# Patient Record
Sex: Female | Born: 1940
Health system: Southern US, Community
[De-identification: ages and names within clinical notes are randomized; demographics above are authoritative.]

## PROBLEM LIST (undated history)

## (undated) DIAGNOSIS — E669 Obesity, unspecified: Secondary | ICD-10-CM

## (undated) DIAGNOSIS — Z860100 Personal history of colon polyps, unspecified: Secondary | ICD-10-CM

## (undated) DIAGNOSIS — K5790 Diverticulosis of intestine, part unspecified, without perforation or abscess without bleeding: Secondary | ICD-10-CM

## (undated) DIAGNOSIS — E785 Hyperlipidemia, unspecified: Secondary | ICD-10-CM

## (undated) DIAGNOSIS — D122 Benign neoplasm of ascending colon: Secondary | ICD-10-CM

## (undated) DIAGNOSIS — K219 Gastro-esophageal reflux disease without esophagitis: Secondary | ICD-10-CM

## (undated) DIAGNOSIS — K635 Polyp of colon: Secondary | ICD-10-CM

## (undated) DIAGNOSIS — Z9289 Personal history of other medical treatment: Secondary | ICD-10-CM

## (undated) DIAGNOSIS — Z9989 Dependence on other enabling machines and devices: Secondary | ICD-10-CM

## (undated) DIAGNOSIS — E119 Type 2 diabetes mellitus without complications: Secondary | ICD-10-CM

## (undated) DIAGNOSIS — K602 Anal fissure, unspecified: Secondary | ICD-10-CM

## (undated) DIAGNOSIS — I10 Essential (primary) hypertension: Secondary | ICD-10-CM

## (undated) DIAGNOSIS — K5730 Diverticulosis of large intestine without perforation or abscess without bleeding: Secondary | ICD-10-CM

## (undated) DIAGNOSIS — N390 Urinary tract infection, site not specified: Secondary | ICD-10-CM

## (undated) DIAGNOSIS — G4733 Obstructive sleep apnea (adult) (pediatric): Secondary | ICD-10-CM

## (undated) DIAGNOSIS — K649 Unspecified hemorrhoids: Secondary | ICD-10-CM

## (undated) DIAGNOSIS — R739 Hyperglycemia, unspecified: Secondary | ICD-10-CM

## (undated) DIAGNOSIS — G4730 Sleep apnea, unspecified: Secondary | ICD-10-CM

## (undated) DIAGNOSIS — M1990 Unspecified osteoarthritis, unspecified site: Secondary | ICD-10-CM

## (undated) DIAGNOSIS — E039 Hypothyroidism, unspecified: Secondary | ICD-10-CM

## (undated) DIAGNOSIS — K439 Ventral hernia without obstruction or gangrene: Secondary | ICD-10-CM

## (undated) HISTORY — DX: Benign neoplasm of ascending colon: D12.2

## (undated) HISTORY — DX: Personal history of colon polyps, unspecified: Z86.0100

## (undated) HISTORY — DX: Hyperglycemia, unspecified: R73.9

## (undated) HISTORY — DX: Obstructive sleep apnea (adult) (pediatric): G47.33

## (undated) HISTORY — DX: Obesity, unspecified: E66.9

## (undated) HISTORY — DX: Hyperlipidemia, unspecified: E78.5

## (undated) HISTORY — DX: Gastro-esophageal reflux disease without esophagitis: K21.9

## (undated) HISTORY — DX: Anal fissure, unspecified: K60.2

## (undated) HISTORY — DX: Polyp of colon: K63.5

## (undated) HISTORY — DX: Dependence on other enabling machines and devices: Z99.89

## (undated) HISTORY — DX: Urinary tract infection, site not specified: N39.0

## (undated) HISTORY — PX: COLONOSCOPY: SHX174

## (undated) HISTORY — DX: Type 2 diabetes mellitus without complications: E11.9

## (undated) HISTORY — DX: Diverticulosis of large intestine without perforation or abscess without bleeding: K57.30

## (undated) HISTORY — DX: Personal history of colonic polyps: Z86.010

## (undated) HISTORY — PX: TOTAL HIP ARTHROPLASTY: SHX124

## (undated) HISTORY — PX: JOINT REPLACEMENT: SHX530

## (undated) HISTORY — DX: Diverticulosis of intestine, part unspecified, without perforation or abscess without bleeding: K57.90

## (undated) HISTORY — DX: Unspecified osteoarthritis, unspecified site: M19.90

## (undated) HISTORY — DX: Essential (primary) hypertension: I10

## (undated) HISTORY — PX: TUBAL LIGATION: SHX77

---

## 2015-01-15 ENCOUNTER — Ambulatory Visit (INDEPENDENT_AMBULATORY_CARE_PROVIDER_SITE_OTHER): Payer: Medicare Other | Admitting: Family Medicine

## 2015-01-15 ENCOUNTER — Encounter: Payer: Self-pay | Admitting: Family Medicine

## 2015-01-15 VITALS — BP 160/75 | HR 70 | Temp 98.1°F | Resp 16 | Ht 63.0 in | Wt 269.0 lb

## 2015-01-15 DIAGNOSIS — I159 Secondary hypertension, unspecified: Secondary | ICD-10-CM

## 2015-01-15 DIAGNOSIS — M1990 Unspecified osteoarthritis, unspecified site: Secondary | ICD-10-CM | POA: Diagnosis not present

## 2015-01-15 DIAGNOSIS — E785 Hyperlipidemia, unspecified: Secondary | ICD-10-CM | POA: Diagnosis not present

## 2015-01-15 DIAGNOSIS — I10 Essential (primary) hypertension: Secondary | ICD-10-CM

## 2015-01-15 DIAGNOSIS — G4733 Obstructive sleep apnea (adult) (pediatric): Secondary | ICD-10-CM

## 2015-01-15 DIAGNOSIS — E669 Obesity, unspecified: Secondary | ICD-10-CM | POA: Diagnosis not present

## 2015-01-15 DIAGNOSIS — Z6841 Body Mass Index (BMI) 40.0 and over, adult: Secondary | ICD-10-CM

## 2015-01-15 DIAGNOSIS — R739 Hyperglycemia, unspecified: Secondary | ICD-10-CM

## 2015-01-15 DIAGNOSIS — R7309 Other abnormal glucose: Secondary | ICD-10-CM | POA: Diagnosis not present

## 2015-01-15 DIAGNOSIS — E1169 Type 2 diabetes mellitus with other specified complication: Secondary | ICD-10-CM | POA: Insufficient documentation

## 2015-01-15 DIAGNOSIS — K219 Gastro-esophageal reflux disease without esophagitis: Secondary | ICD-10-CM | POA: Diagnosis not present

## 2015-01-15 HISTORY — DX: Obesity, unspecified: E66.9

## 2015-01-15 HISTORY — DX: Hyperglycemia, unspecified: R73.9

## 2015-01-15 HISTORY — DX: Essential (primary) hypertension: I10

## 2015-01-15 HISTORY — DX: Unspecified osteoarthritis, unspecified site: M19.90

## 2015-01-15 MED ORDER — AMLODIPINE BESYLATE 10 MG PO TABS: 10.0000 mg | ORAL_TABLET | Freq: Every day | ORAL | Status: DC

## 2015-01-15 MED ORDER — CARVEDILOL 25 MG PO TABS: 25.0000 mg | ORAL_TABLET | Freq: Two times a day (BID) | ORAL | Status: DC

## 2015-01-15 MED ORDER — VALSARTAN-HYDROCHLOROTHIAZIDE 320-12.5 MG PO TABS: 1.0000 | ORAL_TABLET | Freq: Every day | ORAL | Status: DC

## 2015-01-15 MED ORDER — OMEPRAZOLE 20 MG PO CPDR: 20.0000 mg | DELAYED_RELEASE_CAPSULE | Freq: Every day | ORAL | Status: DC

## 2015-01-15 MED ORDER — ATORVASTATIN CALCIUM 20 MG PO TABS: 20.0000 mg | ORAL_TABLET | Freq: Every day | ORAL | Status: DC

## 2015-01-15 MED ORDER — DOXAZOSIN MESYLATE 8 MG PO TABS: 8.0000 mg | ORAL_TABLET | Freq: Every day | ORAL | Status: DC

## 2015-01-15 NOTE — Progress Notes (Signed)
Name: Taylor Shaw   MRN: [id number]    DOB: November 26, 1940   Date:01/15/2015       Progress Note  Subjective  Chief Complaint  Chief Complaint  Patient presents with  . Establish Care    HPI  Here to establish care.  Formerly lived in [place].  Has HBP, elevated lipids, elevated blood sugar, obesity.  No c/o.  Taking all meds. No problem-specific assessment & plan notes found for this encounter.   Past Medical History  Diagnosis Date  . Hypertension   . Arthritis   . GERD (gastroesophageal reflux disease)   . Hyperlipidemia     Past Surgical History  Procedure Laterality Date  . Total hip arthroplasty Bilateral   . Tubal ligation    . Colonoscopy      Family History  Problem Relation Age of Onset  . Dementia Mother   . Hypertension Mother   . Arthritis Mother   . Cancer Father     lung  . Cancer Daughter     breast    Social History   Social History  . Marital Status: Divorced    Spouse Name: N/A  . Number of Children: N/A  . Years of Education: N/A   Occupational History  . Not on file.   Social History Main Topics  . Smoking status: Never Smoker   . Smokeless tobacco: Never Used  . Alcohol Use: No  . Drug Use: No  . Sexual Activity: Not on file   Other Topics Concern  . Not on file   Social History Narrative  . No narrative on file     Current outpatient prescriptions:  .  amLODipine (NORVASC) 10 MG tablet, Take 1 tablet (10 mg total) by mouth daily., Disp: 90 tablet, Rfl: 3 .  atorvastatin (LIPITOR) 20 MG tablet, Take 1 tablet (20 mg total) by mouth daily., Disp: 90 tablet, Rfl: 3 .  carvedilol (COREG) 25 MG tablet, Take 1 tablet (25 mg total) by mouth 2 (two) times daily with a meal., Disp: 180 tablet, Rfl: 3 .  Cholecalciferol (VITAMIN D) 2000 UNITS CAPS, Take 1 capsule by mouth daily., Disp: , Rfl:  .  docusate sodium (COLACE) 100 MG capsule, Take 100 mg by mouth at bedtime., Disp: , Rfl:  .  doxazosin (CARDURA) 8 MG tablet, Take 1  tablet (8 mg total) by mouth daily., Disp: 90 tablet, Rfl: 3 .  Omega-3 Fatty Acids (FISH OIL) 1000 MG CAPS, Take 1 capsule by mouth daily., Disp: , Rfl:  .  omeprazole (PRILOSEC) 20 MG capsule, Take 1 capsule (20 mg total) by mouth daily., Disp: 90 capsule, Rfl: 3 .  valsartan-hydrochlorothiazide (DIOVAN-HCT) 320-12.5 MG per tablet, Take 1 tablet by mouth daily., Disp: 90 tablet, Rfl: 3  No Known Allergies   Review of Systems  Constitutional: Negative for fever, chills, weight loss and malaise/fatigue.  HENT: Negative for hearing loss.   Eyes: Negative for blurred vision and double vision.  Respiratory: Negative for cough, sputum production, shortness of breath and wheezing.   Cardiovascular: Negative for chest pain, palpitations, orthopnea and leg swelling.  Gastrointestinal: Negative for heartburn, nausea, vomiting, abdominal pain, diarrhea and blood in stool.  Genitourinary: Negative for dysuria, urgency and frequency.  Musculoskeletal: Negative for myalgias and joint pain.  Skin: Negative for rash.  Neurological: Negative for dizziness, tingling, sensory change, focal weakness, weakness and headaches.      Objective  Filed Vitals:   01/15/15 0939 01/15/15 1037  BP: 124/70 160/75  Pulse:  70   Temp: 98.1 F (36.7 C)   TempSrc: Oral   Resp: 16   Height: 5\' 3"  (1.6 m)   Weight: 269 lb (122.018 kg)     Physical Exam  Constitutional: She is oriented to person, place, and time and well-developed, well-nourished, and in no distress. No distress.  HENT:  Head: Normocephalic and atraumatic.  Eyes: Conjunctivae and EOM are normal. Pupils are equal, round, and reactive to light. No scleral icterus.  Neck: Normal range of motion. Neck supple. Carotid bruit is not present. No thyromegaly present.  Cardiovascular: Normal rate, regular rhythm, normal heart sounds and intact distal pulses.  Exam reveals no gallop and no friction rub.   No murmur heard. Pulmonary/Chest: Breath sounds  normal. No respiratory distress. She has no wheezes. She has no rales.  Abdominal: Soft. Bowel sounds are normal. She exhibits no distension, no abdominal bruit and no mass. There is no tenderness.  Musculoskeletal: She exhibits edema (trace pedal edema bilaterally).  Lymphadenopathy:    She has no cervical adenopathy.  Neurological: She is alert and oriented to person, place, and time.  Skin: Skin is warm and dry.       No results found for this or any previous visit (from the past 2160 hour(s)).   Assessment & Plan  Problem List Items Addressed This Visit      Cardiovascular and Mediastinum   Hypertension - Primary   Relevant Medications   amLODipine (NORVASC) 10 MG tablet   atorvastatin (LIPITOR) 20 MG tablet   carvedilol (COREG) 25 MG tablet   doxazosin (CARDURA) 8 MG tablet   valsartan-hydrochlorothiazide (DIOVAN-HCT) 320-12.5 MG per tablet   Other Relevant Orders   Comprehensive Metabolic Panel (CMET)     Musculoskeletal and Integument   Arthritis     Other   Hyperlipidemia   Relevant Medications   amLODipine (NORVASC) 10 MG tablet   atorvastatin (LIPITOR) 20 MG tablet   carvedilol (COREG) 25 MG tablet   doxazosin (CARDURA) 8 MG tablet   valsartan-hydrochlorothiazide (DIOVAN-HCT) 320-12.5 MG per tablet   Obesity   Relevant Orders   Lipid Profile   TSH   Elevated blood sugar   Relevant Orders   HgB A1c    Other Visit Diagnoses    Gastroesophageal reflux disease without esophagitis        Relevant Medications    docusate sodium (COLACE) 100 MG capsule    omeprazole (PRILOSEC) 20 MG capsule    Sleep apnea, obstructive           Meds ordered this encounter  Medications  . DISCONTD: atorvastatin (LIPITOR) 20 MG tablet    Sig: Take 20 mg by mouth daily.  Marland Kitchen DISCONTD: carvedilol (COREG) 25 MG tablet    Sig: Take 25 mg by mouth 2 (two) times daily with a meal.  . DISCONTD: amLODipine (NORVASC) 10 MG tablet    Sig: Take 10 mg by mouth daily.  Marland Kitchen DISCONTD:  valsartan-hydrochlorothiazide (DIOVAN-HCT) 320-12.5 MG per tablet    Sig: Take 1 tablet by mouth daily.  . Cholecalciferol (VITAMIN D) 2000 UNITS CAPS    Sig: Take 1 capsule by mouth daily.  Marland Kitchen DISCONTD: omeprazole (PRILOSEC) 20 MG capsule    Sig: Take 20 mg by mouth daily.  . Omega-3 Fatty Acids (FISH OIL) 1000 MG CAPS    Sig: Take 1 capsule by mouth daily.  Marland Kitchen DISCONTD: doxazosin (CARDURA) 8 MG tablet    Sig: Take 8 mg by mouth daily.  Marland Kitchen docusate sodium (COLACE)  100 MG capsule    Sig: Take 100 mg by mouth at bedtime.  Marland Kitchen amLODipine (NORVASC) 10 MG tablet    Sig: Take 1 tablet (10 mg total) by mouth daily.    Dispense:  90 tablet    Refill:  3  . atorvastatin (LIPITOR) 20 MG tablet    Sig: Take 1 tablet (20 mg total) by mouth daily.    Dispense:  90 tablet    Refill:  3  . carvedilol (COREG) 25 MG tablet    Sig: Take 1 tablet (25 mg total) by mouth 2 (two) times daily with a meal.    Dispense:  180 tablet    Refill:  3  . doxazosin (CARDURA) 8 MG tablet    Sig: Take 1 tablet (8 mg total) by mouth daily.    Dispense:  90 tablet    Refill:  3  . omeprazole (PRILOSEC) 20 MG capsule    Sig: Take 1 capsule (20 mg total) by mouth daily.    Dispense:  90 capsule    Refill:  3  . valsartan-hydrochlorothiazide (DIOVAN-HCT) 320-12.5 MG per tablet    Sig: Take 1 tablet by mouth daily.    Dispense:  90 tablet    Refill:  3   1. Secondary hypertension, unspecified  - Comprehensive Metabolic Panel (CMET) - amLODipine (NORVASC) 10 MG tablet; Take 1 tablet (10 mg total) by mouth daily.  Dispense: 90 tablet; Refill: 3 - carvedilol (COREG) 25 MG tablet; Take 1 tablet (25 mg total) by mouth 2 (two) times daily with a meal.  Dispense: 180 tablet; Refill: 3 - doxazosin (CARDURA) 8 MG tablet; Take 1 tablet (8 mg total) by mouth daily.  Dispense: 90 tablet; Refill: 3 - valsartan-hydrochlorothiazide (DIOVAN-HCT) 320-12.5 MG per tablet; Take 1 tablet by mouth daily.  Dispense: 90 tablet; Refill:  3  2. Arthritis   3. Hyperlipidemia  - atorvastatin (LIPITOR) 20 MG tablet; Take 1 tablet (20 mg total) by mouth daily.  Dispense: 90 tablet; Refill: 3  4. Obesity  - Lipid Profile - TSH  5. Elevated blood sugar  - HgB A1c  6. Gastroesophageal reflux disease without esophagitis  - omeprazole (PRILOSEC) 20 MG capsule; Take 1 capsule (20 mg total) by mouth daily.  Dispense: 90 capsule; Refill: 3  7. Sleep apnea, obstructive

## 2015-01-15 NOTE — Patient Instructions (Signed)
Discussed weight loss and exercise.  Restart Amlodipine and Omeprazole (out olf each x 1 week).

## 2015-01-19 DIAGNOSIS — I159 Secondary hypertension, unspecified: Secondary | ICD-10-CM | POA: Diagnosis not present

## 2015-01-19 DIAGNOSIS — R7309 Other abnormal glucose: Secondary | ICD-10-CM | POA: Diagnosis not present

## 2015-01-19 DIAGNOSIS — E669 Obesity, unspecified: Secondary | ICD-10-CM | POA: Diagnosis not present

## 2015-01-20 LAB — COMPREHENSIVE METABOLIC PANEL
A/G RATIO: 1.3 (ref 1.1–2.5)
ALBUMIN: 3.8 g/dL (ref 3.5–4.8)
ALT: 8 IU/L (ref 0–32)
AST: 14 IU/L (ref 0–40)
Alkaline Phosphatase: 72 IU/L (ref 39–117)
BILIRUBIN TOTAL: 0.7 mg/dL (ref 0.0–1.2)
BUN / CREAT RATIO: 12 (ref 11–26)
BUN: 9 mg/dL (ref 8–27)
CO2: 28 mmol/L (ref 18–29)
Calcium: 8.9 mg/dL (ref 8.7–10.3)
Chloride: 100 mmol/L (ref 97–108)
Creatinine, Ser: 0.73 mg/dL (ref 0.57–1.00)
GFR calc non Af Amer: 83 mL/min/{1.73_m2} (ref >59)
GFR, EST AFRICAN AMERICAN: 95 mL/min/{1.73_m2} (ref >59)
Globulin, Total: 2.9 g/dL (ref 1.5–4.5)
Glucose: 112 mg/dL — ABNORMAL HIGH (ref 65–99)
POTASSIUM: 4 mmol/L (ref 3.5–5.2)
Sodium: 143 mmol/L (ref 134–144)
TOTAL PROTEIN: 6.7 g/dL (ref 6.0–8.5)

## 2015-01-20 LAB — LIPID PANEL
Chol/HDL Ratio: 2.1 ratio units (ref 0.0–4.4)
Cholesterol, Total: 103 mg/dL (ref 100–199)
HDL: 50 mg/dL (ref >39)
LDL Calculated: 42 mg/dL (ref 0–99)
Triglycerides: 54 mg/dL (ref 0–149)
VLDL CHOLESTEROL CAL: 11 mg/dL (ref 5–40)

## 2015-01-20 LAB — HEMOGLOBIN A1C
ESTIMATED AVERAGE GLUCOSE: 146 mg/dL
Hgb A1c MFr Bld: 6.7 % — ABNORMAL HIGH (ref 4.8–5.6)

## 2015-01-20 LAB — TSH: TSH: 2.65 u[IU]/mL (ref 0.450–4.500)

## 2015-02-19 ENCOUNTER — Ambulatory Visit (INDEPENDENT_AMBULATORY_CARE_PROVIDER_SITE_OTHER): Payer: Medicare Other | Admitting: Family Medicine

## 2015-02-19 ENCOUNTER — Encounter: Payer: Self-pay | Admitting: Family Medicine

## 2015-02-19 VITALS — BP 124/75 | HR 66 | Temp 98.0°F | Resp 16 | Ht 63.0 in | Wt 268.8 lb

## 2015-02-19 DIAGNOSIS — E785 Hyperlipidemia, unspecified: Secondary | ICD-10-CM

## 2015-02-19 DIAGNOSIS — I10 Essential (primary) hypertension: Secondary | ICD-10-CM | POA: Diagnosis not present

## 2015-02-19 DIAGNOSIS — E669 Obesity, unspecified: Secondary | ICD-10-CM

## 2015-02-19 DIAGNOSIS — Z23 Encounter for immunization: Secondary | ICD-10-CM

## 2015-02-19 DIAGNOSIS — E119 Type 2 diabetes mellitus without complications: Secondary | ICD-10-CM

## 2015-02-19 DIAGNOSIS — E118 Type 2 diabetes mellitus with unspecified complications: Secondary | ICD-10-CM | POA: Insufficient documentation

## 2015-02-19 HISTORY — DX: Type 2 diabetes mellitus without complications: E11.9

## 2015-02-19 MED ORDER — METFORMIN HCL 500 MG PO TABS: 500.0000 mg | ORAL_TABLET | Freq: Every day | ORAL | Status: DC

## 2015-02-19 MED ORDER — ATORVASTATIN CALCIUM 10 MG PO TABS: 10.0000 mg | ORAL_TABLET | Freq: Every day | ORAL | Status: DC

## 2015-02-19 NOTE — Patient Instructions (Addendum)
Continue to take current meds except where changed.  To get HD flu shot today.

## 2015-02-19 NOTE — Progress Notes (Signed)
Name: Taylor Shaw   MRN: [id number]    DOB: Jul 06, 1940   Date:02/19/2015       Progress Note  Subjective  Chief Complaint  Chief Complaint  Patient presents with  . Hypertension    1 month follow up    Hypertension Pertinent negatives include no blurred vision, chest pain, headaches, malaise/fatigue, orthopnea, palpitations or shortness of breath.  Here for f/u of HBP.  Taking all meds and feeling overall well.  Only lost minimal weight.  Labs indicated A1c of 6.7.  She is diabetic without sig. Weight loss.    Her lipids are very low.   No problem-specific assessment & plan notes found for this encounter.   Past Medical History  Diagnosis Date  . Hypertension   . Arthritis   . GERD (gastroesophageal reflux disease)   . Hyperlipidemia     Social History  Substance Use Topics  . Smoking status: Never Smoker   . Smokeless tobacco: Never Used  . Alcohol Use: No     Current outpatient prescriptions:  .  amLODipine (NORVASC) 10 MG tablet, Take 1 tablet (10 mg total) by mouth daily., Disp: 90 tablet, Rfl: 3 .  atorvastatin (LIPITOR) 20 MG tablet, Take 1 tablet (20 mg total) by mouth daily., Disp: 90 tablet, Rfl: 3 .  carvedilol (COREG) 25 MG tablet, Take 1 tablet (25 mg total) by mouth 2 (two) times daily with a meal., Disp: 180 tablet, Rfl: 3 .  Cholecalciferol (VITAMIN D) 2000 UNITS CAPS, Take 1 capsule by mouth daily., Disp: , Rfl:  .  docusate sodium (COLACE) 100 MG capsule, Take 100 mg by mouth at bedtime., Disp: , Rfl:  .  doxazosin (CARDURA) 8 MG tablet, Take 1 tablet (8 mg total) by mouth daily., Disp: 90 tablet, Rfl: 3 .  Omega-3 Fatty Acids (FISH OIL) 1000 MG CAPS, Take 1 capsule by mouth daily., Disp: , Rfl:  .  omeprazole (PRILOSEC) 20 MG capsule, Take 1 capsule (20 mg total) by mouth daily., Disp: 90 capsule, Rfl: 3 .  valsartan-hydrochlorothiazide (DIOVAN-HCT) 320-12.5 MG per tablet, Take 1 tablet by mouth daily., Disp: 90 tablet, Rfl: 3  No Known  Allergies  Review of Systems  Constitutional: Negative for fever, chills, weight loss and malaise/fatigue.  HENT: Negative for hearing loss.   Eyes: Negative for blurred vision and double vision.  Respiratory: Negative for cough, sputum production, shortness of breath and wheezing.   Cardiovascular: Negative for chest pain, palpitations, orthopnea and leg swelling.  Gastrointestinal: Negative for heartburn, abdominal pain and blood in stool.  Genitourinary: Negative for dysuria, urgency and frequency.  Musculoskeletal: Negative for myalgias and joint pain.  Skin: Negative for rash.  Neurological: Negative for dizziness, sensory change, focal weakness, weakness and headaches.      Objective  Filed Vitals:   02/19/15 0928  BP: 124/75  Pulse: 51  Temp: 98 F (36.7 C)  TempSrc: Oral  Resp: 16  Height: 5\' 3"  (1.6 m)  Weight: 268 lb 12.8 oz (121.927 kg)     Physical Exam  Constitutional: She is oriented to person, place, and time and well-developed, well-nourished, and in no distress. No distress.  HENT:  Head: Normocephalic and atraumatic.  Eyes: Conjunctivae and EOM are normal. Pupils are equal, round, and reactive to light. No scleral icterus.  Neck: Normal range of motion. Neck supple. Carotid bruit is not present. No thyromegaly present.  Cardiovascular: Normal rate, regular rhythm, normal heart sounds and intact distal pulses.  Exam reveals no gallop and  no friction rub.   No murmur heard. Pulmonary/Chest: Effort normal and breath sounds normal. No respiratory distress. She has no wheezes. She has no rales.  Abdominal: Soft. Bowel sounds are normal. She exhibits no distension, no abdominal bruit and no mass. There is no tenderness.  Musculoskeletal: She exhibits edema (trace pedal edema bilaterally.).  Lymphadenopathy:    She has no cervical adenopathy.  Neurological: She is alert and oriented to person, place, and time.      Recent Results (from the past 2160  hour(s))  Comprehensive Metabolic Panel (CMET)     Status: Abnormal   Collection Time: 01/19/15  8:11 AM  Result Value Ref Range   Glucose 112 (H) 65 - 99 mg/dL   BUN 9 8 - 27 mg/dL   Creatinine, Ser 0.73 0.57 - 1.00 mg/dL   GFR calc non Af Amer 83 >59 mL/min/1.73   GFR calc Af Amer 95 >59 mL/min/1.73   BUN/Creatinine Ratio 12 11 - 26   Sodium 143 134 - 144 mmol/L   Potassium 4.0 3.5 - 5.2 mmol/L   Chloride 100 97 - 108 mmol/L   CO2 28 18 - 29 mmol/L   Calcium 8.9 8.7 - 10.3 mg/dL   Total Protein 6.7 6.0 - 8.5 g/dL   Albumin 3.8 3.5 - 4.8 g/dL   Globulin, Total 2.9 1.5 - 4.5 g/dL   Albumin/Globulin Ratio 1.3 1.1 - 2.5   Bilirubin Total 0.7 0.0 - 1.2 mg/dL   Alkaline Phosphatase 72 39 - 117 IU/L   AST 14 0 - 40 IU/L   ALT 8 0 - 32 IU/L  Lipid Profile     Status: None   Collection Time: 01/19/15  8:11 AM  Result Value Ref Range   Cholesterol, Total 103 100 - 199 mg/dL   Triglycerides 54 0 - 149 mg/dL   HDL 50 >39 mg/dL    Comment: According to ATP-III Guidelines, HDL-C >59 mg/dL is considered a negative risk factor for CHD.    VLDL Cholesterol Cal 11 5 - 40 mg/dL   LDL Calculated 42 0 - 99 mg/dL   Chol/HDL Ratio 2.1 0.0 - 4.4 ratio units    Comment:                                   T. Chol/HDL Ratio                                             Men  Women                               1/2 Avg.Risk  3.4    3.3                                   Avg.Risk  5.0    4.4                                2X Avg.Risk  9.6    7.1  3X Avg.Risk 23.4   11.0   HgB A1c     Status: Abnormal   Collection Time: 01/19/15  8:11 AM  Result Value Ref Range   Hgb A1c MFr Bld 6.7 (H) 4.8 - 5.6 %    Comment:          Pre-diabetes: 5.7 - 6.4          Diabetes: >6.4          Glycemic control for adults with diabetes: <7.0    Est. average glucose Bld gHb Est-mCnc 146 mg/dL  TSH     Status: None   Collection Time: 01/19/15  8:11 AM  Result Value Ref Range   TSH 2.650  0.450 - 4.500 uIU/mL     Assessment & Plan  1. Essential hypertension -continue current meds 2. Hyperlipidemia  - atorvastatin (LIPITOR) 10 MG tablet; Take 1 tablet (10 mg total) by mouth daily.  Dispense: 90 tablet; Refill: 3  3. Obesity -discussed calorie reduction and weight loss. 4. Type 2 diabetes mellitus without complication  - metFORMIN (GLUCOPHAGE) 500 MG tablet; Take 1 tablet (500 mg total) by mouth daily with breakfast.  Dispense: 30 tablet; Refill: 6

## 2015-03-27 ENCOUNTER — Emergency Department: Payer: Medicare Other

## 2015-03-27 ENCOUNTER — Emergency Department
Admission: EM | Admit: 2015-03-27 | Discharge: 2015-03-27 | Disposition: A | Discharge status: Home / Self Care | Payer: Medicare Other | Attending: Emergency Medicine | Admitting: Emergency Medicine

## 2015-03-27 ENCOUNTER — Encounter: Payer: Self-pay | Admitting: Emergency Medicine

## 2015-03-27 DIAGNOSIS — Z7984 Long term (current) use of oral hypoglycemic drugs: Secondary | ICD-10-CM | POA: Diagnosis not present

## 2015-03-27 DIAGNOSIS — M5416 Radiculopathy, lumbar region: Secondary | ICD-10-CM | POA: Diagnosis not present

## 2015-03-27 DIAGNOSIS — M4726 Other spondylosis with radiculopathy, lumbar region: Secondary | ICD-10-CM | POA: Insufficient documentation

## 2015-03-27 DIAGNOSIS — I10 Essential (primary) hypertension: Secondary | ICD-10-CM | POA: Insufficient documentation

## 2015-03-27 DIAGNOSIS — M1991 Primary osteoarthritis, unspecified site: Secondary | ICD-10-CM | POA: Diagnosis not present

## 2015-03-27 DIAGNOSIS — Z79899 Other long term (current) drug therapy: Secondary | ICD-10-CM | POA: Diagnosis not present

## 2015-03-27 DIAGNOSIS — M25551 Pain in right hip: Secondary | ICD-10-CM | POA: Diagnosis not present

## 2015-03-27 DIAGNOSIS — M5136 Other intervertebral disc degeneration, lumbar region: Secondary | ICD-10-CM | POA: Diagnosis not present

## 2015-03-27 DIAGNOSIS — M545 Low back pain: Secondary | ICD-10-CM | POA: Diagnosis not present

## 2015-03-27 DIAGNOSIS — M549 Dorsalgia, unspecified: Secondary | ICD-10-CM | POA: Diagnosis not present

## 2015-03-27 DIAGNOSIS — M5116 Intervertebral disc disorders with radiculopathy, lumbar region: Secondary | ICD-10-CM | POA: Diagnosis not present

## 2015-03-27 MED ORDER — OXYCODONE-ACETAMINOPHEN 5-325 MG PO TABS
1.0000 | ORAL_TABLET | Freq: Once | ORAL | Status: AC
  Administered 2015-03-27: 1 via ORAL
  Filled 2015-03-27: qty 1

## 2015-03-27 MED ORDER — PREDNISONE 10 MG PO TABS: ORAL_TABLET | ORAL | Status: DC

## 2015-03-27 MED ORDER — OXYCODONE-ACETAMINOPHEN 5-325 MG PO TABS: 1.0000 | ORAL_TABLET | ORAL | Status: AC | PRN

## 2015-03-27 MED ORDER — PREDNISONE 20 MG PO TABS
60.0000 mg | ORAL_TABLET | Freq: Once | ORAL | Status: AC
  Administered 2015-03-27: 60 mg via ORAL
  Filled 2015-03-27: qty 3

## 2015-03-27 MED ORDER — MORPHINE SULFATE (PF) 4 MG/ML IV SOLN
4.0000 mg | Freq: Once | INTRAVENOUS | Status: AC
  Administered 2015-03-27: 4 mg via INTRAMUSCULAR
  Filled 2015-03-27: qty 1

## 2015-03-27 NOTE — Discharge Instructions (Signed)
Degenerative Disk Disease Degenerative disk disease is a condition caused by the changes that occur in spinal disks as you grow older. Spinal disks are soft and compressible disks located between the bones of your spine (vertebrae). These disks act like shock absorbers. Degenerative disk disease can affect the whole spine. However, the neck and lower back are most commonly affected. Many changes can occur in the spinal disks with aging, such as:  The spinal disks may dry and shrink.  Small tears may occur in the tough, outer covering of the disk (annulus).  The disk space may become smaller due to loss of water.  Abnormal growths in the bone (spurs) may occur. This can put pressure on the nerve roots exiting the spinal canal, causing pain.  The spinal canal may become narrowed. RISK FACTORS   Being overweight.  Having a family history of degenerative disk disease.  Smoking.  There is increased risk if you are doing heavy lifting or have a sudden injury. SIGNS AND SYMPTOMS  Symptoms vary from person to person and may include:  Pain that varies in intensity. Some people have no pain, while others have severe pain. The location of the pain depends on the part of your backbone that is affected.  You will have neck or arm pain if a disk in the neck area is affected.  You will have pain in your back, buttocks, or legs if a disk in the lower back is affected.  Pain that becomes worse while bending, reaching up, or with twisting movements.  Pain that may start gradually and then get worse as time passes. It may also start after a major or minor injury.  Numbness or tingling in the arms or legs. DIAGNOSIS  Your health care provider will ask you about your symptoms and about activities or habits that may cause the pain. He or she may also ask about any injuries, diseases, or treatments you have had. Your health care provider will examine you to check for the range of movement that is  possible in the affected area, to check for strength in your extremities, and to check for sensation in the areas of the arms and legs supplied by different nerve roots. You may also have:   An X-ray of the spine.  Other imaging tests, such as MRI. TREATMENT  Your health care provider will advise you on the best plan for treatment. Treatment may include:  Medicines.  Rehabilitation exercises. HOME CARE INSTRUCTIONS   Follow proper lifting and walking techniques as advised by your health care provider.  Maintain good posture.  Exercise regularly as advised by your health care provider.  Perform relaxation exercises.  Change your sitting, standing, and sleeping habits as advised by your health care provider.  Change positions frequently.  Lose weight or maintain a healthy weight as advised by your health care provider.  Do not use any tobacco products, including cigarettes, chewing tobacco, or electronic cigarettes. If you need help quitting, ask your health care provider.  Wear supportive footwear.  Take medicines only as directed by your health care provider. SEEK MEDICAL CARE IF:   Your pain does not go away within 1-4 weeks.  You have significant appetite or weight loss. SEEK IMMEDIATE MEDICAL CARE IF:   Your pain is severe.  You notice weakness in your arms, hands, or legs.  You begin to lose control of your bladder or bowel movements.  You have fevers or night sweats. MAKE SURE YOU:   Understand these  instructions.  Will watch your condition.  Will get help right away if you are not doing well or get worse.   This information is not intended to replace advice given to you by your health care provider. Make sure you discuss any questions you have with your health care provider.   Document Released: 03/19/2007 Document Revised: 06/12/2014 Document Reviewed: 09/23/2013 Elsevier Interactive Patient Education 2016 [employer]  Lumbosacral  Radiculopathy Lumbosacral radiculopathy is a condition that involves the spinal nerves and nerve roots in the low back and bottom of the spine. The condition develops when these nerves and nerve roots move out of place or become inflamed and cause symptoms. CAUSES This condition may be caused by:  Pressure from a disk that bulges out of place (herniated disk). A disk is a plate of cartilage that separates bones in the spine.  Disk degeneration.  A narrowing of the bones of the lower back (spinal stenosis).  A tumor.  An infection.  An injury that places sudden pressure on the disks that cushion the bones of your lower spine. RISK FACTORS This condition is more likely to develop in:  Males aged 30-50 years.  Females aged 29-60 years.  People who lift improperly.  People who are overweight or live a sedentary lifestyle.  People who smoke.  People who perform repetitive activities that strain the spine. SYMPTOMS Symptoms of this condition include:  Pain that goes down from the back into the legs (sciatica). This is the most common symptom. The pain may be worse with sitting, coughing, or sneezing.  Pain and numbness in the arms and legs.  Muscle weakness.  Tingling.  Loss of bladder control or bowel control. DIAGNOSIS This condition is diagnosed with a physical exam and medical history. If the pain is lasting, you may have tests, such as:  MRI scan.  X-ray.  CT scan.  Myelogram.  Nerve conduction study. TREATMENT This condition is often treated with:  Hot packs and ice applied to affected areas.  Stretches to improve flexibility.  Exercises to strengthen back muscles.  Physical therapy.  Pain medicine.  A steroid injection in the spine. In some cases, no treatment is needed. If the condition is long-lasting (chronic), or if symptoms are severe, treatment may involve surgery or lifestyle changes, such as following a weight loss plan. HOME CARE  INSTRUCTIONS Medicines  Take medicines only as directed by your health care provider.  Do not drive or operate heavy machinery while taking pain medicine. Injury Care  Apply a heat pack to the injured area as directed by your health care provider.  Apply ice to the affected area:  Put ice in a plastic bag.  Place a towel between your skin and the bag.  Leave the ice on for 20-30 minutes, every 2 hours while you are awake or as needed. Or, leave the ice on for as long as directed by your health care provider. Other Instructions  If you were shown how to do any exercises or stretches, do them as directed by your health care provider.  If your health care provider prescribed a diet or exercise program, follow it as directed.  Keep all follow-up visits as directed by your health care provider. This is important. SEEK MEDICAL CARE IF:  Your pain does not improve over time even when taking pain medicines. SEEK IMMEDIATE MEDICAL CARE IF:  Your develop severe pain.  Your pain suddenly gets worse.  You develop increasing weakness in your legs.  You  lose the ability to control your bladder or bowel.  You have difficulty walking or balancing.  You have a fever.   This information is not intended to replace advice given to you by your health care provider. Make sure you discuss any questions you have with your health care provider.   Document Released: 05/22/2005 Document Revised: 10/06/2014 Document Reviewed: 05/18/2014 Elsevier Interactive Patient Education [employer].

## 2015-03-27 NOTE — ED Notes (Addendum)
Pt to ED via EMS c/o lower R back pain radiating to groin and down to R knee x1 week. Denies injury.

## 2015-03-27 NOTE — ED Notes (Signed)
  Reviewed d/c instructions, follow-up care, and prescriptions with pt. Pt verbalized understanding 

## 2015-03-27 NOTE — ED Provider Notes (Signed)
CSN: [id number]     Arrival date & time 03/27/15  2025 History   First MD Initiated Contact with Patient 03/27/15 2043     Chief Complaint  Patient presents with  . Back Pain     (Consider location/radiation/quality/duration/timing/severity/associated sxs/prior Treatment) HPI  74 year old female presents to the emergency department for evaluation of right lower back pain radiating down her right leg. Symptoms been present for 7 days, progressively getting worse over the last 24 hours. No trauma or injury. She has a history of bilateral total hip arthroplasties performed back in 2004. She states she has a sharp pain in her right buttocks that radiates down her right lateral hip, right groin and anterior thigh to her knee. Pain is constant and increased with movement and activity. She has slight improvement of pain with Tylenol. Pain is currently a 10 out of 10. She denies any weakness or loss of bowel or bladder symptoms. No chest pain or shortness of breath.  Past Medical History  Diagnosis Date  . Hypertension   . Arthritis   . GERD (gastroesophageal reflux disease)   . Hyperlipidemia    Past Surgical History  Procedure Laterality Date  . Total hip arthroplasty Bilateral   . Tubal ligation    . Colonoscopy     Family History  Problem Relation Age of Onset  . Dementia Mother   . Hypertension Mother   . Arthritis Mother   . Cancer Father     lung  . Cancer Daughter     breast   Social History  Substance Use Topics  . Smoking status: Never Smoker   . Smokeless tobacco: Never Used  . Alcohol Use: No   OB History    No data available     Review of Systems  Constitutional: Negative for fever, chills, activity change and fatigue.  HENT: Negative for congestion, sinus pressure and sore throat.   Eyes: Negative for visual disturbance.  Respiratory: Negative for cough, chest tightness and shortness of breath.   Cardiovascular: Negative for chest pain and leg swelling.   Gastrointestinal: Negative for nausea, vomiting, abdominal pain and diarrhea.  Genitourinary: Negative for dysuria.  Musculoskeletal: Positive for back pain. Negative for arthralgias and gait problem.  Skin: Negative for rash.  Neurological: Negative for weakness, numbness and headaches.  Hematological: Negative for adenopathy.  Psychiatric/Behavioral: Negative for behavioral problems, confusion and agitation.      Allergies  Review of patient's allergies indicates no known allergies.  Home Medications   Prior to Admission medications   Medication Sig Start Date End Date Taking? Authorizing Provider  amLODipine (NORVASC) 10 MG tablet Take 1 tablet (10 mg total) by mouth daily. 01/15/15   Arlis Porta., MD  atorvastatin (LIPITOR) 10 MG tablet Take 1 tablet (10 mg total) by mouth daily. 02/19/15   Arlis Porta., MD  carvedilol (COREG) 25 MG tablet Take 1 tablet (25 mg total) by mouth 2 (two) times daily with a meal. 01/15/15   Arlis Porta., MD  Cholecalciferol (VITAMIN D) 2000 UNITS CAPS Take 1 capsule by mouth daily.    Historical Provider, MD  docusate sodium (COLACE) 100 MG capsule Take 100 mg by mouth at bedtime.    Historical Provider, MD  doxazosin (CARDURA) 8 MG tablet Take 1 tablet (8 mg total) by mouth daily. 01/15/15   Arlis Porta., MD  metFORMIN (GLUCOPHAGE) 500 MG tablet Take 1 tablet (500 mg total) by mouth daily with breakfast. 02/19/15  Arlis Porta., MD  Omega-3 Fatty Acids (FISH OIL) 1000 MG CAPS Take 1 capsule by mouth daily.    Historical Provider, MD  omeprazole (PRILOSEC) 20 MG capsule Take 1 capsule (20 mg total) by mouth daily. 01/15/15   Arlis Porta., MD  oxyCODONE-acetaminophen (ROXICET) 5-325 MG tablet Take 1 tablet by mouth every 4 (four) hours as needed. 03/27/15 03/26/16  Duanne Guess, PA-C  predniSONE (DELTASONE) 10 MG tablet 10 day taper. 5,5,4,4,3,3,2,2,1,1 03/27/15   Duanne Guess, PA-C  valsartan-hydrochlorothiazide  (DIOVAN-HCT) 320-12.5 MG per tablet Take 1 tablet by mouth daily. 01/15/15   Arlis Porta., MD   BP 144/54 mmHg  Pulse 57  Temp(Src) 97.8 F (36.6 C) (Oral)  Resp 17  SpO2 100% Physical Exam  Constitutional: She is oriented to person, place, and time. She appears well-developed and well-nourished. No distress.  HENT:  Head: Normocephalic and atraumatic.  Mouth/Throat: Oropharynx is clear and moist.  Eyes: EOM are normal. Pupils are equal, round, and reactive to light. Right eye exhibits no discharge. Left eye exhibits no discharge.  Neck: Normal range of motion. Neck supple.  Cardiovascular: Normal rate, regular rhythm and intact distal pulses.   Pulmonary/Chest: Effort normal and breath sounds normal. No respiratory distress. She exhibits no tenderness.  Abdominal: Soft. She exhibits no distension. There is no tenderness.  Musculoskeletal:  Examination of the lumbar spine shows patient has no spinous process tenderness. There is right paravertebral muscle tenderness. Patient is tender over the right SI joint. She has painful lumbar flexion and extension. She has full range of motion of the right hip with no significant discomfort with internal or external rotation. He is tender over the right trochanteric bursa. No active and passive range of motion of the knee and ankle with no neurological deficits throughout the right lower extremity.  Neurological: She is alert and oriented to person, place, and time. She has normal reflexes.  Skin: Skin is warm and dry.  Psychiatric: She has a normal mood and affect. Her behavior is normal. Thought content normal.    ED Course  Procedures (including critical care time) Labs Review Labs Reviewed - No data to display  Imaging Review Dg Lumbar Spine 2-3 Views  03/27/2015  CLINICAL DATA:  Lumbosacral back pain radiating to the right knee for 1 week. No known injury. EXAM: LUMBAR SPINE - 2-3 VIEW COMPARISON:  None. FINDINGS: The alignment is  maintained. Vertebral body heights are normal. There is no listhesis. The posterior elements are intact. No fracture. There is diffuse facet arthropathy from L2-L3 through the lumbosacral junction. Mild disc space narrowing at L3-L4. Multilevel endplate spurring. There is transitional lumbosacral anatomy. Sacroiliac joints are symmetric and normal. IMPRESSION: Multilevel facet arthropathy and mild degenerative disc disease in the lumbar spine. No acute bony abnormality. Electronically Signed   By: Jeb Levering M.D.   On: 03/27/2015 21:45   Dg Hip Unilat With Pelvis 1v Right  03/27/2015  CLINICAL DATA:  Right hip pain without known injury for 1 week. EXAM: DG HIP (WITH OR WITHOUT PELVIS) 1V RIGHT COMPARISON:  None. FINDINGS: There is no evidence of hip fracture or dislocation. Status post bilateral total hip arthroplasties. Sacroiliac joints appear normal. IMPRESSION: Status post bilateral total hip arthroplasties. No acute abnormality seen. Electronically Signed   By: Marijo Conception, M.D.   On: 03/27/2015 21:43   I have personally reviewed and evaluated these images and lab results as part of my medical decision-making.  EKG Interpretation None      MDM   Final diagnoses:  Right lumbar radiculopathy  Osteoarthritis of spine with radiculopathy, lumbar region  Degenerative disc disease, lumbar    74 year old female with low back pain and right lumbar radiculopathy. No weakness or neurological deficits on exam. X-ray of the lumbar spine and right total hip arthroplasty negative for any acute bony abnormality. She was given 60 mg of prednisone orally and 4 mg of morphine IM. She had improved lower back and right sided radicular symptoms at time of discharge. She was sent home with a 10 day prednisone taper along with Percocet 5-3 25, one tab by mouth every 4-6 hours when necessary pain quantity #20 with 0 refills. She'll follow-up with orthopedics if no improvement in 7-10 days. Return to the  ER for any worsening symptoms urgent changes in health.    Duanne Guess, PA-C 03/27/15 2209  Lavonia Drafts, MD 03/29/15 [phone]

## 2015-04-05 DIAGNOSIS — M5441 Lumbago with sciatica, right side: Secondary | ICD-10-CM | POA: Diagnosis not present

## 2015-07-13 ENCOUNTER — Other Ambulatory Visit: Payer: Self-pay | Admitting: Obstetrics and Gynecology

## 2015-07-13 DIAGNOSIS — Z1231 Encounter for screening mammogram for malignant neoplasm of breast: Secondary | ICD-10-CM

## 2015-07-15 ENCOUNTER — Ambulatory Visit
Admission: RE | Admit: 2015-07-15 | Discharge: 2015-07-15 | Disposition: A | Discharge status: Home / Self Care | Payer: Medicare Other | Source: Ambulatory Visit | Attending: Obstetrics and Gynecology | Admitting: Obstetrics and Gynecology

## 2015-07-15 DIAGNOSIS — Z1231 Encounter for screening mammogram for malignant neoplasm of breast: Secondary | ICD-10-CM | POA: Diagnosis not present

## 2015-08-19 ENCOUNTER — Other Ambulatory Visit: Payer: Self-pay | Admitting: Family Medicine

## 2015-11-22 ENCOUNTER — Other Ambulatory Visit: Payer: Self-pay | Admitting: Family Medicine

## 2015-12-20 ENCOUNTER — Other Ambulatory Visit: Payer: Self-pay | Admitting: Family Medicine

## 2016-01-22 ENCOUNTER — Other Ambulatory Visit: Payer: Self-pay | Admitting: Family Medicine

## 2016-01-22 DIAGNOSIS — I159 Secondary hypertension, unspecified: Secondary | ICD-10-CM

## 2016-01-22 DIAGNOSIS — K219 Gastro-esophageal reflux disease without esophagitis: Secondary | ICD-10-CM

## 2016-01-24 NOTE — Telephone Encounter (Signed)
Patient needs appointmentr before getting any more medication

## 2016-02-18 ENCOUNTER — Other Ambulatory Visit: Payer: Self-pay | Admitting: Family Medicine

## 2016-02-18 DIAGNOSIS — I159 Secondary hypertension, unspecified: Secondary | ICD-10-CM

## 2016-02-19 ENCOUNTER — Other Ambulatory Visit: Payer: Self-pay | Admitting: Family Medicine

## 2016-02-19 DIAGNOSIS — E785 Hyperlipidemia, unspecified: Secondary | ICD-10-CM

## 2016-05-16 ENCOUNTER — Other Ambulatory Visit: Payer: Self-pay | Admitting: Family Medicine

## 2016-06-10 ENCOUNTER — Other Ambulatory Visit: Payer: Self-pay | Admitting: Family Medicine

## 2016-06-10 DIAGNOSIS — I159 Secondary hypertension, unspecified: Secondary | ICD-10-CM

## 2016-06-10 DIAGNOSIS — K219 Gastro-esophageal reflux disease without esophagitis: Secondary | ICD-10-CM

## 2016-06-13 ENCOUNTER — Other Ambulatory Visit: Payer: Self-pay | Admitting: Family Medicine

## 2016-07-25 ENCOUNTER — Other Ambulatory Visit: Payer: Self-pay | Admitting: Obstetrics and Gynecology

## 2016-07-25 DIAGNOSIS — Z1231 Encounter for screening mammogram for malignant neoplasm of breast: Secondary | ICD-10-CM

## 2016-08-01 ENCOUNTER — Encounter (INDEPENDENT_AMBULATORY_CARE_PROVIDER_SITE_OTHER): Payer: Self-pay

## 2016-08-01 ENCOUNTER — Ambulatory Visit
Admission: RE | Admit: 2016-08-01 | Discharge: 2016-08-01 | Disposition: A | Discharge status: Home / Self Care | Payer: Medicare HMO | Source: Ambulatory Visit | Attending: Obstetrics and Gynecology | Admitting: Obstetrics and Gynecology

## 2016-08-01 DIAGNOSIS — Z1231 Encounter for screening mammogram for malignant neoplasm of breast: Secondary | ICD-10-CM | POA: Insufficient documentation

## 2016-08-22 ENCOUNTER — Other Ambulatory Visit: Payer: Self-pay | Admitting: Family Medicine

## 2016-08-22 DIAGNOSIS — E785 Hyperlipidemia, unspecified: Secondary | ICD-10-CM

## 2016-08-28 ENCOUNTER — Other Ambulatory Visit: Payer: Self-pay | Admitting: Family Medicine

## 2016-08-28 DIAGNOSIS — I159 Secondary hypertension, unspecified: Secondary | ICD-10-CM

## 2016-09-14 ENCOUNTER — Emergency Department
Admission: EM | Admit: 2016-09-14 | Discharge: 2016-09-14 | Disposition: A | Discharge status: Home / Self Care | Payer: Medicare HMO | Attending: Emergency Medicine | Admitting: Emergency Medicine

## 2016-09-14 DIAGNOSIS — Z7984 Long term (current) use of oral hypoglycemic drugs: Secondary | ICD-10-CM | POA: Diagnosis not present

## 2016-09-14 DIAGNOSIS — I10 Essential (primary) hypertension: Secondary | ICD-10-CM | POA: Insufficient documentation

## 2016-09-14 DIAGNOSIS — Z79899 Other long term (current) drug therapy: Secondary | ICD-10-CM | POA: Diagnosis not present

## 2016-09-14 DIAGNOSIS — K645 Perianal venous thrombosis: Secondary | ICD-10-CM | POA: Diagnosis not present

## 2016-09-14 DIAGNOSIS — E119 Type 2 diabetes mellitus without complications: Secondary | ICD-10-CM | POA: Diagnosis not present

## 2016-09-14 DIAGNOSIS — K625 Hemorrhage of anus and rectum: Secondary | ICD-10-CM | POA: Diagnosis present

## 2016-09-14 MED ORDER — DOCUSATE SODIUM 100 MG PO CAPS: 200.0000 mg | ORAL_CAPSULE | Freq: Two times a day (BID) | ORAL | 0 refills | Status: DC

## 2016-09-14 MED ORDER — PRAMOXINE HCL 1 % RE FOAM: 1.0000 "application " | Freq: Three times a day (TID) | RECTAL | 0 refills | Status: DC | PRN

## 2016-09-14 MED ORDER — TUCKS 50 % EX PADS: 1.0000 "application " | MEDICATED_PAD | CUTANEOUS | 2 refills | Status: DC | PRN

## 2016-09-14 NOTE — ED Provider Notes (Signed)
[facility] Emergency Department Provider Note  ____________________________________________  Time seen: Approximately 5:18 PM  I have reviewed the triage vital signs and the nursing notes.   HISTORY  Chief Complaint Rectal Bleeding    HPI Taylor Shaw is a 76 y.o. female with a history of external hemorrhoid who had a bowel movement today of 2 PM that was nonbloody and normal in appearance with brown stool. However, when she wiped she noticed blood on the toilet paper and then started having some bleeding trickle from the anus. Denies abdominal pain chest pain shortness of breath fevers chills or dizziness. Does not Take blood thinners.     Past Medical History:  Diagnosis Date  . Arthritis   . GERD (gastroesophageal reflux disease)   . Hyperlipidemia   . Hypertension      Patient Active Problem List   Diagnosis Date Noted  . Diabetes ([place]) 02/19/2015  . Hypertension 01/15/2015  . Arthritis 01/15/2015  . Hyperlipidemia 01/15/2015  . Obesity 01/15/2015  . Elevated blood sugar 01/15/2015     Past Surgical History:  Procedure Laterality Date  . COLONOSCOPY    . TOTAL HIP ARTHROPLASTY Bilateral   . TUBAL LIGATION       Prior to Admission medications   Medication Sig Start Date End Date Taking? Authorizing Provider  amLODipine (NORVASC) 10 MG tablet TAKE 1 TABLET(10 MG) BY MOUTH DAILY 01/24/16   Arlis Porta., MD  atorvastatin (LIPITOR) 10 MG tablet TAKE 1 TABLET(10 MG) BY MOUTH DAILY 02/21/16   Arlis Porta., MD  carvedilol (COREG) 25 MG tablet TAKE 1 TABLET BY MOUTH TWICE DAILY WITH MEALS 06/12/16   Olin Hauser, DO  Cholecalciferol (VITAMIN D) 2000 UNITS CAPS Take 1 capsule by mouth daily.    Historical Provider, MD  docusate sodium (COLACE) 100 MG capsule Take 2 capsules (200 mg total) by mouth 2 (two) times daily. 09/14/16   Carrie Mew, MD  doxazosin (CARDURA) 8 MG tablet TAKE 1 TABLET(8 MG) BY MOUTH DAILY  01/24/16   Arlis Porta., MD  metFORMIN (GLUCOPHAGE) 500 MG tablet TAKE 1 TABLET BY MOUTH ONCE DAILY WITH BREAKFAST 12/20/15   Arlis Porta., MD  Omega-3 Fatty Acids (FISH OIL) 1000 MG CAPS Take 1 capsule by mouth daily.    Historical Provider, MD  omeprazole (PRILOSEC) 20 MG capsule TAKE 1 CAPSULE(20 MG) BY MOUTH DAILY 01/24/16   Arlis Porta., MD  pramoxine (PROCTOFOAM) 1 % foam Place 1 application rectally 3 (three) times daily as needed for itching or hemorrhoids. 09/14/16   Carrie Mew, MD  predniSONE (DELTASONE) 10 MG tablet 10 day taper. 5,5,4,4,3,3,2,2,1,1 03/27/15   Duanne Guess, PA-C  valsartan-hydrochlorothiazide (DIOVAN-HCT) 320-12.5 MG tablet TAKE 1 TABLET BY MOUTH DAILY 02/18/16   Olin Hauser, DO  Witch Hazel (TUCKS) 50 % PADS Apply 1 application topically every 2 (two) hours as needed. 09/14/16   Carrie Mew, MD     Allergies Patient has no known allergies.   Family History  Problem Relation Age of Onset  . Dementia Mother   . Hypertension Mother   . Arthritis Mother   . Cancer Father     lung  . Cancer Daughter     breast  . Breast cancer Sister 24    Social History Social History  Substance Use Topics  . Smoking status: Never Smoker  . Smokeless tobacco: Never Used  . Alcohol use No    Review of  Systems  Constitutional:   No fever or chills.  ENT:   No sore throat. No rhinorrhea. Cardiovascular:   No chest pain. Respiratory:   No dyspnea or cough. Gastrointestinal:   Negative for abdominal pain, vomiting and diarrhea.  Genitourinary:   Negative for dysuria or difficulty urinating. Musculoskeletal:   Negative for focal pain or swelling Neurological:   Negative for headaches 10-point ROS otherwise negative.  ____________________________________________   PHYSICAL EXAM:  VITAL SIGNS: ED Triage Vitals [09/14/16 1438]  Enc Vitals Group     BP (!) 145/65     Pulse Rate 76     Resp 20     Temp 98.2 F (36.8 C)      Temp Source Oral     SpO2 95 %     Weight 267 lb (121.1 kg)     Height 5\' 4"  (1.626 m)     Head Circumference      Peak Flow      Pain Score      Pain Loc      Pain Edu?      Excl. in [place]?     Vital signs reviewed, nursing assessments reviewed.   Constitutional:   Alert and oriented. Well appearing and in no distress. Eyes:   No scleral icterus. No conjunctival pallor. PERRL. EOMI.  No nystagmus. ENT   Head:   Normocephalic and atraumatic.   Nose:   No congestion/rhinnorhea. No septal hematoma   Mouth/Throat:   MMM, no pharyngeal erythema. No peritonsillar mass.    Neck:   No stridor. No SubQ emphysema. No meningismus. Hematological/Lymphatic/Immunilogical:   No cervical lymphadenopathy. Cardiovascular:   RRR. Symmetric bilateral radial and DP pulses.  No murmurs.  Respiratory:   Normal respiratory effort without tachypnea nor retractions. Breath sounds are clear and equal bilaterally. No wheezes/rales/rhonchi. Gastrointestinal:   Soft and nontender. Non distended. There is no CVA tenderness.  No rebound, rigidity, or guarding. Rectal exam performed with nurse at bedside. There is a large, 2-3 cm thrombosed external hemorrhoid. Nontender. There is an area of obvious surface defect which was likely the bleeding source and is now hemostatic. There is a scant amount of red blood on digital exam without any obvious active bleeding.  Genitourinary:   deferred Musculoskeletal:   Normal range of motion in all extremities. No joint effusions.  No lower extremity tenderness.  No edema. Neurologic:   Normal speech and language.  CN 2-10 normal. Motor grossly intact. No gross focal neurologic deficits are appreciated.  Skin:    Skin is warm, dry and intact. No rash noted.  No petechiae, purpura, or bullae.  ____________________________________________    LABS (pertinent positives/negatives) (all labs ordered are listed, but only abnormal results are displayed) Labs  Reviewed - No data to display ____________________________________________   EKG    ____________________________________________    RADIOLOGY  No results found.  ____________________________________________   PROCEDURES Procedures  ____________________________________________   INITIAL IMPRESSION / ASSESSMENT AND PLAN / ED COURSE  Pertinent labs & imaging results that were available during my care of the patient were reviewed by me and considered in my medical decision making (see chart for details).  Patient well appearing no acute distress presents with bleeding thrombosed external hemorrhoid. It's nontender and she is not complaining of any pain so symptoms are relatively well controlled. Discussed with surgery who recommends conservative treatment and follow up with primary care. Asian counseled on treatment options, need for follow-up. Return precautions given.Considering the patient's symptoms, medical history,  and physical examination today, I have low suspicion for cholecystitis or biliary pathology, pancreatitis, perforation or bowel obstruction, hernia, intra-abdominal abscess, AAA or dissection, volvulus or intussusception, mesenteric ischemia, or appendicitis.  I highly doubt she is having any kind of significant GI bleeding coming from a gastric or intestinal source. This appears to be entirely consistent with a hemorrhoidal bleed, especially given that she has had normal bowel movements recently and in her usual state of health and even today just prior to the bleeding she had a normal nonbloody bowel movement.         ____________________________________________   FINAL CLINICAL IMPRESSION(S) / ED DIAGNOSES  Final diagnoses:  Thrombosed external hemorrhoid      New Prescriptions   DOCUSATE SODIUM (COLACE) 100 MG CAPSULE    Take 2 capsules (200 mg total) by mouth 2 (two) times daily.   PRAMOXINE (PROCTOFOAM) 1 % FOAM    Place 1 application rectally 3  (three) times daily as needed for itching or hemorrhoids.   WITCH HAZEL (TUCKS) 50 % PADS    Apply 1 application topically every 2 (two) hours as needed.     Portions of this note were generated with dragon dictation software. Dictation errors may occur despite best attempts at proofreading.    Carrie Mew, MD 09/14/16 [phone]

## 2016-09-14 NOTE — ED Triage Notes (Signed)
Pt states she had a normal BM around 2pm today and when she wiped she noticed a blood clot and since has been bleeding from her rectum, states she is currently wearing a pad. Denies any pain. States she does have a hx of hemorrhoids

## 2016-09-14 NOTE — Discharge Instructions (Signed)
The surgeon recommends trying the prescribed medications and taking sitz baths for the next 3 days.  Follow up with your primary care doctor.  If the bleeding and symptoms do not improve, you may need referral to surgery clinic at a later time.

## 2016-10-13 ENCOUNTER — Other Ambulatory Visit: Payer: Self-pay

## 2016-10-17 ENCOUNTER — Telehealth: Payer: Self-pay

## 2016-10-17 ENCOUNTER — Encounter: Payer: Self-pay | Admitting: Surgery

## 2016-10-17 ENCOUNTER — Ambulatory Visit (INDEPENDENT_AMBULATORY_CARE_PROVIDER_SITE_OTHER): Payer: Medicare HMO | Admitting: Surgery

## 2016-10-17 VITALS — BP 147/70 | HR 58 | Temp 99.0°F | Ht 64.0 in | Wt 259.0 lb

## 2016-10-17 DIAGNOSIS — K219 Gastro-esophageal reflux disease without esophagitis: Secondary | ICD-10-CM | POA: Insufficient documentation

## 2016-10-17 DIAGNOSIS — K602 Anal fissure, unspecified: Secondary | ICD-10-CM

## 2016-10-17 MED ORDER — UNABLE TO FIND: 1 refills | Status: DC

## 2016-10-17 NOTE — Telephone Encounter (Signed)
Nifedipine/Lidocaine combination medication called in to [facility] for patient at this time. Please see Medication List for further details and instructions.

## 2016-10-17 NOTE — Progress Notes (Signed)
Patient ID: Taylor Shaw, female   DOB: 1941-05-06, 76 y.o.   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 She reports that this condition has been going on for several months and last month she went to the emergency room due to hematochezia and some anorectal discomfort. She states that she has blood after wiping, and some blood in the bathroom bowl. She experiences some discomfort especially after a large caliber stool. She has been currently on Proctofoam and witch hazel pads. She has also started some stool softener. He has been some relief But she still persists with some anorectal symptoms. Her last colonoscopy was done at the end of [place] more than 6 years ago and patient is unsure on the final recommendations. No history of colorectal cancer in the family. She does have some occasional intermittent lower abdominal pain that is mild and told. No precipitating or relieving factors.  HPI  Past Medical History:  Diagnosis Date  . Arthritis   . Diabetes ([place]) 02/19/2015  . Elevated blood sugar 01/15/2015  . GERD (gastroesophageal reflux disease)   . Hyperlipidemia   . Hypertension   . Hypertension 01/15/2015  . Obesity 01/15/2015    Past Surgical History:  Procedure Laterality Date  . COLONOSCOPY    . TOTAL HIP ARTHROPLASTY Bilateral   . TUBAL LIGATION      Family History  Problem Relation Age of Onset  . Dementia Mother   . Hypertension Mother   . Arthritis Mother   . Cancer Father        lung  . Cancer Daughter        breast  . Breast cancer Sister 24    Social History Social History  Substance Use Topics  . Smoking status: Never Smoker  . Smokeless tobacco: Never Used  . Alcohol use No    No Known Allergies  Current Outpatient Prescriptions  Medication Sig Dispense Refill  . amLODipine (NORVASC) 10 MG tablet TAKE 1 TABLET(10 MG) BY MOUTH DAILY 90 tablet 0  . atorvastatin (LIPITOR) 10 MG tablet TAKE 1  TABLET(10 MG) BY MOUTH DAILY 90 tablet 0  . carvedilol (COREG) 25 MG tablet TAKE 1 TABLET BY MOUTH TWICE DAILY WITH MEALS 180 tablet 0  . docusate sodium (COLACE) 100 MG capsule Take 2 capsules (200 mg total) by mouth 2 (two) times daily. 120 capsule 0  . doxazosin (CARDURA) 8 MG tablet TAKE 1 TABLET(8 MG) BY MOUTH DAILY 90 tablet 0  . meloxicam (MOBIC) 7.5 MG tablet TAKE 1 TABLET(7.5 MG) BY MOUTH EVERY DAY    . metFORMIN (GLUCOPHAGE) 500 MG tablet TAKE 1 TABLET BY MOUTH ONCE DAILY WITH BREAKFAST 30 tablet 1  . Omega-3 Fatty Acids (FISH OIL) 1000 MG CAPS Take 1 capsule by mouth daily.    Marland Kitchen omeprazole (PRILOSEC) 20 MG capsule TAKE 1 CAPSULE(20 MG) BY MOUTH DAILY 90 capsule 0  . polyethylene glycol (MIRALAX / GLYCOLAX) packet Take 1 packet by mouth daily.    . pramoxine (PROCTOFOAM) 1 % foam Place 1 application rectally 3 (three) times daily as needed for itching or hemorrhoids. 15 g 0  . predniSONE (DELTASONE) 10 MG tablet 10 day taper. 5,5,4,4,3,3,2,2,1,1 30 tablet 0  . senna-docusate (SENOKOT-S) 8.6-50 MG tablet Take 1 tablet by mouth 2 (two) times daily.    Marland Kitchen tiZANidine (ZANAFLEX) 4 MG tablet TAKE 1 TABLET BY MOUTH ONCE DAILY AS NEEDED    . valsartan-hydrochlorothiazide (DIOVAN-HCT) 320-12.5  MG tablet TAKE 1 TABLET BY MOUTH DAILY 90 tablet 0  . Witch Hazel (TUCKS) 50 % PADS Apply 1 application topically every 2 (two) hours as needed. 40 each 2   No current facility-administered medications for this visit.      Review of Systems Full ROS  was asked and was negative except for the information on the HPI  Physical Exam Blood pressure (!) 147/70, pulse (!) 58, temperature 99 F (37.2 C), temperature source Oral, height 5\' 4"  (1.626 m), weight 117.5 kg (259 lb). CONSTITUTIONAL: NAD EYES: Conjunctiva is clear, Sclera are non-icteric. EARS, NOSE, MOUTH AND THROAT: The oropharynx is clear. The oral mucosa is pink and moist. Hearing is intact to voice. LYMPH NODES:  Lymph nodes in the neck are  normal. RESPIRATORY:  . There is normal respiratory effort,  without pathologic use of accessory muscles. CARDIOVASCULAR: Good distal perfusion, good capillary refill and pulses is regular  GI: The abdomen is soft, nontender, and nondistended. There are no palpable masses. There is no hepatosplenomegaly. There are normal bowel sounds in all quadrants.  Rectal : posterior midline mucosal ulceration c/o fissure, Tender rectal exam. No rectal masses.   deferred.   MUSCULOSKELETAL: Normal muscle strength and tone. No cyanosis or edema.   SKIN: Turgor is good and there are no pathologic skin lesions or ulcers. NEUROLOGIC: Motor and sensation is grossly normal. Cranial nerves are grossly intact. PSYCH:  Oriented to person, place and time. Affect is normal.  Data Reviewed  I have personally reviewed the patient's imaging, laboratory findings and medical records.    Assessment/Plan Anal Fissure causing Hematochezia. Recommend DC topical steroid and astringent. Sitz baths BID, stools softener and fiber, Nifedipine cream BID. We will arrange colonoscopy. F/U 3-4 weeks/ No surgical intervention required.    Caroleen Hamman, MD FACS General Surgeon 10/17/2016, 2:57 PM

## 2016-10-17 NOTE — Patient Instructions (Addendum)
You have been seen today for an anal fissure. Stop using all topical creams or pads that you have been previously given. We will send a cream to [place] on [address]. Please pick up this medication and place into the rectum three times daily.  Continue your stool softeners (Colace, Senna, Miralax). Continue your Sitz baths twice daily until seen in office once again.  We will see you back in 3 weeks to see how you are doing. Please call if you have any questions or concerns prior to this.   Anal Fissure, Adult An anal fissure is a small tear or crack in the skin around the opening of the butt (anus).Bleeding from the tear or crack usually stops on its own within a few minutes. The bleeding may happen every time you poop (have a bowel movement) until the tear or crack heals. Follow these instructions at home: Eating and drinking   Avoid bananas and dairy products. These foods can make it hard to poop.  Drink enough fluid to keep your pee (urine) clear or pale yellow.  Eat a lot of fruit, whole grains, and vegetables. General instructions   Keep the butt area as clean and dry as you can.  Take a warm water bath (sitz bath) as told by your doctor. Do not use soap.  Take over-the-counter and prescription medicines only as told by your doctor.  Use creams or ointments only as told by your doctor.  Keep all follow-up visits as told by your doctor. This is important. Contact a doctor if:  You have more bleeding.  You have a fever.  You have watery poop (diarrhea) that is mixed with blood.  You have pain.  You problem gets worse, not better. This information is not intended to replace advice given to you by your health care provider. Make sure you discuss any questions you have with your health care provider. Document Released: 01/18/2011 Document Revised: 10/28/2015 Document Reviewed: 08/17/2014 Elsevier Interactive Patient Education  2017 [employer].    How to  Take a [employer] A sitz bath is a warm water bath that is taken while you are sitting down. The water should only come up to your hips and should cover your buttocks. Your health care provider may recommend a sitz bath to help you:  Clean the lower part of your body, including your genital area.  With itching.  With pain.  With sore muscles or muscles that tighten or spasm. How to take a sitz bath Take 3-4 sitz baths per day or as told by your health care provider. 1. Partially fill a bathtub with warm water. You will only need the water to be deep enough to cover your hips and buttocks when you are sitting in it. 2. If your health care provider told you to put medicine in the water, follow the directions exactly. 3. Sit in the water and open the tub drain a little. 4. Turn on the warm water again to keep the tub at the correct level. Keep the water running constantly. 5. Soak in the water for 15-20 minutes or as told by your health care provider. 6. After the sitz bath, pat the affected area dry first. Do not rub it. 7. Be careful when you stand up after the sitz bath because you may feel dizzy. Contact a health care provider if:  Your symptoms get worse. Do not continue with sitz baths if your symptoms get worse.  You have new symptoms. Do  not continue with sitz baths until you talk with your health care provider. This information is not intended to replace advice given to you by your health care provider. Make sure you discuss any questions you have with your health care provider. Document Released: 02/12/2004 Document Revised: 10/20/2015 Document Reviewed: 05/20/2014 Elsevier Interactive Patient Education  2017 [employer].

## 2016-11-10 ENCOUNTER — Ambulatory Visit (INDEPENDENT_AMBULATORY_CARE_PROVIDER_SITE_OTHER): Payer: Medicare HMO | Admitting: Surgery

## 2016-11-10 ENCOUNTER — Other Ambulatory Visit: Payer: Self-pay

## 2016-11-10 ENCOUNTER — Telehealth: Payer: Self-pay | Admitting: Gastroenterology

## 2016-11-10 ENCOUNTER — Telehealth: Payer: Self-pay

## 2016-11-10 ENCOUNTER — Encounter: Payer: Self-pay | Admitting: Surgery

## 2016-11-10 VITALS — BP 113/70 | HR 71 | Temp 98.5°F | Ht 64.0 in | Wt 255.2 lb

## 2016-11-10 DIAGNOSIS — K649 Unspecified hemorrhoids: Secondary | ICD-10-CM

## 2016-11-10 NOTE — Telephone Encounter (Signed)
11/10/16 Prior auth is NOT required for [facility] for Colonoscopy [phone].

## 2016-11-10 NOTE — Patient Instructions (Addendum)
We have scheduled your colonoscopy with [facility] on 12/07/16 at [facility] at Dr. Durwin Reges  We will follow up with you at the listed appointment below.  Continue using the cream as prescribed and continue your sitz baths as well.      How to Take a Sitz Bath A sitz bath is a warm water bath that is taken while you are sitting down. The water should only come up to your hips and should cover your buttocks. Your health care provider may recommend a sitz bath to help you:  Clean the lower part of your body, including your genital area.  With itching.  With pain.  With sore muscles or muscles that tighten or spasm.  How to take a sitz bath Take 3-4 sitz baths per day or as told by your health care provider. 1. Partially fill a bathtub with warm water. You will only need the water to be deep enough to cover your hips and buttocks when you are sitting in it. 2. If your health care provider told you to put medicine in the water, follow the directions exactly. 3. Sit in the water and open the tub drain a little. 4. Turn on the warm water again to keep the tub at the correct level. Keep the water running constantly. 5. Soak in the water for 15-20 minutes or as told by your health care provider. 6. After the sitz bath, pat the affected area dry first. Do not rub it. 7. Be careful when you stand up after the sitz bath because you may feel dizzy.  Contact a health care provider if:  Your symptoms get worse. Do not continue with sitz baths if your symptoms get worse.  You have new symptoms. Do not continue with sitz baths until you talk with your health care provider. This information is not intended to replace advice given to you by your health care provider. Make sure you discuss any questions you have with your health care provider. Document Released: 02/12/2004 Document Revised: 10/20/2015 Document Reviewed: 05/20/2014 Elsevier Interactive Patient Education  [employer].

## 2016-11-10 NOTE — Progress Notes (Signed)
Outpatient Surgical Follow Up  11/10/2016  LEEANNE Shaw is an 76 y.o. female.   Chief Complaint  Patient presents with  . Follow-up    Anal Fissure    HPI: Versus following up for an anal fissure. She has done well up until yesterday where she had a bowel movement and passed some flatus and started bleeding significantly. Now she is sore and in the anorectal area. She still has not had her colonoscopy yet. She reports that yesterday she did not have any excruciating pain but just soreness. No fevers no chills. She has used nifedipine cream with great results  Past Medical History:  Diagnosis Date  . Arthritis   . Diabetes ([place]) 02/19/2015  . Elevated blood sugar 01/15/2015  . GERD (gastroesophageal reflux disease)   . Hyperlipidemia   . Hypertension   . Hypertension 01/15/2015  . Obesity 01/15/2015    Past Surgical History:  Procedure Laterality Date  . COLONOSCOPY    . TOTAL HIP ARTHROPLASTY Bilateral   . TUBAL LIGATION      Family History  Problem Relation Age of Onset  . Dementia Mother   . Hypertension Mother   . Arthritis Mother   . Cancer Father        lung  . Cancer Daughter        breast  . Breast cancer Sister 50    Social History:  reports that she has never smoked. She has never used smokeless tobacco. She reports that she does not drink alcohol or use drugs.  Allergies: No Known Allergies  Medications reviewed.    ROS Full ROS performed and is otherwise negative other than what is stated in HPI   BP 113/70   Pulse 71   Temp 98.5 F (36.9 C) (Oral)   Ht 5\' 4"  (1.626 m)   Wt 115.8 kg (255 lb 3.2 oz)   BMI 43.80 kg/m   Physical Exam  Constitutional: She is oriented to person, place, and time and well-developed, well-nourished, and in no distress. No distress.  Neck: No JVD present. No tracheal deviation present.  Pulmonary/Chest: Effort normal. No respiratory distress.  Abdominal: Soft. She exhibits no distension. There is no tenderness.  There is no rebound and no guarding.  Genitourinary:  Genitourinary Comments: Rectal There is a grade III hemorrhoid on left lateral aspect, W fresh clot. Fissure healing well. Exam is tender. No palpable masses  Musculoskeletal: Normal range of motion. She exhibits no edema.  Neurological: She is alert and oriented to person, place, and time. Gait normal. GCS score is 15.  Skin: Skin is warm and dry. She is not diaphoretic.  Psychiatric: Mood, memory, affect and judgment normal.  Nursing note and vitals reviewed.      No results found for this or any previous visit (from the past 48 hour(s)). No results found.  Assessment/Plan: Anal fissure: Improving with medical management. Continue Nifedipine cream for now Hemorrhoid causing rectal bleeding. Discussed with the patient in detail about medical management with sitz baths, fiber. She will definitely need a colonoscopy to rule out any evidence of cancer. John to clinic in 3 weeks. If she continues to have issues with bleeding and we will schedule her for hemorrhoidectomy.   Caroleen Hamman, MD [facility] General Surgeon

## 2016-11-10 NOTE — Telephone Encounter (Signed)
Dr. Corlis Leak office contacted [place] to schedule pt for a colonoscopy due to hemorrhoids.  Pt has been scheduled colonoscopy 12/07/16 with Dr. Allen Norris at [facility]. GI Questions answered with pts daughter and herself.  Gastroenterology Pre-Procedure Review  Request Date: 12/07/16 Requesting Physician: Dr. Allen Norris  PATIENT REVIEW QUESTIONS: The patient responded to the following health history questions as indicated:    1. Are you having any GI issues? yes (Hemmorhoids) 2. Do you have a personal history of Polyps? yes (last colonoscopy ??2013 pt thinks she had polyps) 3. Do you have a family history of Colon Cancer or Polyps? no 4. Diabetes Mellitus? yes (type 2) 5. Joint replacements in the past 12 months?no 6. Major health problems in the past 3 months?no 7. Any artificial heart valves, MVP, or defibrillator?no    MEDICATIONS & ALLERGIES:    Patient reports the following regarding taking any anticoagulation/antiplatelet therapy:   Plavix, Coumadin, Eliquis, Xarelto, Lovenox, Pradaxa, Brilinta, or Effient? no Aspirin? no  Patient confirms/reports the following medications:  Current Outpatient Prescriptions  Medication Sig Dispense Refill  . amLODipine (NORVASC) 10 MG tablet TAKE 1 TABLET(10 MG) BY MOUTH DAILY 90 tablet 0  . atorvastatin (LIPITOR) 10 MG tablet TAKE 1 TABLET(10 MG) BY MOUTH DAILY 90 tablet 0  . doxazosin (CARDURA) 8 MG tablet TAKE 1 TABLET(8 MG) BY MOUTH DAILY 90 tablet 0  . meloxicam (MOBIC) 7.5 MG tablet TAKE 1 TABLET(7.5 MG) BY MOUTH EVERY DAY    . metFORMIN (GLUCOPHAGE) 500 MG tablet TAKE 1 TABLET BY MOUTH ONCE DAILY WITH BREAKFAST 30 tablet 1  . omeprazole (PRILOSEC) 20 MG capsule TAKE 1 CAPSULE(20 MG) BY MOUTH DAILY 90 capsule 0  . tiZANidine (ZANAFLEX) 4 MG tablet TAKE 1 TABLET BY MOUTH ONCE DAILY AS NEEDED    . UNABLE TO FIND Med Name: 0.3% Nifedipine + 1.5% Lidocaine in [facility]- Place on Anal area TID. 30 g 1  . valsartan-hydrochlorothiazide (DIOVAN-HCT) 320-12.5 MG  tablet TAKE 1 TABLET BY MOUTH DAILY 90 tablet 0   No current facility-administered medications for this visit.     Patient confirms/reports the following allergies:  No Known Allergies  No orders of the defined types were placed in this encounter.   AUTHORIZATION INFORMATION Primary Insurance: 1D#: Group #:  Secondary Insurance: 1D#: Group #:  SCHEDULE INFORMATION: Date: 12/07/16 Time: Location:[facility]

## 2016-11-28 ENCOUNTER — Encounter: Payer: Self-pay | Admitting: *Deleted

## 2016-12-01 NOTE — Discharge Instructions (Signed)
General Anesthesia, Adult, Care After °These instructions provide you with information about caring for yourself after your procedure. Your health care provider may also give you more specific instructions. Your treatment has been planned according to current medical practices, but problems sometimes occur. Call your health care provider if you have any problems or questions after your procedure. °What can I expect after the procedure? °After the procedure, it is common to have: °· Vomiting. °· A sore throat. °· Mental slowness. ° °It is common to feel: °· Nauseous. °· Cold or shivery. °· Sleepy. °· Tired. °· Sore or achy, even in parts of your body where you did not have surgery. ° °Follow these instructions at home: °For at least 24 hours after the procedure: °· Do not: °? Participate in activities where you could fall or become injured. °? Drive. °? Use heavy machinery. °? Drink alcohol. °? Take sleeping pills or medicines that cause drowsiness. °? Make important decisions or sign legal documents. °? Take care of children on your own. °· Rest. °Eating and drinking °· If you vomit, drink water, juice, or soup when you can drink without vomiting. °· Drink enough fluid to keep your urine clear or pale yellow. °· Make sure you have little or no nausea before eating solid foods. °· Follow the diet recommended by your health care provider. °General instructions °· Have a responsible adult stay with you until you are awake and alert. °· Return to your normal activities as told by your health care provider. Ask your health care provider what activities are safe for you. °· Take over-the-counter and prescription medicines only as told by your health care provider. °· If you smoke, do not smoke without supervision. °· Keep all follow-up visits as told by your health care provider. This is important. °Contact a health care provider if: °· You continue to have nausea or vomiting at home, and medicines are not helpful. °· You  cannot drink fluids or start eating again. °· You cannot urinate after 8-12 hours. °· You develop a skin rash. °· You have fever. °· You have increasing redness at the site of your procedure. °Get help right away if: °· You have difficulty breathing. °· You have chest pain. °· You have unexpected bleeding. °· You feel that you are having a life-threatening or urgent problem. °This information is not intended to replace advice given to you by your health care provider. Make sure you discuss any questions you have with your health care provider. °Document Released: 08/28/2000 Document Revised: 10/25/2015 Document Reviewed: 05/06/2015 °Elsevier Interactive Patient Education © 2018 Elsevier Inc. ° °

## 2016-12-07 ENCOUNTER — Ambulatory Visit: Payer: Medicare HMO | Admitting: Anesthesiology

## 2016-12-07 ENCOUNTER — Encounter
Admission: RE | Disposition: A | Discharge status: Home / Self Care | Payer: Self-pay | Source: Ambulatory Visit | Attending: Gastroenterology

## 2016-12-07 ENCOUNTER — Ambulatory Visit
Admission: RE | Admit: 2016-12-07 | Discharge: 2016-12-07 | Disposition: A | Discharge status: Home / Self Care | Payer: Medicare HMO | Source: Ambulatory Visit | Attending: Gastroenterology | Admitting: Gastroenterology

## 2016-12-07 DIAGNOSIS — E669 Obesity, unspecified: Secondary | ICD-10-CM | POA: Insufficient documentation

## 2016-12-07 DIAGNOSIS — D122 Benign neoplasm of ascending colon: Secondary | ICD-10-CM | POA: Diagnosis not present

## 2016-12-07 DIAGNOSIS — Z6841 Body Mass Index (BMI) 40.0 and over, adult: Secondary | ICD-10-CM | POA: Diagnosis not present

## 2016-12-07 DIAGNOSIS — Z860100 Personal history of colon polyps, unspecified: Secondary | ICD-10-CM

## 2016-12-07 DIAGNOSIS — Z1211 Encounter for screening for malignant neoplasm of colon: Secondary | ICD-10-CM | POA: Diagnosis not present

## 2016-12-07 DIAGNOSIS — M1990 Unspecified osteoarthritis, unspecified site: Secondary | ICD-10-CM | POA: Diagnosis not present

## 2016-12-07 DIAGNOSIS — K5730 Diverticulosis of large intestine without perforation or abscess without bleeding: Secondary | ICD-10-CM | POA: Diagnosis not present

## 2016-12-07 DIAGNOSIS — E119 Type 2 diabetes mellitus without complications: Secondary | ICD-10-CM | POA: Insufficient documentation

## 2016-12-07 DIAGNOSIS — Z79899 Other long term (current) drug therapy: Secondary | ICD-10-CM | POA: Diagnosis not present

## 2016-12-07 DIAGNOSIS — K5790 Diverticulosis of intestine, part unspecified, without perforation or abscess without bleeding: Secondary | ICD-10-CM

## 2016-12-07 DIAGNOSIS — I10 Essential (primary) hypertension: Secondary | ICD-10-CM | POA: Diagnosis not present

## 2016-12-07 DIAGNOSIS — E785 Hyperlipidemia, unspecified: Secondary | ICD-10-CM | POA: Diagnosis not present

## 2016-12-07 DIAGNOSIS — K641 Second degree hemorrhoids: Secondary | ICD-10-CM | POA: Diagnosis not present

## 2016-12-07 DIAGNOSIS — Z96643 Presence of artificial hip joint, bilateral: Secondary | ICD-10-CM | POA: Insufficient documentation

## 2016-12-07 DIAGNOSIS — Z7984 Long term (current) use of oral hypoglycemic drugs: Secondary | ICD-10-CM | POA: Insufficient documentation

## 2016-12-07 DIAGNOSIS — K219 Gastro-esophageal reflux disease without esophagitis: Secondary | ICD-10-CM | POA: Diagnosis not present

## 2016-12-07 HISTORY — PX: COLONOSCOPY WITH PROPOFOL: SHX5780

## 2016-12-07 HISTORY — DX: Unspecified hemorrhoids: K64.9

## 2016-12-07 HISTORY — DX: Diverticulosis of intestine, part unspecified, without perforation or abscess without bleeding: K57.90

## 2016-12-07 HISTORY — PX: POLYPECTOMY: SHX5525

## 2016-12-07 LAB — GLUCOSE, CAPILLARY
Glucose-Capillary: 112 mg/dL — ABNORMAL HIGH (ref 65–99)
Glucose-Capillary: 111 mg/dL — ABNORMAL HIGH (ref 65–99)

## 2016-12-07 SURGERY — COLONOSCOPY WITH PROPOFOL
Anesthesia: General

## 2016-12-07 MED ORDER — LACTATED RINGERS IV SOLN
INTRAVENOUS | Status: DC
  Administered 2016-12-07: 08:00:00 via INTRAVENOUS

## 2016-12-07 MED ORDER — PROPOFOL 10 MG/ML IV BOLUS
INTRAVENOUS | Status: DC | PRN
  Administered 2016-12-07: 100 mg via INTRAVENOUS
  Administered 2016-12-07 (×2): 30 mg via INTRAVENOUS
  Administered 2016-12-07: 20 mg via INTRAVENOUS

## 2016-12-07 MED ORDER — STERILE WATER FOR IRRIGATION IR SOLN
Status: DC | PRN
  Administered 2016-12-07: 08:00:00

## 2016-12-07 MED ORDER — ACETAMINOPHEN 160 MG/5ML PO SOLN: 325.0000 mg | ORAL | Status: DC | PRN

## 2016-12-07 MED ORDER — ACETAMINOPHEN 325 MG PO TABS: 325.0000 mg | ORAL_TABLET | ORAL | Status: DC | PRN

## 2016-12-07 MED ORDER — LIDOCAINE HCL (CARDIAC) 20 MG/ML IV SOLN
INTRAVENOUS | Status: DC | PRN
  Administered 2016-12-07: 40 mg via INTRAVENOUS

## 2016-12-07 SURGICAL SUPPLY — 23 items
CANISTER SUCT 1200ML W/VALVE (MISCELLANEOUS) ×4 IMPLANT
CLIP HMST 235XBRD CATH ROT (MISCELLANEOUS) IMPLANT
CLIP RESOLUTION 360 11X235 (MISCELLANEOUS)
FCP ESCP3.2XJMB 240X2.8X (MISCELLANEOUS) ×2
FORCEPS BIOP RAD 4 LRG CAP 4 (CUTTING FORCEPS) IMPLANT
FORCEPS BIOP RJ4 240 W/NDL (MISCELLANEOUS) ×2
FORCEPS ESCP3.2XJMB 240X2.8X (MISCELLANEOUS) ×2 IMPLANT
GOWN CVR UNV OPN BCK APRN NK (MISCELLANEOUS) ×4 IMPLANT
GOWN ISOL THUMB LOOP REG UNIV (MISCELLANEOUS) ×4
INJECTOR VARIJECT VIN23 (MISCELLANEOUS) IMPLANT
KIT DEFENDO VALVE AND CONN (KITS) IMPLANT
KIT ENDO PROCEDURE OLY (KITS) ×4 IMPLANT
MARKER SPOT ENDO TATTOO 5ML (MISCELLANEOUS) IMPLANT
PAD GROUND ADULT SPLIT (MISCELLANEOUS) IMPLANT
PROBE APC STR FIRE (PROBE) IMPLANT
RETRIEVER NET ROTH 2.5X230 LF (MISCELLANEOUS) IMPLANT
SNARE SHORT THROW 13M SML OVAL (MISCELLANEOUS) IMPLANT
SNARE SHORT THROW 30M LRG OVAL (MISCELLANEOUS) IMPLANT
SNARE SNG USE RND 15MM (INSTRUMENTS) IMPLANT
SPOT EX ENDOSCOPIC TATTOO (MISCELLANEOUS)
TRAP ETRAP POLY (MISCELLANEOUS) IMPLANT
VARIJECT INJECTOR VIN23 (MISCELLANEOUS)
WATER STERILE IRR 250ML POUR (IV SOLUTION) ×4 IMPLANT

## 2016-12-07 NOTE — Op Note (Signed)
[facility] Gastroenterology Patient Name: Taylor Shaw Procedure Date: 12/07/2016 7:53 AM MRN: [id number] Account #: [id number] Date of Birth: [birth year] Admit Type: Outpatient Age: 76 Room: [facility] OR ROOM 01 Gender: Female Note Status: Finalized Procedure:            Colonoscopy Indications:          High risk colon cancer surveillance: Personal history                        of colonic polyps Providers:            [name] MD, MD Medicines:            Propofol per Anesthesia Complications:        No immediate complications. Procedure:            Pre-Anesthesia Assessment:                       - Prior to the procedure, a History and Physical was                        performed, and patient medications and allergies were                        reviewed. The patient's tolerance of previous                        anesthesia was also reviewed. The risks and benefits of                        the procedure and the sedation options and risks were                        discussed with the patient. All questions were                        answered, and informed consent was obtained. Prior                        Anticoagulants: The patient has taken no previous                        anticoagulant or antiplatelet agents. ASA Grade                        Assessment: II - A patient with mild systemic disease.                        After reviewing the risks and benefits, the patient was                        deemed in satisfactory condition to undergo the                        procedure.                       After obtaining informed consent, the colonoscope was                        passed under direct vision. Throughout the  procedure,                        the patient's blood pressure, pulse, and oxygen                        saturations were monitored continuously. The Olympus                        CF-HQ190L Colonoscope (S#. [id number]) was introduced               through the anus and advanced to the the cecum,                        identified by appendiceal orifice and ileocecal valve.                        The colonoscopy was performed without difficulty. The                        patient tolerated the procedure well. The quality of                        the bowel preparation was excellent. Findings:      The perianal and digital rectal examinations were normal.      A 3 mm polyp was found in the ascending colon. The polyp was sessile.       The polyp was removed with a cold biopsy forceps. Resection and       retrieval were complete.      Non-bleeding internal hemorrhoids were found during retroflexion. The       hemorrhoids were Grade II (internal hemorrhoids that prolapse but reduce       spontaneously).      Multiple small-mouthed diverticula were found in the sigmoid colon. Impression:           - One 3 mm polyp in the ascending colon, removed with a                        cold biopsy forceps. Resected and retrieved.                       - Non-bleeding internal hemorrhoids.                       - Diverticulosis in the sigmoid colon. Recommendation:       - Discharge patient to home.                       - Resume previous diet.                       - Continue present medications.                       - Await pathology results. Procedure Code(s):    --- Professional ---                       [phone], Colonoscopy, flexible; with biopsy, single or                        multiple Diagnosis Code(s):    ---  Professional ---                       Z86.010, Personal history of colonic polyps                       D12.2, Benign neoplasm of ascending colon CPT copyright 2016 American Medical Association. All rights reserved. The codes documented in this report are preliminary and upon coder review may  be revised to meet current compliance requirements. [name] MD, MD 12/07/2016 8:09:23 AM This report has been signed  electronically. Number of Addenda: 0 Note Initiated On: 12/07/2016 7:53 AM Scope Withdrawal Time: 0 hours 7 minutes 58 seconds  Total Procedure Duration: 0 hours 10 minutes 12 seconds       [facility]

## 2016-12-07 NOTE — Transfer of Care (Signed)
Immediate Anesthesia Transfer of Care Note  Patient: Taylor Shaw  Procedure(s) Performed: Procedure(s) with comments: COLONOSCOPY WITH PROPOFOL (N/A) - Diabetic - oral meds POLYPECTOMY  Patient Location: PACU  Anesthesia Type: General  Level of Consciousness: awake, alert  and patient cooperative  Airway and Oxygen Therapy: Patient Spontanous Breathing and Patient connected to supplemental oxygen  Post-op Assessment: Post-op Vital signs reviewed, Patient's Cardiovascular Status Stable, Respiratory Function Stable, Patent Airway and No signs of Nausea or vomiting  Post-op Vital Signs: Reviewed and stable  Complications: No apparent anesthesia complications

## 2016-12-07 NOTE — Anesthesia Preprocedure Evaluation (Signed)
Anesthesia Evaluation  Patient identified by MRN, date of birth, ID band Patient awake    Reviewed: Allergy & Precautions, H&P , NPO status , Patient's Chart, lab work & pertinent test results, reviewed documented beta blocker date and time   Airway Mallampati: III  TM Distance: >3 FB Neck ROM: full    Dental no notable dental hx.    Pulmonary neg pulmonary ROS,    Pulmonary exam normal breath sounds clear to auscultation       Cardiovascular Exercise Tolerance: Good hypertension, Normal cardiovascular exam Rhythm:regular Rate:Normal     Neuro/Psych negative neurological ROS  negative psych ROS   GI/Hepatic Neg liver ROS, GERD  Medicated and Controlled,  Endo/Other  diabetes, Type 2  Renal/GU negative Renal ROS  negative genitourinary   Musculoskeletal   Abdominal   Peds  Hematology negative hematology ROS (+)   Anesthesia Other Findings   Reproductive/Obstetrics negative OB ROS                             Anesthesia Physical Anesthesia Plan  ASA: II  Anesthesia Plan: General   Post-op Pain Management:    Induction:   PONV Risk Score and Plan:   Airway Management Planned:   Additional Equipment:   Intra-op Plan:   Post-operative Plan:   Informed Consent: I have reviewed the patients History and Physical, chart, labs and discussed the procedure including the risks, benefits and alternatives for the proposed anesthesia with the patient or authorized representative who has indicated his/her understanding and acceptance.   Dental Advisory Given  Plan Discussed with: CRNA  Anesthesia Plan Comments:         Anesthesia Quick Evaluation

## 2016-12-07 NOTE — Anesthesia Procedure Notes (Signed)
Procedure Name: MAC Date/Time: 12/07/2016 7:46 AM Performed by: Janna Arch Pre-anesthesia Checklist: Patient identified, Emergency Drugs available, Suction available and Patient being monitored Patient Re-evaluated:Patient Re-evaluated prior to inductionOxygen Delivery Method: Nasal cannula Preoxygenation: Pre-oxygenation with 100% oxygen

## 2016-12-07 NOTE — H&P (Signed)
Taylor Lame, MD [address] [place], [place] [address] [phone] Fax : [phone]  Primary Care Physician:  [facility] Primary Gastroenterologist:  Dr. Allen Norris  Pre-Procedure History & Physical: HPI:  Taylor Shaw is a 76 y.o. female is here for an colonoscopy.   Past Medical History:  Diagnosis Date  . Arthritis   . Diabetes ([place]) 02/19/2015  . Elevated blood sugar 01/15/2015  . GERD (gastroesophageal reflux disease)   . Hemorrhoids   . Hyperlipidemia   . Hypertension   . Hypertension 01/15/2015  . Obesity 01/15/2015    Past Surgical History:  Procedure Laterality Date  . COLONOSCOPY    . TOTAL HIP ARTHROPLASTY Bilateral   . TUBAL LIGATION      Prior to Admission medications   Medication Sig Start Date End Date Taking? Authorizing Provider  amLODipine (NORVASC) 10 MG tablet TAKE 1 TABLET(10 MG) BY MOUTH DAILY 01/24/16  Yes Arlis Porta., MD  atorvastatin (LIPITOR) 10 MG tablet TAKE 1 TABLET(10 MG) BY MOUTH DAILY 02/21/16  Yes Arlis Porta., MD  carvedilol (COREG) 25 MG tablet Take 25 mg by mouth 2 (two) times daily. 10/30/16  Yes [provider]  Cholecalciferol (VITAMIN D3) 2000 units TABS Take by mouth.   Yes [provider]  meloxicam (MOBIC) 7.5 MG tablet TAKE 1 TABLET(7.5 MG) BY MOUTH EVERY DAY 07/14/16  Yes [provider]  metFORMIN (GLUCOPHAGE) 500 MG tablet TAKE 1 TABLET BY MOUTH ONCE DAILY WITH BREAKFAST 12/20/15  Yes Arlis Porta., MD  omeprazole (PRILOSEC) 20 MG capsule TAKE 1 CAPSULE(20 MG) BY MOUTH DAILY 01/24/16  Yes Arlis Porta., MD  UNABLE TO FIND Med Name: 0.3% Nifedipine + 1.5% Lidocaine in [facility]- Place on Anal area TID. 10/17/16  Yes Pabon, Diego F, MD  valsartan-hydrochlorothiazide (DIOVAN-HCT) 320-12.5 MG tablet TAKE 1 TABLET BY MOUTH DAILY 02/18/16  Yes Karamalegos, Devonne Doughty, DO  doxazosin (CARDURA) 8 MG tablet TAKE 1 TABLET(8 MG) BY MOUTH  DAILY Patient not taking: Reported on 11/28/2016 01/24/16   Arlis Porta., MD  tiZANidine (ZANAFLEX) 4 MG tablet TAKE 1 TABLET BY MOUTH ONCE DAILY AS NEEDED 08/28/16   [provider]    Allergies as of 11/10/2016  . (No Known Allergies)    Family History  Problem Relation Age of Onset  . Dementia Mother   . Hypertension Mother   . Arthritis Mother   . Cancer Father        lung  . Cancer Daughter        breast  . Breast cancer Sister 42    Social History   Social History  . Marital status: Divorced    Spouse name: N/A  . Number of children: N/A  . Years of education: N/A   Occupational History  . Not on file.   Social History Main Topics  . Smoking status: Never Smoker  . Smokeless tobacco: Never Used  . Alcohol use No  . Drug use: No  . Sexual activity: Not on file   Other Topics Concern  . Not on file   Social History Narrative  . No narrative on file    Review of Systems: See HPI, otherwise negative ROS  Physical Exam: BP (!) 126/57   Pulse 69   Temp 97.7 F (36.5 C) (Tympanic)   Resp 16   Ht 5\' 4"  (1.626 m)   Wt 249 lb (112.9 kg)   SpO2 98%  BMI 42.74 kg/m  General:   Alert,  pleasant and cooperative in NAD Head:  Normocephalic and atraumatic. Neck:  Supple; no masses or thyromegaly. Lungs:  Clear throughout to auscultation.    Heart:  Regular rate and rhythm. Abdomen:  Soft, nontender and nondistended. Normal bowel sounds, without guarding, and without rebound.   Neurologic:  Alert and  oriented x4;  grossly normal neurologically.  Impression/Plan: Alvino Chapel is here for an colonoscopy to be performed for history of colon polyps  Risks, benefits, limitations, and alternatives regarding  colonoscopy have been reviewed with the patient.  Questions have been answered.  All parties agreeable.   Taylor Lame, MD  12/07/2016, 7:31 AM

## 2016-12-07 NOTE — Anesthesia Postprocedure Evaluation (Signed)
Anesthesia Post Note  Patient: Taylor Shaw  Procedure(s) Performed: Procedure(s) (LRB): COLONOSCOPY WITH PROPOFOL (N/A) POLYPECTOMY  Patient location during evaluation: PACU Anesthesia Type: General Level of consciousness: awake and alert Pain management: pain level controlled Vital Signs Assessment: post-procedure vital signs reviewed and stable Respiratory status: spontaneous breathing, nonlabored ventilation and respiratory function stable Cardiovascular status: blood pressure returned to baseline and stable Postop Assessment: no signs of nausea or vomiting Anesthetic complications: no    Trecia Rogers

## 2016-12-08 ENCOUNTER — Encounter: Payer: Self-pay | Admitting: Gastroenterology

## 2016-12-08 DIAGNOSIS — K5730 Diverticulosis of large intestine without perforation or abscess without bleeding: Secondary | ICD-10-CM | POA: Insufficient documentation

## 2016-12-08 HISTORY — DX: Diverticulosis of large intestine without perforation or abscess without bleeding: K57.30

## 2016-12-13 ENCOUNTER — Ambulatory Visit (INDEPENDENT_AMBULATORY_CARE_PROVIDER_SITE_OTHER): Payer: Medicare HMO | Admitting: Surgery

## 2016-12-13 ENCOUNTER — Encounter: Payer: Self-pay | Admitting: Surgery

## 2016-12-13 VITALS — BP 178/78 | HR 62 | Temp 97.8°F | Ht 64.0 in | Wt 256.0 lb

## 2016-12-13 DIAGNOSIS — K602 Anal fissure, unspecified: Secondary | ICD-10-CM

## 2016-12-13 NOTE — Progress Notes (Signed)
Taylor Shaw f/u for anal fissure Recent Colon showed small TA ascending colon No more sxs No PE perform pt is " good" and rather not have rectal No more hematochezia  A/p doing well D/w pt in detail about situation. High fiber, stool softner, nifedipine cream. No need for surgical intervention I spent 10 minutes w greater 50% spent in counseling and coordination of care.

## 2016-12-13 NOTE — Patient Instructions (Signed)
Please call our office if you have any questions or concerns.  

## 2017-04-26 ENCOUNTER — Other Ambulatory Visit: Payer: Self-pay | Admitting: Family Medicine

## 2017-04-30 ENCOUNTER — Other Ambulatory Visit: Payer: Self-pay | Admitting: Family Medicine

## 2017-05-17 ENCOUNTER — Other Ambulatory Visit: Payer: Self-pay | Admitting: Family Medicine

## 2017-06-29 ENCOUNTER — Other Ambulatory Visit: Payer: Self-pay | Admitting: Obstetrics and Gynecology

## 2017-06-29 DIAGNOSIS — Z1231 Encounter for screening mammogram for malignant neoplasm of breast: Secondary | ICD-10-CM

## 2017-07-05 ENCOUNTER — Other Ambulatory Visit: Payer: Self-pay | Admitting: Family Medicine

## 2017-07-19 ENCOUNTER — Other Ambulatory Visit: Payer: Self-pay | Admitting: Family Medicine

## 2017-08-02 ENCOUNTER — Ambulatory Visit
Admission: RE | Admit: 2017-08-02 | Discharge: 2017-08-02 | Disposition: A | Discharge status: Home / Self Care | Payer: Medicare HMO | Source: Ambulatory Visit | Attending: Obstetrics and Gynecology | Admitting: Obstetrics and Gynecology

## 2017-08-02 ENCOUNTER — Encounter (INDEPENDENT_AMBULATORY_CARE_PROVIDER_SITE_OTHER): Payer: Self-pay

## 2017-08-02 DIAGNOSIS — Z1231 Encounter for screening mammogram for malignant neoplasm of breast: Secondary | ICD-10-CM | POA: Diagnosis not present

## 2017-08-28 ENCOUNTER — Other Ambulatory Visit: Payer: Self-pay | Admitting: Family Medicine

## 2018-03-18 ENCOUNTER — Telehealth: Payer: Self-pay | Admitting: Surgery

## 2018-03-18 NOTE — Telephone Encounter (Signed)
Patient is calling said when she has a bowel movement she has some blood, sometimes it can be a lot and sometimes no so much. Patient can be reached at [phone] please call patient and advise.

## 2018-03-18 NOTE — Telephone Encounter (Signed)
Patient added to schedule 03/20/18.

## 2018-03-20 ENCOUNTER — Encounter: Payer: Self-pay | Admitting: Surgery

## 2018-03-20 ENCOUNTER — Ambulatory Visit: Payer: Medicare HMO | Admitting: Surgery

## 2018-03-20 ENCOUNTER — Telehealth: Payer: Self-pay

## 2018-03-20 ENCOUNTER — Telehealth: Payer: Self-pay | Admitting: *Deleted

## 2018-03-20 VITALS — BP 144/82 | HR 86 | Temp 97.7°F | Ht 64.0 in | Wt 263.0 lb

## 2018-03-20 DIAGNOSIS — K602 Anal fissure, unspecified: Secondary | ICD-10-CM | POA: Diagnosis not present

## 2018-03-20 NOTE — Telephone Encounter (Signed)
Patient's surgery has been scheduled for 03-27-18 at [facility] with Dr. Dahlia Byes.  The patient's daughter, Lorayne Marek, has been notified of Pre-admission appointment date and time per patient's request.   The patient is aware to call the office should she have further questions.

## 2018-03-20 NOTE — Patient Instructions (Addendum)
How to Take a Sitz Bath A sitz bath is a warm water bath that is taken while you are sitting down. The water should only come up to your hips and should cover your buttocks. Your health care provider may recommend a sitz bath to help you:  Clean the lower part of your body, including your genital area.  With itching.  With pain.  With sore muscles or muscles that tighten or spasm.  How to take a sitz bath Take 3-4 sitz baths per day or as told by your health care provider. 1. Partially fill a bathtub with warm water. You will only need the water to be deep enough to cover your hips and buttocks when you are sitting in it. 2. If your health care provider told you to put medicine in the water, follow the directions exactly. 3. Sit in the water and open the tub drain a little. 4. Turn on the warm water again to keep the tub at the correct level. Keep the water running constantly. 5. Soak in the water for 15-20 minutes or as told by your health care provider. 6. After the sitz bath, pat the affected area dry first. Do not rub it. 7. Be careful when you stand up after the sitz bath because you may feel dizzy.  Contact a health care provider if:  Your symptoms get worse. Do not continue with sitz baths if your symptoms get worse.  You have new symptoms. Do not continue with sitz baths until you talk with your health care provider. This information is not intended to replace advice given to you by your health care provider. Make sure you discuss any questions you have with your health care provider. Document Released: 02/12/2004 Document Revised: 10/20/2015 Document Reviewed: 05/20/2014 Elsevier Interactive Patient Education  2018 [place] Fissure, Adult An anal fissure is a small tear or crack in the skin around the opening of the butt (anus).Bleeding from the tear or crack usually stops on its own within a few minutes. The bleeding may happen every time you poop (have a bowel  movement) until the tear or crack heals. Follow these instructions at home: Eating and drinking  Avoid bananas and dairy products. These foods can make it hard to poop.  Drink enough fluid to keep your pee (urine) clear or pale yellow.  Eat a lot of fruit, whole grains, and vegetables. General instructions  Keep the butt area as clean and dry as you can.  Take a warm water bath (sitz bath) as told by your doctor. Do not use soap.  Take over-the-counter and prescription medicines only as told by your doctor.  Use creams or ointments only as told by your doctor.  Keep all follow-up visits as told by your doctor. This is important. Contact a doctor if:  You have more bleeding.  You have a fever.  You have watery poop (diarrhea) that is mixed with blood.  You have pain.  You problem gets worse, not better. This information is not intended to replace advice given to you by your health care provider. Make sure you discuss any questions you have with your health care provider. Document Released: 01/18/2011 Document Revised: 10/28/2015 Document Reviewed: 08/17/2014 Elsevier Interactive Patient Education  [employer].    The patient will be encouraged to make use of a daily fiber supplement.  Use cream two times daily.

## 2018-03-20 NOTE — Telephone Encounter (Signed)
Nifedipine ointment called to [facility].  

## 2018-03-22 ENCOUNTER — Encounter: Payer: Self-pay | Admitting: Surgery

## 2018-03-22 NOTE — Progress Notes (Signed)
Patient ID: Taylor Shaw, female   DOB: Jun 17, 1940, 77 y.o.   MRN: [id number]  HPI Taylor Shaw is a 77 y.o. female a history of anal fissure that I had seen for the last year or so and she responded to medical treatment including nifedipine and sitz baths now comes back for worsening anorectal pain.  She reports that it is a sharp pain worsening after she has a bowel movement.  She does have some occasional hematochezia.  The pain is moderate in intensity.  HPI  Past Medical History:  Diagnosis Date  . Arthritis   . Diabetes ([place]) 02/19/2015  . Diverticulosis 12/07/2016   Sigmoid Colon  . Diverticulosis of sigmoid colon 12/08/2016  . Elevated blood sugar 01/15/2015  . GERD (gastroesophageal reflux disease)   . Hemorrhoids   . Hyperlipidemia   . Hypertension   . Hypertension 01/15/2015  . Obesity 01/15/2015    Past Surgical History:  Procedure Laterality Date  . COLONOSCOPY    . COLONOSCOPY WITH PROPOFOL N/A 12/07/2016   Procedure: COLONOSCOPY WITH PROPOFOL;  Surgeon: [name], MD;  Location: [facility];  Service: Endoscopy;  Laterality: N/A;  Diabetic - oral meds  . POLYPECTOMY  12/07/2016   Procedure: POLYPECTOMY;  Surgeon: [name], MD;  Location: [facility];  Service: Endoscopy;;  . TOTAL HIP ARTHROPLASTY Bilateral   . TUBAL LIGATION      Family History  Problem Relation Age of Onset  . Dementia Mother   . Hypertension Mother   . Arthritis Mother   . Cancer Father        lung  . Cancer Daughter        breast  . Breast cancer Sister 81    Social History Social History   Tobacco Use  . Smoking status: Never Smoker  . Smokeless tobacco: Never Used  Substance Use Topics  . Alcohol use: No    Alcohol/week: 0.0 standard drinks  . Drug use: No    No Known Allergies  Current Outpatient Medications  Medication Sig Dispense Refill  . amLODipine (NORVASC) 10 MG tablet TAKE 1 TABLET(10 MG) BY MOUTH DAILY (Patient taking differently: Take 10  mg by mouth daily. ) 90 tablet 0  . atorvastatin (LIPITOR) 10 MG tablet TAKE 1 TABLET(10 MG) BY MOUTH DAILY (Patient taking differently: Take 10 mg by mouth daily. ) 90 tablet 0  . carvedilol (COREG) 25 MG tablet Take 25 mg by mouth 2 (two) times daily.  1  . metFORMIN (GLUCOPHAGE) 500 MG tablet TAKE 1 TABLET BY MOUTH ONCE DAILY WITH BREAKFAST (Patient taking differently: Take 500 mg by mouth daily with breakfast. ) 30 tablet 1  . Omega-3 Fatty Acids (FISH OIL) 1000 MG CAPS Take 1,000 mg by mouth 2 (two) times daily.     Marland Kitchen omeprazole (PRILOSEC) 20 MG capsule TAKE 1 CAPSULE(20 MG) BY MOUTH DAILY (Patient taking differently: Take 20 mg by mouth daily. ) 90 capsule 0  . UNABLE TO FIND Med Name: 0.3% Nifedipine + 1.5% Lidocaine in [facility]- Place on Anal area TID. 30 g 1  . valsartan-hydrochlorothiazide (DIOVAN-HCT) 320-12.5 MG tablet TAKE 1 TABLET BY MOUTH DAILY 90 tablet 0  . Calcium Carb-Cholecalciferol (CALCIUM 600+D) 600-800 MG-UNIT TABS Take 1 tablet by mouth 2 (two) times daily.     No current facility-administered medications for this visit.      Review of Systems Full ROS  was asked and was negative except for the information on the HPI  Physical  Exam Blood pressure (!) 144/82, pulse 86, temperature 97.7 F (36.5 C), temperature source Skin, height 5\' 4"  (1.626 m), weight 263 lb (119.3 kg). CONSTITUTIONAL: NAD obese female EYES: Pupils are equal, round, and reactive to light, Sclera are non-icteric. EARS, NOSE, MOUTH AND THROAT: The oropharynx is clear. The oral mucosa is pink and moist. Hearing is intact to voice. LYMPH NODES:  Lymph nodes in the neck are normal. RESPIRATORY:  Lungs are clear. There is normal respiratory effort, with equal breath sounds bilaterally, and without pathologic use of accessory muscles. CARDIOVASCULAR: Heart is regular without murmurs, gallops, or rubs. GI: The abdomen is soft, nontender, and nondistended. There are no palpable masses. There is no  hepatosplenomegaly. There are normal bowel sounds in all quadrants.  Rectal  : there is an increase in rectal tone, tender exam w evidence of a fissure post midline. No masses MUSCULOSKELETAL: Normal muscle strength and tone. No cyanosis or edema.   SKIN: Turgor is good and there are no pathologic skin lesions or ulcers. NEUROLOGIC: Motor and sensation is grossly normal. Cranial nerves are grossly intact. PSYCH:  Oriented to person, place and time. Affect is normal.  Data Reviewed  I have personally reviewed the patient's imaging, laboratory findings and medical records.    Assessment/Plan  ReCurrent anal fissure.  Discussed with the patient in detail and we will go ahead and restart her back on nifedipine cream, sitz baths and high-fiber.  Patient is interested in more definitive therapy which I think is reasonable given the recurrence that she had.  I do think that she will benefit from chemical sphincterotomy with botox as the next step in treatment.  We discussed with the patient in detail.  Risk benefit and possible complications including but not limited to: Bleeding, infection, recurrence, incontinence.  SHe understands and wishes to proceed we will plan for exam under anesthesia with chemical sphincterotomy.   Caroleen Hamman, MD FACS General Surgeon 03/22/2018, 10:08 AM

## 2018-03-22 NOTE — H&P (View-Only) (Signed)
Patient ID: Taylor Shaw, female   DOB: 05/01/1941, 77 y.o.   MRN: [id number]  HPI Taylor Shaw is a 77 y.o. female a history of anal fissure that I had seen for the last year or so and she responded to medical treatment including nifedipine and sitz baths now comes back for worsening anorectal pain.  She reports that it is a sharp pain worsening after she has a bowel movement.  She does have some occasional hematochezia.  The pain is moderate in intensity.  HPI  Past Medical History:  Diagnosis Date  . Arthritis   . Diabetes ([place]) 02/19/2015  . Diverticulosis 12/07/2016   Sigmoid Colon  . Diverticulosis of sigmoid colon 12/08/2016  . Elevated blood sugar 01/15/2015  . GERD (gastroesophageal reflux disease)   . Hemorrhoids   . Hyperlipidemia   . Hypertension   . Hypertension 01/15/2015  . Obesity 01/15/2015    Past Surgical History:  Procedure Laterality Date  . COLONOSCOPY    . COLONOSCOPY WITH PROPOFOL N/A 12/07/2016   Procedure: COLONOSCOPY WITH PROPOFOL;  Surgeon: [name], MD;  Location: [facility] [place];  Service: Endoscopy;  Laterality: N/A;  Diabetic - oral meds  . POLYPECTOMY  12/07/2016   Procedure: POLYPECTOMY;  Surgeon: [name], MD;  Location: [facility];  Service: Endoscopy;;  . TOTAL HIP ARTHROPLASTY Bilateral   . TUBAL LIGATION      Family History  Problem Relation Age of Onset  . Dementia Mother   . Hypertension Mother   . Arthritis Mother   . Cancer Father        lung  . Cancer Daughter        breast  . Breast cancer Sister 16    Social History Social History   Tobacco Use  . Smoking status: Never Smoker  . Smokeless tobacco: Never Used  Substance Use Topics  . Alcohol use: No    Alcohol/week: 0.0 standard drinks  . Drug use: No    No Known Allergies  Current Outpatient Medications  Medication Sig Dispense Refill  . amLODipine (NORVASC) 10 MG tablet TAKE 1 TABLET(10 MG) BY MOUTH DAILY (Patient taking differently: Take 10  mg by mouth daily. ) 90 tablet 0  . atorvastatin (LIPITOR) 10 MG tablet TAKE 1 TABLET(10 MG) BY MOUTH DAILY (Patient taking differently: Take 10 mg by mouth daily. ) 90 tablet 0  . carvedilol (COREG) 25 MG tablet Take 25 mg by mouth 2 (two) times daily.  1  . metFORMIN (GLUCOPHAGE) 500 MG tablet TAKE 1 TABLET BY MOUTH ONCE DAILY WITH BREAKFAST (Patient taking differently: Take 500 mg by mouth daily with breakfast. ) 30 tablet 1  . Omega-3 Fatty Acids (FISH OIL) 1000 MG CAPS Take 1,000 mg by mouth 2 (two) times daily.     Marland Kitchen omeprazole (PRILOSEC) 20 MG capsule TAKE 1 CAPSULE(20 MG) BY MOUTH DAILY (Patient taking differently: Take 20 mg by mouth daily. ) 90 capsule 0  . UNABLE TO FIND Med Name: 0.3% Nifedipine + 1.5% Lidocaine in [facility]- Place on Anal area TID. 30 g 1  . valsartan-hydrochlorothiazide (DIOVAN-HCT) 320-12.5 MG tablet TAKE 1 TABLET BY MOUTH DAILY 90 tablet 0  . Calcium Carb-Cholecalciferol (CALCIUM 600+D) 600-800 MG-UNIT TABS Take 1 tablet by mouth 2 (two) times daily.     No current facility-administered medications for this visit.      Review of Systems Full ROS  was asked and was negative except for the information on the HPI  Physical  Exam Blood pressure (!) 144/82, pulse 86, temperature 97.7 F (36.5 C), temperature source Skin, height 5\' 4"  (1.626 m), weight 263 lb (119.3 kg). CONSTITUTIONAL: NAD obese female EYES: Pupils are equal, round, and reactive to light, Sclera are non-icteric. EARS, NOSE, MOUTH AND THROAT: The oropharynx is clear. The oral mucosa is pink and moist. Hearing is intact to voice. LYMPH NODES:  Lymph nodes in the neck are normal. RESPIRATORY:  Lungs are clear. There is normal respiratory effort, with equal breath sounds bilaterally, and without pathologic use of accessory muscles. CARDIOVASCULAR: Heart is regular without murmurs, gallops, or rubs. GI: The abdomen is soft, nontender, and nondistended. There are no palpable masses. There is no  hepatosplenomegaly. There are normal bowel sounds in all quadrants.  Rectal  : there is an increase in rectal tone, tender exam w evidence of a fissure post midline. No masses MUSCULOSKELETAL: Normal muscle strength and tone. No cyanosis or edema.   SKIN: Turgor is good and there are no pathologic skin lesions or ulcers. NEUROLOGIC: Motor and sensation is grossly normal. Cranial nerves are grossly intact. PSYCH:  Oriented to person, place and time. Affect is normal.  Data Reviewed  I have personally reviewed the patient's imaging, laboratory findings and medical records.    Assessment/Plan  ReCurrent anal fissure.  Discussed with the patient in detail and we will go ahead and restart her back on nifedipine cream, sitz baths and high-fiber.  Patient is interested in more definitive therapy which I think is reasonable given the recurrence that she had.  I do think that she will benefit from chemical sphincterotomy with botox as the next step in treatment.  We discussed with the patient in detail.  Risk benefit and possible complications including but not limited to: Bleeding, infection, recurrence, incontinence.  SHe understands and wishes to proceed we will plan for exam under anesthesia with chemical sphincterotomy.   Caroleen Hamman, MD FACS General Surgeon 03/22/2018, 10:08 AM

## 2018-03-25 ENCOUNTER — Encounter
Admission: RE | Admit: 2018-03-25 | Discharge: 2018-03-25 | Disposition: A | Discharge status: Home / Self Care | Payer: Medicare HMO | Source: Ambulatory Visit | Attending: Surgery | Admitting: Surgery

## 2018-03-25 ENCOUNTER — Other Ambulatory Visit: Payer: Self-pay

## 2018-03-25 DIAGNOSIS — Z820 Family history of epilepsy and other diseases of the nervous system: Secondary | ICD-10-CM | POA: Diagnosis not present

## 2018-03-25 DIAGNOSIS — G4730 Sleep apnea, unspecified: Secondary | ICD-10-CM | POA: Diagnosis not present

## 2018-03-25 DIAGNOSIS — I10 Essential (primary) hypertension: Secondary | ICD-10-CM

## 2018-03-25 DIAGNOSIS — K649 Unspecified hemorrhoids: Secondary | ICD-10-CM | POA: Diagnosis not present

## 2018-03-25 DIAGNOSIS — Z01818 Encounter for other preprocedural examination: Secondary | ICD-10-CM | POA: Insufficient documentation

## 2018-03-25 DIAGNOSIS — Z7984 Long term (current) use of oral hypoglycemic drugs: Secondary | ICD-10-CM | POA: Diagnosis not present

## 2018-03-25 DIAGNOSIS — Z8249 Family history of ischemic heart disease and other diseases of the circulatory system: Secondary | ICD-10-CM | POA: Diagnosis not present

## 2018-03-25 DIAGNOSIS — Z8261 Family history of arthritis: Secondary | ICD-10-CM | POA: Diagnosis not present

## 2018-03-25 DIAGNOSIS — K5730 Diverticulosis of large intestine without perforation or abscess without bleeding: Secondary | ICD-10-CM | POA: Diagnosis not present

## 2018-03-25 DIAGNOSIS — K219 Gastro-esophageal reflux disease without esophagitis: Secondary | ICD-10-CM | POA: Diagnosis not present

## 2018-03-25 DIAGNOSIS — E785 Hyperlipidemia, unspecified: Secondary | ICD-10-CM | POA: Diagnosis not present

## 2018-03-25 DIAGNOSIS — Z79899 Other long term (current) drug therapy: Secondary | ICD-10-CM | POA: Diagnosis not present

## 2018-03-25 DIAGNOSIS — K602 Anal fissure, unspecified: Secondary | ICD-10-CM | POA: Diagnosis not present

## 2018-03-25 DIAGNOSIS — Z801 Family history of malignant neoplasm of trachea, bronchus and lung: Secondary | ICD-10-CM | POA: Diagnosis not present

## 2018-03-25 DIAGNOSIS — Z96643 Presence of artificial hip joint, bilateral: Secondary | ICD-10-CM | POA: Diagnosis not present

## 2018-03-25 DIAGNOSIS — E119 Type 2 diabetes mellitus without complications: Secondary | ICD-10-CM | POA: Insufficient documentation

## 2018-03-25 DIAGNOSIS — M1990 Unspecified osteoarthritis, unspecified site: Secondary | ICD-10-CM | POA: Diagnosis not present

## 2018-03-25 DIAGNOSIS — Z803 Family history of malignant neoplasm of breast: Secondary | ICD-10-CM | POA: Diagnosis not present

## 2018-03-25 DIAGNOSIS — Z86010 Personal history of colonic polyps: Secondary | ICD-10-CM | POA: Diagnosis not present

## 2018-03-25 DIAGNOSIS — E669 Obesity, unspecified: Secondary | ICD-10-CM | POA: Diagnosis not present

## 2018-03-25 HISTORY — DX: Sleep apnea, unspecified: G47.30

## 2018-03-25 LAB — BASIC METABOLIC PANEL
Anion gap: 7 (ref 5–15)
BUN: 11 mg/dL (ref 8–23)
CALCIUM: 9 mg/dL (ref 8.9–10.3)
CHLORIDE: 102 mmol/L (ref 98–111)
CO2: 31 mmol/L (ref 22–32)
Creatinine, Ser: 0.72 mg/dL (ref 0.44–1.00)
Glucose, Bld: 144 mg/dL — ABNORMAL HIGH (ref 70–99)
POTASSIUM: 3.7 mmol/L (ref 3.5–5.1)
Sodium: 140 mmol/L (ref 135–145)

## 2018-03-25 LAB — CBC
HCT: 37.4 % (ref 36.0–46.0)
HEMOGLOBIN: 11.5 g/dL — AB (ref 12.0–15.0)
MCH: 25.4 pg — AB (ref 26.0–34.0)
MCHC: 30.7 g/dL (ref 30.0–36.0)
MCV: 82.6 fL (ref 80.0–100.0)
Platelets: 261 10*3/uL (ref 150–400)
RBC: 4.53 MIL/uL (ref 3.87–5.11)
RDW: 13.7 % (ref 11.5–15.5)
WBC: 8.6 10*3/uL (ref 4.0–10.5)
nRBC: 0 % (ref 0.0–0.2)

## 2018-03-25 NOTE — Patient Instructions (Signed)
Your procedure is scheduled on: Wed 03/27/18 Report to [facility]. To find out your arrival time please call [phone] between 1PM - 3PM on Tue 03/26/18.  Remember: Instructions that are not followed completely may result in serious medical risk, up to and including death, or upon the discretion of your surgeon and anesthesiologist your surgery may need to be rescheduled.     _X__ 1. Do not eat food after midnight the night before your procedure.                 No gum chewing or hard candies. You may drink clear liquids up to 2 hours                 before you are scheduled to arrive for your surgery- DO not drink clear                 liquids within 2 hours of the start of your surgery.                 Clear Liquids include:  water, apple juice without pulp, clear carbohydrate                 drink such as Clearfast or Gatorade, Black Coffee or Tea (Do not add                 anything to coffee or tea).  __X__2.  On the morning of surgery brush your teeth with toothpaste and water, you                 may rinse your mouth with mouthwash if you wish.  Do not swallow any              toothpaste of mouthwash.     _X__ 3.  No Alcohol for 24 hours before or after surgery.   _X__ 4.  Do Not Smoke or use e-cigarettes For 24 Hours Prior to Your Surgery.                 Do not use any chewable tobacco products for at least 6 hours prior to                 surgery.  ____  5.  Bring all medications with you on the day of surgery if instructed.   __X__  6.  Notify your doctor if there is any change in your medical condition      (cold, fever, infections).     Do not wear jewelry, make-up, hairpins, clips or nail polish. Do not wear lotions, powders, or perfumes.  Do not shave 48 hours prior to surgery. Men may shave face and neck. Do not bring valuables to the hospital.    [facility] is not responsible for any belongings or  valuables.  Contacts, dentures/partials or body piercings may not be worn into surgery. Bring a case for your contacts, glasses or hearing aids, a denture cup will be supplied. Leave your suitcase in the car. After surgery it may be brought to your room. For patients admitted to the hospital, discharge time is determined by your treatment team.   Patients discharged the day of surgery will not be allowed to drive home.   Please read over the following fact sheets that you were given:   MRSA Information  __X__ Take these medicines the morning of surgery with A SIP OF WATER:  1. amLODipine (NORVASC)  2. carvedilol (COREG)  3. omeprazole (PRILOSEC)  4.  5.  6.  ____ Fleet Enema (as directed)   __X__ Use CHG Soap/SAGE wipes as directed  ____ Use inhalers on the day of surgery  __X__ Stop metformin/Janumet/Farxiga 2 days prior to surgery    ____ Take 1/2 of usual insulin dose the night before surgery. No insulin the morning          of surgery.   ____ Stop Blood Thinners Coumadin/Plavix/Xarelto/Pleta/Pradaxa/Eliquis/Effient/Aspirin  on   Or contact your Surgeon, Cardiologist or Medical Doctor regarding  ability to stop your blood thinners  __X__ Stop Anti-inflammatories 7 days before surgery such as Advil, Ibuprofen, Motrin,  BC or Goodies Powder, Naprosyn, Naproxen, Aleve, Aspirin    __X__ Stop all herbal supplements, fish oil or vitamin E until after surgery.    __X__ Bring C-Pap to the hospital.

## 2018-03-25 NOTE — Addendum Note (Signed)
Addended by: Caroleen Hamman F on: 03/25/2018 01:03 PM   Modules accepted: Orders

## 2018-03-27 ENCOUNTER — Ambulatory Visit
Admission: RE | Admit: 2018-03-27 | Discharge: 2018-03-27 | Disposition: A | Discharge status: Home / Self Care | Payer: Medicare HMO | Source: Ambulatory Visit | Attending: Surgery | Admitting: Surgery

## 2018-03-27 ENCOUNTER — Ambulatory Visit: Payer: Medicare HMO | Admitting: Registered Nurse

## 2018-03-27 ENCOUNTER — Encounter
Admission: RE | Disposition: A | Discharge status: Home / Self Care | Payer: Self-pay | Source: Ambulatory Visit | Attending: Surgery

## 2018-03-27 DIAGNOSIS — K602 Anal fissure, unspecified: Secondary | ICD-10-CM | POA: Diagnosis not present

## 2018-03-27 DIAGNOSIS — K5730 Diverticulosis of large intestine without perforation or abscess without bleeding: Secondary | ICD-10-CM | POA: Insufficient documentation

## 2018-03-27 DIAGNOSIS — Z96643 Presence of artificial hip joint, bilateral: Secondary | ICD-10-CM | POA: Insufficient documentation

## 2018-03-27 DIAGNOSIS — Z803 Family history of malignant neoplasm of breast: Secondary | ICD-10-CM | POA: Insufficient documentation

## 2018-03-27 DIAGNOSIS — Z820 Family history of epilepsy and other diseases of the nervous system: Secondary | ICD-10-CM | POA: Insufficient documentation

## 2018-03-27 DIAGNOSIS — E669 Obesity, unspecified: Secondary | ICD-10-CM | POA: Insufficient documentation

## 2018-03-27 DIAGNOSIS — Z8261 Family history of arthritis: Secondary | ICD-10-CM | POA: Insufficient documentation

## 2018-03-27 DIAGNOSIS — E119 Type 2 diabetes mellitus without complications: Secondary | ICD-10-CM | POA: Insufficient documentation

## 2018-03-27 DIAGNOSIS — Z7984 Long term (current) use of oral hypoglycemic drugs: Secondary | ICD-10-CM | POA: Insufficient documentation

## 2018-03-27 DIAGNOSIS — K219 Gastro-esophageal reflux disease without esophagitis: Secondary | ICD-10-CM | POA: Insufficient documentation

## 2018-03-27 DIAGNOSIS — I10 Essential (primary) hypertension: Secondary | ICD-10-CM | POA: Insufficient documentation

## 2018-03-27 DIAGNOSIS — Z79899 Other long term (current) drug therapy: Secondary | ICD-10-CM | POA: Insufficient documentation

## 2018-03-27 DIAGNOSIS — E785 Hyperlipidemia, unspecified: Secondary | ICD-10-CM | POA: Insufficient documentation

## 2018-03-27 DIAGNOSIS — K649 Unspecified hemorrhoids: Secondary | ICD-10-CM | POA: Insufficient documentation

## 2018-03-27 DIAGNOSIS — Z801 Family history of malignant neoplasm of trachea, bronchus and lung: Secondary | ICD-10-CM | POA: Insufficient documentation

## 2018-03-27 DIAGNOSIS — M1990 Unspecified osteoarthritis, unspecified site: Secondary | ICD-10-CM | POA: Insufficient documentation

## 2018-03-27 DIAGNOSIS — G4730 Sleep apnea, unspecified: Secondary | ICD-10-CM | POA: Insufficient documentation

## 2018-03-27 DIAGNOSIS — Z86010 Personal history of colonic polyps: Secondary | ICD-10-CM | POA: Insufficient documentation

## 2018-03-27 DIAGNOSIS — Z8249 Family history of ischemic heart disease and other diseases of the circulatory system: Secondary | ICD-10-CM | POA: Insufficient documentation

## 2018-03-27 HISTORY — PX: EVALUATION UNDER ANESTHESIA WITH ANAL FISSUROTOMY: SHX5622

## 2018-03-27 HISTORY — PX: BOTOX INJECTION: SHX5754

## 2018-03-27 LAB — GLUCOSE, CAPILLARY
Glucose-Capillary: 100 mg/dL — ABNORMAL HIGH (ref 70–99)
Glucose-Capillary: 102 mg/dL — ABNORMAL HIGH (ref 70–99)

## 2018-03-27 SURGERY — EXAM UNDER ANESTHESIA WITH ANAL FISSUROTOMY
Anesthesia: General | Site: Rectum

## 2018-03-27 MED ORDER — HYDROCODONE-ACETAMINOPHEN 5-325 MG PO TABS: 1.0000 | ORAL_TABLET | Freq: Four times a day (QID) | ORAL | 0 refills | Status: DC | PRN

## 2018-03-27 MED ORDER — SODIUM CHLORIDE 0.9 % IJ SOLN
INTRAMUSCULAR | Status: AC
  Filled 2018-03-27: qty 10

## 2018-03-27 MED ORDER — SODIUM CHLORIDE 0.9 % IV SOLN
INTRAVENOUS | Status: DC
  Administered 2018-03-27: 12:00:00 via INTRAVENOUS

## 2018-03-27 MED ORDER — SODIUM CHLORIDE 0.9 % IJ SOLN
INTRAMUSCULAR | Status: DC | PRN
  Administered 2018-03-27: 5 mL via SUBMUCOSAL

## 2018-03-27 MED ORDER — BUPIVACAINE LIPOSOME 1.3 % IJ SUSP
INTRAMUSCULAR | Status: AC
  Filled 2018-03-27: qty 20

## 2018-03-27 MED ORDER — ONDANSETRON HCL 4 MG/2ML IJ SOLN
INTRAMUSCULAR | Status: DC | PRN
  Administered 2018-03-27: 4 mg via INTRAVENOUS

## 2018-03-27 MED ORDER — LIDOCAINE HCL URETHRAL/MUCOSAL 2 % EX GEL
CUTANEOUS | Status: AC
  Filled 2018-03-27: qty 5

## 2018-03-27 MED ORDER — PROMETHAZINE HCL 25 MG/ML IJ SOLN: 6.2500 mg | INTRAMUSCULAR | Status: DC | PRN

## 2018-03-27 MED ORDER — ACETAMINOPHEN 160 MG/5ML PO SOLN
325.0000 mg | ORAL | Status: DC | PRN
  Filled 2018-03-27: qty 20.3

## 2018-03-27 MED ORDER — BUPIVACAINE HCL (PF) 0.5 % IJ SOLN
INTRAMUSCULAR | Status: AC
  Filled 2018-03-27: qty 30

## 2018-03-27 MED ORDER — BUPIVACAINE HCL (PF) 0.25 % IJ SOLN
INTRAMUSCULAR | Status: AC
  Filled 2018-03-27: qty 30

## 2018-03-27 MED ORDER — ACETAMINOPHEN 325 MG PO TABS: 325.0000 mg | ORAL_TABLET | ORAL | Status: DC | PRN

## 2018-03-27 MED ORDER — CHLORHEXIDINE GLUCONATE CLOTH 2 % EX PADS: 6.0000 | MEDICATED_PAD | Freq: Once | CUTANEOUS | Status: DC

## 2018-03-27 MED ORDER — BUPIVACAINE LIPOSOME 1.3 % IJ SUSP
INTRAMUSCULAR | Status: DC | PRN
  Administered 2018-03-27: 20 mL

## 2018-03-27 MED ORDER — BUPIVACAINE-EPINEPHRINE (PF) 0.25% -1:200000 IJ SOLN
INTRAMUSCULAR | Status: AC
  Filled 2018-03-27: qty 30

## 2018-03-27 MED ORDER — FENTANYL CITRATE (PF) 100 MCG/2ML IJ SOLN
INTRAMUSCULAR | Status: DC | PRN
  Administered 2018-03-27: 50 ug via INTRAVENOUS

## 2018-03-27 MED ORDER — PROPOFOL 10 MG/ML IV BOLUS
INTRAVENOUS | Status: AC
  Filled 2018-03-27: qty 20

## 2018-03-27 MED ORDER — BUPIVACAINE-EPINEPHRINE (PF) 0.25% -1:200000 IJ SOLN
INTRAMUSCULAR | Status: DC | PRN
  Administered 2018-03-27: 30 mL via PERINEURAL

## 2018-03-27 MED ORDER — FENTANYL CITRATE (PF) 100 MCG/2ML IJ SOLN: 25.0000 ug | INTRAMUSCULAR | Status: DC | PRN

## 2018-03-27 MED ORDER — GLYCOPYRROLATE 0.2 MG/ML IJ SOLN
INTRAMUSCULAR | Status: AC
  Filled 2018-03-27: qty 1

## 2018-03-27 MED ORDER — PROPOFOL 500 MG/50ML IV EMUL
INTRAVENOUS | Status: DC | PRN
  Administered 2018-03-27: 150 ug/kg/min via INTRAVENOUS

## 2018-03-27 MED ORDER — FENTANYL CITRATE (PF) 100 MCG/2ML IJ SOLN
INTRAMUSCULAR | Status: AC
  Filled 2018-03-27: qty 2

## 2018-03-27 MED ORDER — BUPIVACAINE-EPINEPHRINE (PF) 0.5% -1:200000 IJ SOLN
INTRAMUSCULAR | Status: AC
  Filled 2018-03-27: qty 30

## 2018-03-27 MED ORDER — HYDROCODONE-ACETAMINOPHEN 7.5-325 MG PO TABS
1.0000 | ORAL_TABLET | Freq: Once | ORAL | Status: DC | PRN
  Filled 2018-03-27: qty 1

## 2018-03-27 MED ORDER — LIDOCAINE HCL URETHRAL/MUCOSAL 2 % EX GEL
CUTANEOUS | Status: DC | PRN
  Administered 2018-03-27: 1 via TOPICAL

## 2018-03-27 MED ORDER — LIDOCAINE HCL (PF) 2 % IJ SOLN
INTRAMUSCULAR | Status: AC
  Filled 2018-03-27: qty 10

## 2018-03-27 MED ORDER — MEPERIDINE HCL 50 MG/ML IJ SOLN: 6.2500 mg | INTRAMUSCULAR | Status: DC | PRN

## 2018-03-27 MED ORDER — ONABOTULINUMTOXINA 100 UNITS IJ SOLR
50.0000 [IU] | Freq: Once | INTRAMUSCULAR | Status: DC
  Filled 2018-03-27: qty 100

## 2018-03-27 SURGICAL SUPPLY — 15 items
CANISTER SUCT 1200ML W/VALVE (MISCELLANEOUS) ×3 IMPLANT
COVER WAND RF STERILE (DRAPES) ×3 IMPLANT
ELECT REM PT RETURN 9FT ADLT (ELECTROSURGICAL) ×3
ELECTRODE REM PT RTRN 9FT ADLT (ELECTROSURGICAL) ×1 IMPLANT
GLOVE BIO SURGEON STRL SZ7 (GLOVE) ×3 IMPLANT
GOWN STRL REUS W/ TWL LRG LVL3 (GOWN DISPOSABLE) ×2 IMPLANT
GOWN STRL REUS W/TWL LRG LVL3 (GOWN DISPOSABLE) ×4
NEEDLE HYPO 22GX1.5 SAFETY (NEEDLE) ×6 IMPLANT
PACK BASIN MINOR ARMC (MISCELLANEOUS) ×3 IMPLANT
PAD ABD DERMACEA PRESS 5X9 (GAUZE/BANDAGES/DRESSINGS) ×3 IMPLANT
SOL PREP PVP 2OZ (MISCELLANEOUS) ×3
SOLUTION PREP PVP 2OZ (MISCELLANEOUS) ×1 IMPLANT
SPONGE LAP 18X18 RF (DISPOSABLE) ×3 IMPLANT
SURGILUBE 2OZ TUBE FLIPTOP (MISCELLANEOUS) ×3 IMPLANT
SYR 20CC LL (SYRINGE) ×3 IMPLANT

## 2018-03-27 NOTE — Anesthesia Procedure Notes (Signed)
Date/Time: 03/27/2018 12:24 PM Performed by: Doreen Salvage, CRNA Pre-anesthesia Checklist: Patient identified, Emergency Drugs available, Suction available and Patient being monitored Patient Re-evaluated:Patient Re-evaluated prior to induction Oxygen Delivery Method: Simple face mask Induction Type: IV induction Dental Injury: Teeth and Oropharynx as per pre-operative assessment

## 2018-03-27 NOTE — Anesthesia Preprocedure Evaluation (Addendum)
Anesthesia Evaluation  Patient identified by MRN, date of birth, ID band Patient awake    Reviewed: Allergy & Precautions, H&P , NPO status , reviewed documented beta blocker date and time   Airway Mallampati: II  TM Distance: >3 FB Neck ROM: full    Dental  (+) Chipped   Pulmonary sleep apnea and Continuous Positive Airway Pressure Ventilation ,    Pulmonary exam normal        Cardiovascular hypertension, Normal cardiovascular exam     Neuro/Psych    GI/Hepatic GERD  Medicated and Controlled,  Endo/Other  diabetes  Renal/GU      Musculoskeletal  (+) Arthritis ,   Abdominal   Peds  Hematology   Anesthesia Other Findings Past Medical History: No date: Arthritis 02/19/2015: Diabetes ([place]) 12/07/2016: Diverticulosis     Comment:  Sigmoid Colon 12/08/2016: Diverticulosis of sigmoid colon 01/15/2015: Elevated blood sugar No date: GERD (gastroesophageal reflux disease) No date: Hemorrhoids No date: Hyperlipidemia No date: Hypertension 01/15/2015: Hypertension 01/15/2015: Obesity No date: Sleep apnea  Past Surgical History: No date: COLONOSCOPY 12/07/2016: COLONOSCOPY WITH PROPOFOL; N/A     Comment:  Procedure: COLONOSCOPY WITH PROPOFOL;  Surgeon: [name], MD;  Location: [facility];  Service:               Endoscopy;  Laterality: N/A;  Diabetic - oral meds No date: JOINT REPLACEMENT 12/07/2016: POLYPECTOMY     Comment:  Procedure: POLYPECTOMY;  Surgeon: [name], MD;                Location: [facility];  Service: Endoscopy;; No date: TOTAL HIP ARTHROPLASTY; Bilateral No date: TUBAL LIGATION     Reproductive/Obstetrics                            Anesthesia Physical Anesthesia Plan  ASA: III  Anesthesia Plan: General   Post-op Pain Management:    Induction: Intravenous  PONV Risk Score and Plan: Treatment may vary due to age or medical  condition, Ondansetron, Midazolam, Dexamethasone and Metaclopromide  Airway Management Planned: Nasal Cannula, Natural Airway, LMA and Oral ETT  Additional Equipment:   Intra-op Plan:   Post-operative Plan:   Informed Consent: I have reviewed the patients History and Physical, chart, labs and discussed the procedure including the risks, benefits and alternatives for the proposed anesthesia with the patient or authorized representative who has indicated his/her understanding and acceptance.   Dental Advisory Given  Plan Discussed with: CRNA  Anesthesia Plan Comments:         Anesthesia Quick Evaluation

## 2018-03-27 NOTE — Discharge Instructions (Addendum)
Anal Fissure, Adult An anal fissure is a small tear or crack in the skin around the opening of the butt (anus).Bleeding from the tear or crack usually stops on its own within a few minutes. The bleeding may happen every time you poop (have a bowel movement) until the tear or crack heals. Follow these instructions at home: Eating and drinking  Avoid bananas and dairy products. These foods can make it hard to poop.  Drink enough fluid to keep your pee (urine) clear or pale yellow.  Eat a lot of fruit, whole grains, and vegetables. General instructions  Keep the butt area as clean and dry as you can.  Take a warm water bath (sitz bath) as told by your doctor. Do not use soap.  Take over-the-counter and prescription medicines only as told by your doctor.  Use creams or ointments only as told by your doctor.  Keep all follow-up visits as told by your doctor. This is important. Contact a doctor if:  You have more bleeding.  You have a fever.  You have watery poop (diarrhea) that is mixed with blood.  You have pain.  You problem gets worse, not better. This information is not intended to replace advice given to you by your health care provider. Make sure you discuss any questions you have with your health care provider. Document Released: 01/18/2011 Document Revised: 10/28/2015 Document Reviewed: 08/17/2014 Elsevier Interactive Patient Education  2018 [place]   1) The drugs that you were given will stay in your system until tomorrow so for the next 24 hours you should not:  A) Drive an automobile B) Make any legal decisions C) Drink any alcoholic beverage   2) You may resume regular meals tomorrow.  Today it is better to start with liquids and gradually work up to solid foods.  You may eat anything you prefer, but it is better to start with liquids, then soup and crackers, and gradually work up to solid  foods.   3) Please notify your doctor immediately if you have any unusual bleeding, trouble breathing, redness and pain at the surgery site, drainage, fever, or pain not relieved by medication.    4) Additional Instructions:        Please contact your physician with any problems or Same Day Surgery at [phone], Monday through Friday 6 am to 4 pm, or [place] at [facility] number at [phone].

## 2018-03-27 NOTE — Transfer of Care (Signed)
Immediate Anesthesia Transfer of Care Note  Patient: Taylor Shaw  Procedure(s) Performed: EXAM UNDER ANESTHESIA, CHEMICAL SPHINCTEROTOMY (N/A Rectum) BOTOX INJECTION (N/A Rectum)  Patient Location: PACU  Anesthesia Type:General  Level of Consciousness: awake, alert  and oriented  Airway & Oxygen Therapy: Patient Spontanous Breathing  Post-op Assessment: Report given to RN and Post -op Vital signs reviewed and stable  Post vital signs: Reviewed and stable  Last Vitals:  Vitals Value Taken Time  BP 149/97 03/27/2018  1:06 PM  Temp 36.2 C 03/27/2018  1:05 PM  Pulse 70 03/27/2018  1:06 PM  Resp 18 03/27/2018  1:06 PM  SpO2 94 % 03/27/2018  1:06 PM  Vitals shown include unvalidated device data.  Last Pain:  Vitals:   03/27/18 1305  TempSrc: Temporal         Complications: No apparent anesthesia complications

## 2018-03-27 NOTE — Interval H&P Note (Signed)
History and Physical Interval Note:  03/27/2018 11:37 AM  Taylor Shaw  has presented today for surgery, with the diagnosis of ANAL FISSURE  The various methods of treatment have been discussed with the patient and family. After consideration of risks, benefits and other options for treatment, the patient has consented to  Procedure(s): EXAM UNDER ANESTHESIA, CHEMICAL SPHINCTEROTOMY (N/A) BOTOX INJECTION (N/A) as a surgical intervention .  The patient's history has been reviewed, patient examined, no change in status, stable for surgery.  I have reviewed the patient's chart and labs.  Questions were answered to the patient's satisfaction.     [place]

## 2018-03-27 NOTE — Anesthesia Post-op Follow-up Note (Signed)
Anesthesia QCDR form completed.        

## 2018-03-27 NOTE — Op Note (Signed)
  03/27/2018  12:54 PM  PATIENT:  Taylor Shaw  77 y.o. female  PRE-OPERATIVE DIAGNOSIS:  Anal fissure  POST-OPERATIVE DIAGNOSIS:  Same  PROCEDURE:   Exam under anesthesia and chemical sphincterotomy  SURGEON:  Surgeon(s) and Role:    * Penny Frisbie F, MD - Primary  FINDINGS: Post anal fissure, grade III left posterolateral hemorrhoid  ANESTHESIA: GEneral   DICTATION:  Patient was explained about proceeding detail, risk benefits possible complications and a consent was obtained. The patient taken to the operating room and placed in the modified lithotomy position.  Exam showed post fissure and grade III hemorrhoid. No other masses. THe sphincter muscle was grasped between my finger and 50 IU botox injected.   Marcaine quarter percent with epinephrine  And liposomal marcaine was injected around the wound site. Needle and laparotomy counts were correct and there were no immediate complications.  Malya Cirillo Sarita Haver, MD

## 2018-03-28 ENCOUNTER — Encounter: Payer: Self-pay | Admitting: Surgery

## 2018-04-03 ENCOUNTER — Encounter: Payer: Self-pay | Admitting: Internal Medicine

## 2018-04-03 ENCOUNTER — Ambulatory Visit (INDEPENDENT_AMBULATORY_CARE_PROVIDER_SITE_OTHER): Payer: Medicare HMO | Admitting: Internal Medicine

## 2018-04-03 ENCOUNTER — Ambulatory Visit (INDEPENDENT_AMBULATORY_CARE_PROVIDER_SITE_OTHER): Payer: Medicare HMO

## 2018-04-03 VITALS — BP 140/70 | HR 85 | Temp 98.4°F | Ht 64.0 in | Wt 263.9 lb

## 2018-04-03 DIAGNOSIS — Z23 Encounter for immunization: Secondary | ICD-10-CM | POA: Diagnosis not present

## 2018-04-03 DIAGNOSIS — E119 Type 2 diabetes mellitus without complications: Secondary | ICD-10-CM | POA: Diagnosis not present

## 2018-04-03 DIAGNOSIS — E559 Vitamin D deficiency, unspecified: Secondary | ICD-10-CM

## 2018-04-03 DIAGNOSIS — M542 Cervicalgia: Secondary | ICD-10-CM | POA: Insufficient documentation

## 2018-04-03 DIAGNOSIS — K5790 Diverticulosis of intestine, part unspecified, without perforation or abscess without bleeding: Secondary | ICD-10-CM

## 2018-04-03 DIAGNOSIS — Z860100 Personal history of colon polyps, unspecified: Secondary | ICD-10-CM

## 2018-04-03 DIAGNOSIS — E785 Hyperlipidemia, unspecified: Secondary | ICD-10-CM

## 2018-04-03 DIAGNOSIS — K648 Other hemorrhoids: Secondary | ICD-10-CM

## 2018-04-03 DIAGNOSIS — M1990 Unspecified osteoarthritis, unspecified site: Secondary | ICD-10-CM

## 2018-04-03 DIAGNOSIS — Z9989 Dependence on other enabling machines and devices: Secondary | ICD-10-CM

## 2018-04-03 DIAGNOSIS — K219 Gastro-esophageal reflux disease without esophagitis: Secondary | ICD-10-CM

## 2018-04-03 DIAGNOSIS — I10 Essential (primary) hypertension: Secondary | ICD-10-CM

## 2018-04-03 DIAGNOSIS — K5730 Diverticulosis of large intestine without perforation or abscess without bleeding: Secondary | ICD-10-CM

## 2018-04-03 DIAGNOSIS — G4733 Obstructive sleep apnea (adult) (pediatric): Secondary | ICD-10-CM

## 2018-04-03 DIAGNOSIS — Z1329 Encounter for screening for other suspected endocrine disorder: Secondary | ICD-10-CM

## 2018-04-03 DIAGNOSIS — Z1231 Encounter for screening mammogram for malignant neoplasm of breast: Secondary | ICD-10-CM

## 2018-04-03 HISTORY — DX: Cervicalgia: M54.2

## 2018-04-03 HISTORY — DX: Other hemorrhoids: K64.8

## 2018-04-03 MED ORDER — LOSARTAN POTASSIUM-HCTZ 100-12.5 MG PO TABS: 1.0000 | ORAL_TABLET | Freq: Every day | ORAL | 3 refills | Status: DC

## 2018-04-03 NOTE — Patient Instructions (Addendum)
Please call to schedule [facility] 04/2018  Referred to [facility] for repeat sleep study Stop valsaratn-hctz 320-12.5 mg daily changed to losartan hctz 100-12.5    Hypertension Hypertension, commonly called high blood pressure, is when the force of blood pumping through the arteries is too strong. The arteries are the blood vessels that carry blood from the heart throughout the body. Hypertension forces the heart to work harder to pump blood and may cause arteries to become narrow or stiff. Having untreated or uncontrolled hypertension can cause heart attacks, strokes, kidney disease, and other problems. A blood pressure reading consists of a higher number over a lower number. Ideally, your blood pressure should be below 120/80. The first ("top") number is called the systolic pressure. It is a measure of the pressure in your arteries as your heart beats. The second ("bottom") number is called the diastolic pressure. It is a measure of the pressure in your arteries as the heart relaxes. What are the causes? The cause of this condition is not known. What increases the risk? Some risk factors for high blood pressure are under your control. Others are not. Factors you can change  Smoking.  Having type 2 diabetes mellitus, high cholesterol, or both.  Not getting enough exercise or physical activity.  Being overweight.  Having too much fat, sugar, calories, or salt (sodium) in your diet.  Drinking too much alcohol. Factors that are difficult or impossible to change  Having chronic kidney disease.  Having a family history of high blood pressure.  Age. Risk increases with age.  Race. You may be at higher risk if you are African-American.  Gender. Men are at higher risk than women before age 32. After age 34, women are at higher risk than men.  Having obstructive sleep apnea.  Stress. What are the signs or symptoms? Extremely high blood pressure (hypertensive crisis) may  cause:  Headache.  Anxiety.  Shortness of breath.  Nosebleed.  Nausea and vomiting.  Severe chest pain.  Jerky movements you cannot control (seizures).  How is this diagnosed? This condition is diagnosed by measuring your blood pressure while you are seated, with your arm resting on a surface. The cuff of the blood pressure monitor will be placed directly against the skin of your upper arm at the level of your heart. It should be measured at least twice using the same arm. Certain conditions can cause a difference in blood pressure between your right and left arms. Certain factors can cause blood pressure readings to be lower or higher than normal (elevated) for a short period of time:  When your blood pressure is higher when you are in a health care provider's office than when you are at home, this is called white coat hypertension. Most people with this condition do not need medicines.  When your blood pressure is higher at home than when you are in a health care provider's office, this is called masked hypertension. Most people with this condition may need medicines to control blood pressure.  If you have a high blood pressure reading during one visit or you have normal blood pressure with other risk factors:  You may be asked to return on a different day to have your blood pressure checked again.  You may be asked to monitor your blood pressure at home for 1 week or longer.  If you are diagnosed with hypertension, you may have other blood or imaging tests to help your health care provider understand your overall risk  for other conditions. How is this treated? This condition is treated by making healthy lifestyle changes, such as eating healthy foods, exercising more, and reducing your alcohol intake. Your health care provider may prescribe medicine if lifestyle changes are not enough to get your blood pressure under control, and if:  Your systolic blood pressure is above  130.  Your diastolic blood pressure is above 80.  Your personal target blood pressure may vary depending on your medical conditions, your age, and other factors. Follow these instructions at home: Eating and drinking  Eat a diet that is high in fiber and potassium, and low in sodium, added sugar, and fat. An example eating plan is called the DASH (Dietary Approaches to Stop Hypertension) diet. To eat this way: ? Eat plenty of fresh fruits and vegetables. Try to fill half of your plate at each meal with fruits and vegetables. ? Eat whole grains, such as whole wheat pasta, brown rice, or whole grain bread. Fill about one quarter of your plate with whole grains. ? Eat or drink low-fat dairy products, such as skim milk or low-fat yogurt. ? Avoid fatty cuts of meat, processed or cured meats, and poultry with skin. Fill about one quarter of your plate with lean proteins, such as fish, chicken without skin, beans, eggs, and tofu. ? Avoid premade and processed foods. These tend to be higher in sodium, added sugar, and fat.  Reduce your daily sodium intake. Most people with hypertension should eat less than 1,500 mg of sodium a day.  Limit alcohol intake to no more than 1 drink a day for nonpregnant women and 2 drinks a day for men. One drink equals 12 oz of beer, 5 oz of wine, or 1 oz of hard liquor. Lifestyle  Work with your health care provider to maintain a healthy body weight or to lose weight. Ask what an ideal weight is for you.  Get at least 30 minutes of exercise that causes your heart to beat faster (aerobic exercise) most days of the week. Activities may include walking, swimming, or biking.  Include exercise to strengthen your muscles (resistance exercise), such as pilates or lifting weights, as part of your weekly exercise routine. Try to do these types of exercises for 30 minutes at least 3 days a week.  Do not use any products that contain nicotine or tobacco, such as cigarettes and  e-cigarettes. If you need help quitting, ask your health care provider.  Monitor your blood pressure at home as told by your health care provider.  Keep all follow-up visits as told by your health care provider. This is important. Medicines  Take over-the-counter and prescription medicines only as told by your health care provider. Follow directions carefully. Blood pressure medicines must be taken as prescribed.  Do not skip doses of blood pressure medicine. Doing this puts you at risk for problems and can make the medicine less effective.  Ask your health care provider about side effects or reactions to medicines that you should watch for. Contact a health care provider if:  You think you are having a reaction to a medicine you are taking.  You have headaches that keep coming back (recurring).  You feel dizzy.  You have swelling in your ankles.  You have trouble with your vision. Get help right away if:  You develop a severe headache or confusion.  You have unusual weakness or numbness.  You feel faint.  You have severe pain in your chest or abdomen.  You vomit repeatedly.  You have trouble breathing. Summary  Hypertension is when the force of blood pumping through your arteries is too strong. If this condition is not controlled, it may put you at risk for serious complications.  Your personal target blood pressure may vary depending on your medical conditions, your age, and other factors. For most people, a normal blood pressure is less than 120/80.  Hypertension is treated with lifestyle changes, medicines, or a combination of both. Lifestyle changes include weight loss, eating a healthy, low-sodium diet, exercising more, and limiting alcohol. This information is not intended to replace advice given to you by your health care provider. Make sure you discuss any questions you have with your health care provider. Document Released: 05/22/2005 Document Revised: 04/19/2016  Document Reviewed: 04/19/2016 Elsevier Interactive Patient Education  [employer].

## 2018-04-03 NOTE — Progress Notes (Signed)
Pre visit review using our clinic review tool, if applicable. No additional management support is needed unless otherwise documented below in the visit note. 

## 2018-04-03 NOTE — Progress Notes (Addendum)
Chief Complaint  Patient presents with  . Establish Care   New patient   1. DM 2 A1C 08/2017 6.6 on metformin 500 mg qd  2. HTN/HLD on norvasc 10 mg qd, coreg 25 mg bid, diovan-hct 320-12.5 mg qd  3. Anal fissure s/p surgery Dr. Dahlia Byes 03/2018  4. GERD on prilosec and helping  5. Right neck pain with reduced ROM looking left r/o OA h/o OA. Nothing tried pain x 2 weeks mild  6. OSA on cpap last sleep study 12/2010 needs repeat will refer to pulm for repeat sleep study   Review of Systems  Constitutional: Negative for weight loss.  HENT: Negative for hearing loss.   Eyes: Negative for blurred vision.  Respiratory: Negative for shortness of breath.   Cardiovascular: Negative for chest pain.  Gastrointestinal: Negative for abdominal pain.  Musculoskeletal: Positive for neck pain.  Skin: Negative for rash.  Neurological: Negative for headaches.  Psychiatric/Behavioral: Negative for depression and memory loss.   Past Medical History:  Diagnosis Date  . Arthritis   . Diabetes ([place]) 02/19/2015  . Diverticulosis 12/07/2016   Sigmoid Colon  . Diverticulosis   . Diverticulosis of sigmoid colon 12/08/2016  . Elevated blood sugar 01/15/2015  . GERD (gastroesophageal reflux disease)   . Hemorrhoids   . Hyperlipidemia   . Hypertension   . Hypertension 01/15/2015  . Obesity 01/15/2015  . OSA on CPAP   . Sleep apnea    Past Surgical History:  Procedure Laterality Date  . BOTOX INJECTION N/A 03/27/2018   Procedure: BOTOX INJECTION;  Surgeon: Jules Husbands, MD;  Location: [facility];  Service: General;  Laterality: N/A;  . COLONOSCOPY    . COLONOSCOPY WITH PROPOFOL N/A 12/07/2016   Procedure: COLONOSCOPY WITH PROPOFOL;  Surgeon: [name], MD;  Location: [place];  Service: Endoscopy;  Laterality: N/A;  Diabetic - oral meds  . EVALUATION UNDER ANESTHESIA WITH ANAL FISSUROTOMY N/A 03/27/2018   Procedure: EXAM UNDER ANESTHESIA, CHEMICAL SPHINCTEROTOMY;  Surgeon: Jules Husbands, MD;   Location: [facility];  Service: General;  Laterality: N/A;  . JOINT REPLACEMENT     b/l hips in [place] 2004 and 2005 Dr. Alfonso Ramus   . POLYPECTOMY  12/07/2016   Procedure: POLYPECTOMY;  Surgeon: [name], MD;  Location: [place];  Service: Endoscopy;;  . TOTAL HIP ARTHROPLASTY Bilateral   . TUBAL LIGATION     Family History  Problem Relation Age of Onset  . Dementia Mother   . Hypertension Mother   . Arthritis Mother   . Alcohol abuse Father   . Cirrhosis Father   . Cancer Daughter        breast  . COPD Brother   . Breast cancer Sister 11  . Cancer Sister        ? type   . Cancer Brother        ?type  . Cancer Brother        cancer ?type  . Cancer Daughter        cancer ?type died 64    Social History   Socioeconomic History  . Marital status: Divorced    Spouse name: Not on file  . Number of children: Not on file  . Years of education: Not on file  . Highest education level: Not on file  Occupational History  . Not on file  Social Needs  . Financial resource strain: Not on file  . Food insecurity:    Worry: Not on file  Inability: Not on file  . Transportation needs:    Medical: Not on file    Non-medical: Not on file  Tobacco Use  . Smoking status: Never Smoker  . Smokeless tobacco: Never Used  Substance and Sexual Activity  . Alcohol use: No    Alcohol/week: 0.0 standard drinks  . Drug use: No  . Sexual activity: Not on file  Lifestyle  . Physical activity:    Days per week: Not on file    Minutes per session: Not on file  . Stress: Not on file  Relationships  . Social connections:    Talks on phone: Not on file    Gets together: Not on file    Attends religious service: Not on file    Active member of club or organization: Not on file    Attends meetings of clubs or organizations: Not on file    Relationship status: Not on file  . Intimate partner violence:    Fear of current or ex partner: Not on file    Emotionally abused: Not  on file    Physically abused: Not on file    Forced sexual activity: Not on file  Other Topics Concern  . Not on file  Social History Narrative   Lives with daughter [name] to go to church    Moved from [place] 3-4 years ago    No guns, wears seat belt    Never smoker    Current Meds  Medication Sig  . amLODipine (NORVASC) 10 MG tablet TAKE 1 TABLET(10 MG) BY MOUTH DAILY (Patient taking differently: Take 10 mg by mouth daily. )  . atorvastatin (LIPITOR) 10 MG tablet TAKE 1 TABLET(10 MG) BY MOUTH DAILY (Patient taking differently: Take 10 mg by mouth at bedtime. )  . Calcium Carb-Cholecalciferol (CALCIUM 600+D) 600-800 MG-UNIT TABS Take 1 tablet by mouth 2 (two) times daily.  . carvedilol (COREG) 25 MG tablet Take 25 mg by mouth 2 (two) times daily.  Marland Kitchen HYDROcodone-acetaminophen (NORCO/VICODIN) 5-325 MG tablet Take 1 tablet by mouth every 6 (six) hours as needed for moderate pain.  . metFORMIN (GLUCOPHAGE) 500 MG tablet TAKE 1 TABLET BY MOUTH ONCE DAILY WITH BREAKFAST (Patient taking differently: Take 500 mg by mouth daily with breakfast. )  . Omega-3 Fatty Acids (FISH OIL) 1000 MG CAPS Take 1,000 mg by mouth 2 (two) times daily.   Marland Kitchen omeprazole (PRILOSEC) 20 MG capsule TAKE 1 CAPSULE(20 MG) BY MOUTH DAILY (Patient taking differently: Take 20 mg by mouth daily. )  . UNABLE TO FIND Med Name: 0.3% Nifedipine + 1.5% Lidocaine in [facility]- Place on Anal area TID.  . [DISCONTINUED] valsartan-hydrochlorothiazide (DIOVAN-HCT) 320-12.5 MG tablet TAKE 1 TABLET BY MOUTH DAILY   No Known Allergies Recent Results (from the past 2160 hour(s))  CBC     Status: Abnormal   Collection Time: 03/25/18  1:53 PM  Result Value Ref Range   WBC 8.6 4.0 - 10.5 K/uL   RBC 4.53 3.87 - 5.11 MIL/uL   Hemoglobin 11.5 (L) 12.0 - 15.0 g/dL   HCT 37.4 36.0 - 46.0 %   MCV 82.6 80.0 - 100.0 fL   MCH 25.4 (L) 26.0 - 34.0 pg   MCHC 30.7 30.0 - 36.0 g/dL   RDW 13.7 11.5 - 15.5 %   Platelets 261 150 -  400 K/uL   nRBC 0.0 0.0 - 0.2 %    Comment: Performed at [facility], [address]  [place], [place] [address]  Basic metabolic panel     Status: Abnormal   Collection Time: 03/25/18  1:53 PM  Result Value Ref Range   Sodium 140 135 - 145 mmol/L   Potassium 3.7 3.5 - 5.1 mmol/L   Chloride 102 98 - 111 mmol/L   CO2 31 22 - 32 mmol/L   Glucose, Bld 144 (H) 70 - 99 mg/dL   BUN 11 8 - 23 mg/dL   Creatinine, Ser 0.72 0.44 - 1.00 mg/dL   Calcium 9.0 8.9 - 10.3 mg/dL   GFR calc non Af Amer >60 >60 mL/min   GFR calc Af Amer >60 >60 mL/min    Comment: (NOTE) The eGFR has been calculated using the CKD EPI equation. This calculation has not been validated in all clinical situations. eGFR's persistently <60 mL/min signify possible Chronic Kidney Disease.    Anion gap 7 5 - 15    Comment: Performed at [facility], [address] [place], [place] [address]  Glucose, capillary     Status: Abnormal   Collection Time: 03/27/18 11:01 AM  Result Value Ref Range   Glucose-Capillary 100 (H) 70 - 99 mg/dL  Glucose, capillary     Status: Abnormal   Collection Time: 03/27/18  1:22 PM  Result Value Ref Range   Glucose-Capillary 102 (H) 70 - 99 mg/dL   Objective  Body mass index is 45.3 kg/m. Wt Readings from Last 3 Encounters:  04/03/18 263 lb 14.2 oz (119.7 kg)  03/25/18 261 lb (118.4 kg)  03/20/18 263 lb (119.3 kg)   Temp Readings from Last 3 Encounters:  04/03/18 98.4 F (36.9 C) (Oral)  03/27/18 98.2 F (36.8 C) (Temporal)  03/20/18 97.7 F (36.5 C) (Skin)   BP Readings from Last 3 Encounters:  04/03/18 140/70  03/27/18 (!) 170/60  03/25/18 (!) 164/76   Pulse Readings from Last 3 Encounters:  04/03/18 85  03/27/18 (!) 51  03/25/18 (!) 58    Physical Exam  Constitutional: She is oriented to person, place, and time. Vital signs are normal. She appears well-developed and well-nourished. She is cooperative.  HENT:  Head: Normocephalic and atraumatic.   Mouth/Throat: Oropharynx is clear and moist and mucous membranes are normal.  Eyes: Pupils are equal, round, and reactive to light. Conjunctivae are normal.  Cardiovascular: Normal rate, regular rhythm and normal heart sounds.  Pulmonary/Chest: Effort normal and breath sounds normal.  Musculoskeletal:       Cervical back: She exhibits decreased range of motion.  Reduced ROM looking left  SCM mild ttp    Neurological: She is alert and oriented to person, place, and time. Gait normal.  Skin: Skin is warm, dry and intact.  Psychiatric: She has a normal mood and affect. Her speech is normal and behavior is normal. Judgment and thought content normal. Cognition and memory are normal.  Nursing note and vitals reviewed.   Assessment   1. DM 2 A1C 08/2017 6.6  2. HTN/HLD  3. Anal fissure s/p surgery Dr. Dahlia Byes 03/2018  4. GERD  5. Right neck pain with reduced ROM looking left r/o OA h/o OA 6. OSA on cpap last sleep study 12/2010 needs repeat  7. HM Plan  1. Cont metformin 500 mg qd  Check fasting labs another day urine and b/w  Check foot exam at f/u  rec sch [place] eye appt 04/2018 2. Cont meds but change diovan 320-12.5 to losartan 100-12.5  Cont coreg 25 mg bid and norvasc 10 mg qd  3. F/u surgery prn  On fiber to help with stools  4. Cont prilosec  5. Xray neck today  Consider PT  Disc steroid shot pt does not want to do  If Xray neg consider CT neck soft tissue to w/u right SCM pain and r/o lymph node  6. Referred pulm repeat sleep study  7. sch fasting labs   Flu shot given today  Consider Tdap in future  pna 24 had 03/16/14 consider prevnar in future  Had zostervax consider shingirix in future   Mammogram referred today due 08/02/18 set up [place]  Out of age window pap  Colonoscopy had 12/07/16 IH, diverticulosis and tubular adenoma f/u in 5 years Dr. Allen Norris  DEXA get report prior PCP Dr. Quillian [facility] pt had 5 years ago   Prior PCP [facility] Dr.  Johnanna Schneiders   CXR 07/20/2018 mild venous congestion otherwise neg OA changes spine Provider: Dr. Olivia Mackie McLean-Scocuzza-Internal Medicine

## 2018-04-04 ENCOUNTER — Other Ambulatory Visit (INDEPENDENT_AMBULATORY_CARE_PROVIDER_SITE_OTHER): Payer: Medicare HMO

## 2018-04-04 DIAGNOSIS — E119 Type 2 diabetes mellitus without complications: Secondary | ICD-10-CM

## 2018-04-04 DIAGNOSIS — I10 Essential (primary) hypertension: Secondary | ICD-10-CM

## 2018-04-04 DIAGNOSIS — Z1329 Encounter for screening for other suspected endocrine disorder: Secondary | ICD-10-CM | POA: Diagnosis not present

## 2018-04-04 DIAGNOSIS — E559 Vitamin D deficiency, unspecified: Secondary | ICD-10-CM

## 2018-04-04 LAB — CBC WITH DIFFERENTIAL/PLATELET
BASOS ABS: 0.1 10*3/uL (ref 0.0–0.1)
Basophils Relative: 1 % (ref 0.0–3.0)
Eosinophils Absolute: 0.1 10*3/uL (ref 0.0–0.7)
Eosinophils Relative: 1.7 % (ref 0.0–5.0)
HCT: 37.1 % (ref 36.0–46.0)
Hemoglobin: 12 g/dL (ref 12.0–15.0)
LYMPHS ABS: 2.1 10*3/uL (ref 0.7–4.0)
LYMPHS PCT: 26.5 % (ref 12.0–46.0)
MCHC: 32.3 g/dL (ref 30.0–36.0)
MCV: 80.5 fl (ref 78.0–100.0)
MONOS PCT: 7.9 % (ref 3.0–12.0)
Monocytes Absolute: 0.6 10*3/uL (ref 0.1–1.0)
NEUTROS ABS: 5 10*3/uL (ref 1.4–7.7)
NEUTROS PCT: 62.9 % (ref 43.0–77.0)
PLATELETS: 227 10*3/uL (ref 150.0–400.0)
RBC: 4.61 Mil/uL (ref 3.87–5.11)
RDW: 14.5 % (ref 11.5–15.5)
WBC: 8 10*3/uL (ref 4.0–10.5)

## 2018-04-04 LAB — COMPREHENSIVE METABOLIC PANEL
ALBUMIN: 3.9 g/dL (ref 3.5–5.2)
ALK PHOS: 58 U/L (ref 39–117)
ALT: 8 U/L (ref 0–35)
AST: 16 U/L (ref 0–37)
BILIRUBIN TOTAL: 0.8 mg/dL (ref 0.2–1.2)
BUN: 16 mg/dL (ref 6–23)
CALCIUM: 9.7 mg/dL (ref 8.4–10.5)
CO2: 34 mEq/L — ABNORMAL HIGH (ref 19–32)
CREATININE: 0.79 mg/dL (ref 0.40–1.20)
Chloride: 101 mEq/L (ref 96–112)
GFR: 90.98 mL/min (ref >60.00)
Glucose, Bld: 101 mg/dL — ABNORMAL HIGH (ref 70–99)
Potassium: 4 mEq/L (ref 3.5–5.1)
Sodium: 140 mEq/L (ref 135–145)
TOTAL PROTEIN: 7.1 g/dL (ref 6.0–8.3)

## 2018-04-04 LAB — TSH: TSH: 4.39 u[IU]/mL (ref 0.35–4.50)

## 2018-04-04 LAB — LIPID PANEL
CHOLESTEROL: 129 mg/dL (ref 0–200)
HDL: 58.1 mg/dL (ref >39.00)
LDL CALC: 59 mg/dL (ref 0–99)
NonHDL: 71.11
TRIGLYCERIDES: 60 mg/dL (ref 0.0–149.0)
Total CHOL/HDL Ratio: 2
VLDL: 12 mg/dL (ref 0.0–40.0)

## 2018-04-04 LAB — VITAMIN D 25 HYDROXY (VIT D DEFICIENCY, FRACTURES): VITD: 61.55 ng/mL (ref 30.00–100.00)

## 2018-04-04 LAB — HEMOGLOBIN A1C: Hgb A1c MFr Bld: 6.2 % (ref 4.6–6.5)

## 2018-04-04 NOTE — Anesthesia Postprocedure Evaluation (Signed)
Anesthesia Post Note  Patient: Mishell Donalson Hagos  Procedure(s) Performed: EXAM UNDER ANESTHESIA, CHEMICAL SPHINCTEROTOMY (N/A Rectum) BOTOX INJECTION (N/A Rectum)  Patient location during evaluation: PACU Anesthesia Type: General Level of consciousness: awake and alert Pain management: pain level controlled Vital Signs Assessment: post-procedure vital signs reviewed and stable Respiratory status: spontaneous breathing, nonlabored ventilation and respiratory function stable Cardiovascular status: blood pressure returned to baseline and stable Postop Assessment: no apparent nausea or vomiting Anesthetic complications: no     Last Vitals:  Vitals:   03/27/18 1335 03/27/18 1347  BP: (!) 156/84 (!) 170/60  Pulse: (!) 55 (!) 51  Resp: 14 18  Temp: (!) 36.2 C 36.8 C  SpO2: 97% 99%    Last Pain:  Vitals:   03/28/18 0827  TempSrc:   PainSc: 0-No pain                 Alphonsus Sias

## 2018-04-04 NOTE — Addendum Note (Signed)
Addended by: Leeanne Rio on: 04/04/2018 10:03 AM   Modules accepted: Orders

## 2018-04-05 LAB — MICROALBUMIN / CREATININE URINE RATIO
Creatinine, Urine: 44.9 mg/dL
Microalb/Creat Ratio: 19.4 mg/g creat (ref 0.0–30.0)
Microalbumin, Urine: 8.7 ug/mL

## 2018-04-05 LAB — URINALYSIS, ROUTINE W REFLEX MICROSCOPIC
BILIRUBIN UA: NEGATIVE
Glucose, UA: NEGATIVE
Ketones, UA: NEGATIVE
Nitrite, UA: NEGATIVE
PH UA: 8 — AB (ref 5.0–7.5)
Protein, UA: NEGATIVE
RBC UA: NEGATIVE
SPEC GRAV UA: 1.008 (ref 1.005–1.030)
UUROB: 0.2 mg/dL (ref 0.2–1.0)

## 2018-04-05 LAB — MICROSCOPIC EXAMINATION
CASTS: NONE SEEN /LPF
Epithelial Cells (non renal): >10 /hpf — AB (ref 0–10)

## 2018-04-09 ENCOUNTER — Encounter: Payer: Self-pay | Admitting: Internal Medicine

## 2018-04-09 ENCOUNTER — Ambulatory Visit (INDEPENDENT_AMBULATORY_CARE_PROVIDER_SITE_OTHER): Payer: Medicare HMO | Admitting: Internal Medicine

## 2018-04-09 VITALS — BP 124/70 | HR 78 | Resp 16 | Ht 64.0 in | Wt 260.0 lb

## 2018-04-09 DIAGNOSIS — R0609 Other forms of dyspnea: Secondary | ICD-10-CM

## 2018-04-09 DIAGNOSIS — G4733 Obstructive sleep apnea (adult) (pediatric): Secondary | ICD-10-CM | POA: Diagnosis not present

## 2018-04-09 NOTE — Progress Notes (Addendum)
[facility] Consultation      Assessment and Plan:  Obstructive sleep apnea. - Patient has a history of obstructive sleep apnea. - Her current machine is about 76 years old, however on download of her device shows that her sleep apnea is actually very well controlled with current device.  We discussed about whether we would put in for a new device given the age of her machine.  She is concerned about the cost of obtaining a new device, therefore we will continue with the current device until it has trouble working, and can then order her a new device. - Instructed to continue to rinse out tubing at least once per week, and continue to use CPAP nightly. - We will need to obtain outside previous sleep records.  Dyspnea on exertion. - Patient is a lifelong non-smoker, she has some occupational exposures, but I suspect that her dyspnea is most likely mostly from deconditioning and obesity. - Discussed increasing activity and weight loss. -I will also check a baseline chest x-ray and pulmonary function test to rule out obstructive lung disease.  Obesity. - Morbid obesity with BMI 44.  Likely contributing to dyspnea. - Weight loss would be beneficial for her breathing and health overall. -We also discussed increasing her physical activity will help with her breathing.  I recommended that she walk at a slow pace for 20 minutes/day.  Orders Placed This Encounter  Procedures  . DG Chest 2 View  . Pulmonary Function Test [facility] Only   Return in about 6 months (around 10/08/2018).   Date: 04/09/2018  MRN# [id number] Taylor Shaw May 26, 1941    Taylor Shaw is a 77 y.o. old female seen in consultation for chief complaint of:    Chief Complaint  Patient presents with  . Consult    Referred by Taylor Shaw for management of OSA:  . Sleep Apnea    pt moved here from [facility]. She has not noticed many sx but here pcp felt she may need some adjustments on cpap.     HPI:   She has been on CPAP, originally about 10 years ago, she then stopped using it for 2-3 years,  But then started after her doctors advised her to restart less than a year ago. Her current machine is about 76 years old.  She [place] to [place] at [place], wakes 5-6 am, wears CPAP the entire night and feels rested in the morning. She does not nap during the day, she is not sleepy during the day.   She gets a bit winded during the day when she is walking a lot. She does not exercise. 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  She has never been diagnosed with respiratory problems in the past, no asthma.  She has reflux, controlled by medication, she has mild sinus drainage. She denies cough.   **CPAP download data 01/10/2018-04/09/2018.>>  Raw data personally reviewed uses greater than 4 hours is 86/90 days.  Average usage on days used 7 hours 20 minutes.  Set pressure is 16 CPAP.  Leaks are minimal.  Residual AHI 0.6.  Overall this shows very good compliance with CPAP with excellent control of obstructive sleep apnea.  Addendum: Reviewed outpatient records Review of outside sleep studies and outside records including office visit  notes from neuro and sleep clinic of [place] received and reviewed, 32 minutes spent, summarized below. **CPAP titration study 01/25/2011 CPAP titrated to 16. **Baseline PSG 12/14/2010: Severe obstructive sleep apnea with AHI of 39.  CPAP titration was recommended.  PMHX:   Past Medical History:  Diagnosis Date  . Arthritis   . Diabetes ([place]) 02/19/2015  . Diverticulosis 12/07/2016   Sigmoid Colon  . Diverticulosis   . Diverticulosis of sigmoid colon 12/08/2016  . Elevated blood sugar 01/15/2015  . GERD (gastroesophageal reflux disease)   . Hemorrhoids   . Hyperlipidemia   . Hypertension   .  Hypertension 01/15/2015  . Obesity 01/15/2015  . OSA on CPAP   . Sleep apnea    Surgical Hx:  Past Surgical History:  Procedure Laterality Date  . BOTOX INJECTION N/A 03/27/2018   Procedure: BOTOX INJECTION;  Surgeon: Jules Husbands, MD;  Location: [facility];  Service: General;  Laterality: N/A;  . COLONOSCOPY    . COLONOSCOPY WITH PROPOFOL N/A 12/07/2016   Procedure: COLONOSCOPY WITH PROPOFOL;  Surgeon: [name], MD;  Location: [place];  Service: Endoscopy;  Laterality: N/A;  Diabetic - oral meds  . EVALUATION UNDER ANESTHESIA WITH ANAL FISSUROTOMY N/A 03/27/2018   Procedure: EXAM UNDER ANESTHESIA, CHEMICAL SPHINCTEROTOMY;  Surgeon: Jules Husbands, MD;  Location: [facility];  Service: General;  Laterality: N/A;  . JOINT REPLACEMENT     b/l hips in [place] 2004 and 2005 Dr. Alfonso Ramus   . POLYPECTOMY  12/07/2016   Procedure: POLYPECTOMY;  Surgeon: [name], MD;  Location: [place];  Service: Endoscopy;;  . TOTAL HIP ARTHROPLASTY Bilateral   . TUBAL LIGATION     Family Hx:  Family History  Problem Relation Age of Onset  . Dementia Mother   . Hypertension Mother   . Arthritis Mother   . Alcohol abuse Father   . Cirrhosis Father   . Cancer Daughter        breast  . COPD Brother   . Breast cancer Sister 91  . Cancer Sister        ? type   . Cancer Brother        ?type  . Cancer Brother        cancer ?type  . Cancer Daughter        cancer ?type died 21    Social Hx:   Social History   Tobacco Use  . Smoking status: Never Smoker  . Smokeless tobacco: Never Used  Substance Use Topics  . Alcohol use: No    Alcohol/week: 0.0 standard drinks  . Drug use: No   Medication:    Current Outpatient Medications:  .  amLODipine (NORVASC) 10 MG tablet, TAKE 1 TABLET(10 MG) BY MOUTH DAILY (Patient taking differently: Take 10 mg by mouth daily. ), Disp: 90 tablet, Rfl: 0 .  atorvastatin (LIPITOR) 10 MG tablet, TAKE 1 TABLET(10 MG) BY MOUTH DAILY (Patient  taking differently: Take 10 mg by mouth at bedtime. ), Disp: 90 tablet, Rfl: 0 .  Calcium Carb-Cholecalciferol (CALCIUM 600+D) 600-800 MG-UNIT TABS, Take 1 tablet by mouth 2 (two) times daily., Disp: , Rfl:  .  carvedilol (COREG) 25 MG tablet, Take 25 mg by mouth 2 (two) times daily., Disp: , Rfl: 1 .  HYDROcodone-acetaminophen (NORCO/VICODIN) 5-325 MG tablet, Take 1 tablet by mouth every 6 (six) hours as needed for moderate pain., Disp: 20 tablet, Rfl: 0 .  losartan-hydrochlorothiazide (HYZAAR) 100-12.5 MG tablet, Take 1 tablet  by mouth daily., Disp: 90 tablet, Rfl: 3 .  metFORMIN (GLUCOPHAGE) 500 MG tablet, TAKE 1 TABLET BY MOUTH ONCE DAILY WITH BREAKFAST (Patient taking differently: Take 500 mg by mouth daily with breakfast. ), Disp: 30 tablet, Rfl: 1 .  Omega-3 Fatty Acids (FISH OIL) 1000 MG CAPS, Take 1,000 mg by mouth 2 (two) times daily. , Disp: , Rfl:  .  omeprazole (PRILOSEC) 20 MG capsule, TAKE 1 CAPSULE(20 MG) BY MOUTH DAILY (Patient taking differently: Take 20 mg by mouth daily. ), Disp: 90 capsule, Rfl: 0 .  UNABLE TO FIND, Med Name: 0.3% Nifedipine + 1.5% Lidocaine in [employer]- Place on Anal area TID., Disp: 30 g, Rfl: 1   Allergies:  Patient has no known allergies.  Review of Systems: Gen:  Denies  fever, sweats, chills HEENT: Denies blurred vision, double vision. bleeds, sore throat Cvc:  No dizziness, chest pain. Resp:   Denies cough or sputum production, shortness of breath Gi: Denies swallowing difficulty, stomach pain. Gu:  Denies bladder incontinence, burning urine Ext:   No Joint pain, stiffness. Skin: No skin rash,  hives  Endoc:  No polyuria, polydipsia. Psych: No depression, insomnia. Other:  All other systems were reviewed with the patient and were negative other that what is mentioned in the HPI.   Physical Examination:   VS: BP 124/70 (BP Location: Left Arm, Cuff Size: Large)   Pulse 78   Resp 16   Ht 5\' 4"  (1.626 m)   Wt 260 lb (117.9 kg)   SpO2  96%   BMI 44.63 kg/m   General Appearance: No distress  Neuro:without focal findings,  speech normal,  HEENT: PERRLA, EOM intact.   Pulmonary: normal breath sounds, No wheezing.  CardiovascularNormal S1,S2.  No m/r/g.   Abdomen: Benign, Soft, non-tender. Renal:  No costovertebral tenderness  GU:  No performed at this time. Endoc: No evident thyromegaly, no signs of acromegaly. Skin:   warm, no rashes, no ecchymosis  Extremities: normal, no cyanosis, clubbing.  Other findings:    LABORATORY PANEL:   CBC Recent Labs  Lab 04/04/18 1003  WBC 8.0  HGB 12.0  HCT 37.1  PLT 227.0   ------------------------------------------------------------------------------------------------------------------  Chemistries  Recent Labs  Lab 04/04/18 1003  NA 140  K 4.0  CL 101  CO2 34*  GLUCOSE 101*  BUN 16  CREATININE 0.79  CALCIUM 9.7  AST 16  ALT 8  ALKPHOS 58  BILITOT 0.8   ------------------------------------------------------------------------------------------------------------------  Cardiac Enzymes No results for input(s): TROPONINI in the last 168 hours. ------------------------------------------------------------  RADIOLOGY:  No results found.     Thank  you for the consultation and for allowing [facility] to assist in the care of your patient. Our recommendations are noted above.  Please contact us if we can be of further service.   Marda Stalker, M.D., F.C.C.P.  Board Certified in Internal Medicine, Pulmonary Medicine, [place], and Sleep Medicine.  [facility] Office Number: [phone]   04/09/2018

## 2018-04-09 NOTE — Patient Instructions (Addendum)
Continue using your CPAP every night for the whole night.   Weight loss will help with your breathing.   Will send for lung function test and chest x ray in 6 months, follow up after.

## 2018-04-17 ENCOUNTER — Encounter: Payer: Self-pay | Admitting: Surgery

## 2018-04-17 ENCOUNTER — Ambulatory Visit (INDEPENDENT_AMBULATORY_CARE_PROVIDER_SITE_OTHER): Payer: Medicare HMO | Admitting: Surgery

## 2018-04-17 ENCOUNTER — Other Ambulatory Visit: Payer: Self-pay

## 2018-04-17 VITALS — BP 142/64 | HR 80 | Temp 97.7°F | Resp 20 | Ht 64.0 in | Wt 261.0 lb

## 2018-04-17 DIAGNOSIS — Z09 Encounter for follow-up examination after completed treatment for conditions other than malignant neoplasm: Secondary | ICD-10-CM

## 2018-04-17 NOTE — Progress Notes (Signed)
S/p botox injection Doing well some pain NO fevers  PE NAD Rectal posterolateral grade II hemorrhoid. NO masses, still increased anal tone. No sepsis   A/P doing well Now w symptomatic hemorrhoid. Continue sitz baths, fiber and nifedipine May need to do hemorrhoidectomy at some point in time

## 2018-04-17 NOTE — Patient Instructions (Addendum)
The patient is aware to call back for any questions or new concerns. Resume activities as tolerated. Continue sitz bath and rectal cream and fiber supplements

## 2018-05-08 ENCOUNTER — Encounter: Payer: Self-pay | Admitting: Surgery

## 2018-05-08 ENCOUNTER — Other Ambulatory Visit: Payer: Self-pay

## 2018-05-08 ENCOUNTER — Ambulatory Visit (INDEPENDENT_AMBULATORY_CARE_PROVIDER_SITE_OTHER): Payer: Medicare HMO | Admitting: Surgery

## 2018-05-08 VITALS — BP 187/69 | HR 72 | Temp 97.7°F | Ht 65.0 in | Wt 261.0 lb

## 2018-05-08 DIAGNOSIS — K602 Anal fissure, unspecified: Secondary | ICD-10-CM | POA: Diagnosis not present

## 2018-05-08 DIAGNOSIS — Z09 Encounter for follow-up examination after completed treatment for conditions other than malignant neoplasm: Secondary | ICD-10-CM | POA: Diagnosis not present

## 2018-05-08 NOTE — Patient Instructions (Addendum)
Return in 4 weeks Cream called in today

## 2018-05-09 NOTE — Progress Notes (Signed)
Outpatient Surgical Follow Up  05/09/2018  Taylor Shaw is an 77 y.o. female.   Chief Complaint  Patient presents with  . Routine Post Op    HPI: doing better, still having some interm. Mild sharp pain. No fevers or chills.no hematochezia. No weight loss. Overall improving/  Past Medical History:  Diagnosis Date  . Arthritis   . Diabetes ([place]) 02/19/2015  . Diverticulosis 12/07/2016   Sigmoid Colon  . Diverticulosis   . Diverticulosis of sigmoid colon 12/08/2016  . Elevated blood sugar 01/15/2015  . GERD (gastroesophageal reflux disease)   . Hemorrhoids   . Hyperlipidemia   . Hypertension   . Hypertension 01/15/2015  . Obesity 01/15/2015  . OSA on CPAP   . Sleep apnea     Past Surgical History:  Procedure Laterality Date  . BOTOX INJECTION N/A 03/27/2018   Procedure: BOTOX INJECTION;  Surgeon: Jules Husbands, MD;  Location: [facility];  Service: General;  Laterality: N/A;  . COLONOSCOPY    . COLONOSCOPY WITH PROPOFOL N/A 12/07/2016   Procedure: COLONOSCOPY WITH PROPOFOL;  Surgeon: [name], MD;  Location: [place];  Service: Endoscopy;  Laterality: N/A;  Diabetic - oral meds  . EVALUATION UNDER ANESTHESIA WITH ANAL FISSUROTOMY N/A 03/27/2018   Procedure: EXAM UNDER ANESTHESIA, CHEMICAL SPHINCTEROTOMY;  Surgeon: Jules Husbands, MD;  Location: [facility];  Service: General;  Laterality: N/A;  . JOINT REPLACEMENT     b/l hips in [place] 2004 and 2005 Dr. Alfonso Ramus   . POLYPECTOMY  12/07/2016   Procedure: POLYPECTOMY;  Surgeon: [name], MD;  Location: [place];  Service: Endoscopy;;  . TOTAL HIP ARTHROPLASTY Bilateral   . TUBAL LIGATION      Family History  Problem Relation Age of Onset  . Dementia Mother   . Hypertension Mother   . Arthritis Mother   . Alcohol abuse Father   . Cirrhosis Father   . Cancer Daughter        breast  . COPD Brother   . Breast cancer Sister 4  . Cancer Sister        ? type   . Cancer Brother        ?type  .  Cancer Brother        cancer ?type  . Cancer Daughter        cancer ?type died 84     Social History:  reports that she has never smoked. She has never used smokeless tobacco. She reports that she does not drink alcohol or use drugs.  Allergies: No Known Allergies  Medications reviewed.    ROS Full ROS performed and is otherwise negative other than what is stated in HPI   BP (!) 187/69   Pulse 72   Temp 97.7 F (36.5 C) (Skin)   Ht 5\' 5"  (1.651 m)   Wt 261 lb (118.4 kg)   SpO2 98%   BMI 43.43 kg/m   Physical Exam NAD, alert Abd: soft,nt, no peritonitis Rectal: Right posterolateral hemorrhoid III. Healing anal fissure, no evidence of sepsis Ext: well perfused and warm.   Assessment/Plan:  Anal fissure, improving. Continue nifedipine andsitz baths RTC 3-4 week. She does have a hemorrhoid. If she continues top have issues we may have to do hemorrhoidectomy at some point in time.   Caroleen Hamman, MD [facility] General Surgeon

## 2018-06-10 ENCOUNTER — Encounter: Payer: Self-pay | Admitting: Surgery

## 2018-06-10 ENCOUNTER — Ambulatory Visit (INDEPENDENT_AMBULATORY_CARE_PROVIDER_SITE_OTHER): Payer: Medicare HMO | Admitting: Surgery

## 2018-06-10 ENCOUNTER — Other Ambulatory Visit: Payer: Self-pay

## 2018-06-10 ENCOUNTER — Telehealth: Payer: Self-pay | Admitting: *Deleted

## 2018-06-10 ENCOUNTER — Encounter: Payer: Self-pay | Admitting: *Deleted

## 2018-06-10 VITALS — BP 158/89 | HR 50 | Temp 97.5°F | Ht 65.0 in | Wt 256.6 lb

## 2018-06-10 DIAGNOSIS — K602 Anal fissure, unspecified: Secondary | ICD-10-CM | POA: Diagnosis not present

## 2018-06-10 NOTE — Progress Notes (Signed)
Outpatient Surgical Follow Up  06/10/2018  Taylor Shaw is an 78 y.o. female.   Chief Complaint  Patient presents with  . Routine Post Op    f/u s/p chemical sphincterotomy done 03-27-18    HPI: f/u after chemical sphincterotomy 10/23. She continue to have anorectal sharp pain worsening when having BM. Some mild constipation. Using fiber. Not using nifedipine cream. No hematochezia. No other issues.  Past Medical History:  Diagnosis Date  . Arthritis   . Diabetes ([place]) 02/19/2015  . Diverticulosis 12/07/2016   Sigmoid Colon  . Diverticulosis   . Diverticulosis of sigmoid colon 12/08/2016  . Elevated blood sugar 01/15/2015  . GERD (gastroesophageal reflux disease)   . Hemorrhoids   . Hyperlipidemia   . Hypertension   . Hypertension 01/15/2015  . Obesity 01/15/2015  . OSA on CPAP   . Sleep apnea     Past Surgical History:  Procedure Laterality Date  . BOTOX INJECTION N/A 03/27/2018   Procedure: BOTOX INJECTION;  Surgeon: Jules Husbands, MD;  Location: [facility];  Service: General;  Laterality: N/A;  . COLONOSCOPY    . COLONOSCOPY WITH PROPOFOL N/A 12/07/2016   Procedure: COLONOSCOPY WITH PROPOFOL;  Surgeon: [name], MD;  Location: [place];  Service: Endoscopy;  Laterality: N/A;  Diabetic - oral meds  . EVALUATION UNDER ANESTHESIA WITH ANAL FISSUROTOMY N/A 03/27/2018   Procedure: EXAM UNDER ANESTHESIA, CHEMICAL SPHINCTEROTOMY;  Surgeon: Jules Husbands, MD;  Location: [facility];  Service: General;  Laterality: N/A;  . JOINT REPLACEMENT     b/l hips in [place] 2004 and 2005 Dr. Alfonso Ramus   . POLYPECTOMY  12/07/2016   Procedure: POLYPECTOMY;  Surgeon: [name], MD;  Location: [place];  Service: Endoscopy;;  . TOTAL HIP ARTHROPLASTY Bilateral   . TUBAL LIGATION      Family History  Problem Relation Age of Onset  . Dementia Mother   . Hypertension Mother   . Arthritis Mother   . Alcohol abuse Father   . Cirrhosis Father   . Cancer Daughter        breast  . COPD Brother   . Breast cancer Sister 68  . Cancer Sister        ? type   . Cancer Brother        ?type  . Cancer Brother        cancer ?type  . Cancer Daughter        cancer ?type died 37     Social History:  reports that she has never smoked. She has never used smokeless tobacco. She reports that she does not drink alcohol or use drugs.  Allergies: No Known Allergies  Medications reviewed.    ROS Full ROS performed and is otherwise negative other than what is stated in HPI   BP (!) 158/89   Pulse (!) 50   Temp (!) 97.5 F (36.4 C) (Temporal)   Ht 5\' 5"  (1.651 m)   Wt 256 lb 9.6 oz (116.4 kg)   SpO2 93%   BMI 42.70 kg/m   Physical Exam Vitals signs and nursing note reviewed. Exam conducted with a chaperone present.  Constitutional:      Appearance: Normal appearance. She is obese.  Eyes:     General:        Right eye: No discharge.        Left eye: No discharge.     Extraocular Movements: Extraocular movements intact.     Conjunctiva/sclera:  Conjunctivae normal.  Neck:     Musculoskeletal: Normal range of motion and neck supple.  Cardiovascular:     Rate and Rhythm: Normal rate.     Pulses: Normal pulses.     Heart sounds: Normal heart sounds.  Pulmonary:     Effort: Pulmonary effort is normal. No respiratory distress.     Breath sounds: Normal breath sounds.  Chest:     Chest wall: No tenderness.  Abdominal:     General: There is no distension.     Palpations: Abdomen is soft. There is no mass.     Tenderness: There is no guarding or rebound.  Genitourinary:    Comments: THere is a Right Lateral grade IV hemorrhoid tender to palpation. No incarceration or perineal sepsis Skin:    General: Skin is warm and dry.     Capillary Refill: Capillary refill takes less than 2 seconds.  Neurological:     General: No focal deficit present.     Mental Status: She is alert and oriented to person, place, and time.  Psychiatric:        Mood and  Affect: Mood normal.        Behavior: Behavior normal.        Thought Content: Thought content normal.        Judgment: Judgment normal.       Assessment/Plan:  1. Anal fissure much better. Now hemorrhoid is the main issue. Given the fact that this has not improved over the last few months. I do recommend Excision under Anesthesia. Procedure d/w the pt in detail risks, benefits and possible complications including but not limited to pain, recurrence, stenosis were d/w the pt in detail . She understands and wishes to proceed.     Greater than 50% of the 20 minutes  visit was spent in counseling/coordination of care   Caroleen Hamman, MD [place] Surgeon

## 2018-06-10 NOTE — Progress Notes (Signed)
Patient's surgery to be scheduled for 06-13-18 at [facility] with Dr. Dahlia Byes.  The patient recently had surgery on 03-27-18 with Dr. Dahlia Byes. She is aware we will try and have the Pre-Admission Testing Department contact her to complete a phone interview sometime in the near future. Patient aware that if this is not required she will follow same instructions given for previous surgery. Also, patient aware if the Pre-admission Testing Department wants her to come in for an in office visit that she will be contacted and made aware.   The patient is aware to call the office should she have further questions.

## 2018-06-10 NOTE — Telephone Encounter (Signed)
Patient's daughter, Taylor Shaw, notified that patient will need lab work prior to surgery.   This has been scheduled for 06-12-18 at 11:15 am. She is aware to have patient check in at the [place] first floor [address].  Surgery with Dr. Dahlia Byes for 06-13-18.

## 2018-06-10 NOTE — Patient Instructions (Signed)
Patient is to return to be scheduled for surgery with Dr.Pabon.  Call the office with any questions or concerns.

## 2018-06-10 NOTE — H&P (View-Only) (Signed)
Outpatient Surgical Follow Up  06/10/2018  Taylor Shaw is an 78 y.o. female.   Chief Complaint  Patient presents with  . Routine Post Op    f/u s/p chemical sphincterotomy done 03-27-18    HPI: f/u after chemical sphincterotomy 10/23. She continue to have anorectal sharp pain worsening when having BM. Some mild constipation. Using fiber. Not using nifedipine cream. No hematochezia. No other issues.  Past Medical History:  Diagnosis Date  . Arthritis   . Diabetes ([place]) 02/19/2015  . Diverticulosis 12/07/2016   Sigmoid Colon  . Diverticulosis   . Diverticulosis of sigmoid colon 12/08/2016  . Elevated blood sugar 01/15/2015  . GERD (gastroesophageal reflux disease)   . Hemorrhoids   . Hyperlipidemia   . Hypertension   . Hypertension 01/15/2015  . Obesity 01/15/2015  . OSA on CPAP   . Sleep apnea     Past Surgical History:  Procedure Laterality Date  . BOTOX INJECTION N/A 03/27/2018   Procedure: BOTOX INJECTION;  Surgeon: Jules Husbands, MD;  Location: [facility];  Service: General;  Laterality: N/A;  . COLONOSCOPY    . COLONOSCOPY WITH PROPOFOL N/A 12/07/2016   Procedure: COLONOSCOPY WITH PROPOFOL;  Surgeon: [name], MD;  Location: [facility];  Service: Endoscopy;  Laterality: N/A;  Diabetic - oral meds  . EVALUATION UNDER ANESTHESIA WITH ANAL FISSUROTOMY N/A 03/27/2018   Procedure: EXAM UNDER ANESTHESIA, CHEMICAL SPHINCTEROTOMY;  Surgeon: Jules Husbands, MD;  Location: [facility];  Service: General;  Laterality: N/A;  . JOINT REPLACEMENT     b/l hips in [place] 2004 and 2005 Dr. Alfonso Ramus   . POLYPECTOMY  12/07/2016   Procedure: POLYPECTOMY;  Surgeon: [name], MD;  Location: [facility];  Service: Endoscopy;;  . TOTAL HIP ARTHROPLASTY Bilateral   . TUBAL LIGATION      Family History  Problem Relation Age of Onset  . Dementia Mother   . Hypertension Mother   . Arthritis Mother   . Alcohol abuse Father   . Cirrhosis Father   . Cancer Daughter        breast  . COPD Brother   . Breast cancer Sister 70  . Cancer Sister        ? type   . Cancer Brother        ?type  . Cancer Brother        cancer ?type  . Cancer Daughter        cancer ?type died 5     Social History:  reports that she has never smoked. She has never used smokeless tobacco. She reports that she does not drink alcohol or use drugs.  Allergies: No Known Allergies  Medications reviewed.    ROS Full ROS performed and is otherwise negative other than what is stated in HPI   BP (!) 158/89   Pulse (!) 50   Temp (!) 97.5 F (36.4 C) (Temporal)   Ht 5\' 5"  (1.651 m)   Wt 256 lb 9.6 oz (116.4 kg)   SpO2 93%   BMI 42.70 kg/m   Physical Exam Vitals signs and nursing note reviewed. Exam conducted with a chaperone present.  Constitutional:      Appearance: Normal appearance. She is obese.  Eyes:     General:        Right eye: No discharge.        Left eye: No discharge.     Extraocular Movements: Extraocular movements intact.     Conjunctiva/sclera:  Conjunctivae normal.  Neck:     Musculoskeletal: Normal range of motion and neck supple.  Cardiovascular:     Rate and Rhythm: Normal rate.     Pulses: Normal pulses.     Heart sounds: Normal heart sounds.  Pulmonary:     Effort: Pulmonary effort is normal. No respiratory distress.     Breath sounds: Normal breath sounds.  Chest:     Chest wall: No tenderness.  Abdominal:     General: There is no distension.     Palpations: Abdomen is soft. There is no mass.     Tenderness: There is no guarding or rebound.  Genitourinary:    Comments: THere is a Right Lateral grade IV hemorrhoid tender to palpation. No incarceration or perineal sepsis Skin:    General: Skin is warm and dry.     Capillary Refill: Capillary refill takes less than 2 seconds.  Neurological:     General: No focal deficit present.     Mental Status: She is alert and oriented to person, place, and time.  Psychiatric:        Mood and  Affect: Mood normal.        Behavior: Behavior normal.        Thought Content: Thought content normal.        Judgment: Judgment normal.       Assessment/Plan:  1. Anal fissure much better. Now hemorrhoid is the main issue. Given the fact that this has not improved over the last few months. I do recommend Excision under Anesthesia. Procedure d/w the pt in detail risks, benefits and possible complications including but not limited to pain, recurrence, stenosis were d/w the pt in detail . She understands and wishes to proceed.     Greater than 50% of the 20 minutes  visit was spent in counseling/coordination of care   Caroleen Hamman, MD [place] Surgeon

## 2018-06-12 ENCOUNTER — Encounter
Admission: RE | Admit: 2018-06-12 | Discharge: 2018-06-12 | Disposition: A | Discharge status: Home / Self Care | Payer: Medicare HMO | Source: Ambulatory Visit | Attending: Surgery | Admitting: Surgery

## 2018-06-12 ENCOUNTER — Other Ambulatory Visit: Payer: Self-pay

## 2018-06-12 DIAGNOSIS — Z86010 Personal history of colonic polyps: Secondary | ICD-10-CM | POA: Diagnosis not present

## 2018-06-12 DIAGNOSIS — Z8249 Family history of ischemic heart disease and other diseases of the circulatory system: Secondary | ICD-10-CM | POA: Diagnosis not present

## 2018-06-12 DIAGNOSIS — E669 Obesity, unspecified: Secondary | ICD-10-CM | POA: Diagnosis not present

## 2018-06-12 DIAGNOSIS — Z803 Family history of malignant neoplasm of breast: Secondary | ICD-10-CM | POA: Diagnosis not present

## 2018-06-12 DIAGNOSIS — Z01818 Encounter for other preprocedural examination: Secondary | ICD-10-CM | POA: Insufficient documentation

## 2018-06-12 DIAGNOSIS — M1990 Unspecified osteoarthritis, unspecified site: Secondary | ICD-10-CM | POA: Diagnosis not present

## 2018-06-12 DIAGNOSIS — K642 Third degree hemorrhoids: Secondary | ICD-10-CM | POA: Diagnosis not present

## 2018-06-12 DIAGNOSIS — Z8261 Family history of arthritis: Secondary | ICD-10-CM | POA: Diagnosis not present

## 2018-06-12 DIAGNOSIS — Z6841 Body Mass Index (BMI) 40.0 and over, adult: Secondary | ICD-10-CM | POA: Diagnosis not present

## 2018-06-12 DIAGNOSIS — E785 Hyperlipidemia, unspecified: Secondary | ICD-10-CM | POA: Diagnosis not present

## 2018-06-12 DIAGNOSIS — I10 Essential (primary) hypertension: Secondary | ICD-10-CM | POA: Diagnosis not present

## 2018-06-12 DIAGNOSIS — K219 Gastro-esophageal reflux disease without esophagitis: Secondary | ICD-10-CM | POA: Diagnosis not present

## 2018-06-12 DIAGNOSIS — K5900 Constipation, unspecified: Secondary | ICD-10-CM | POA: Diagnosis not present

## 2018-06-12 DIAGNOSIS — E1165 Type 2 diabetes mellitus with hyperglycemia: Secondary | ICD-10-CM | POA: Diagnosis not present

## 2018-06-12 DIAGNOSIS — Z8379 Family history of other diseases of the digestive system: Secondary | ICD-10-CM | POA: Diagnosis not present

## 2018-06-12 DIAGNOSIS — K5730 Diverticulosis of large intestine without perforation or abscess without bleeding: Secondary | ICD-10-CM | POA: Diagnosis not present

## 2018-06-12 DIAGNOSIS — Z811 Family history of alcohol abuse and dependence: Secondary | ICD-10-CM | POA: Diagnosis not present

## 2018-06-12 DIAGNOSIS — Z825 Family history of asthma and other chronic lower respiratory diseases: Secondary | ICD-10-CM | POA: Diagnosis not present

## 2018-06-12 DIAGNOSIS — G4733 Obstructive sleep apnea (adult) (pediatric): Secondary | ICD-10-CM | POA: Diagnosis not present

## 2018-06-12 DIAGNOSIS — Z96643 Presence of artificial hip joint, bilateral: Secondary | ICD-10-CM | POA: Diagnosis not present

## 2018-06-12 DIAGNOSIS — K602 Anal fissure, unspecified: Secondary | ICD-10-CM | POA: Diagnosis not present

## 2018-06-12 DIAGNOSIS — Z809 Family history of malignant neoplasm, unspecified: Secondary | ICD-10-CM | POA: Diagnosis not present

## 2018-06-12 HISTORY — DX: Personal history of other medical treatment: Z92.89

## 2018-06-12 NOTE — Patient Instructions (Signed)
Your procedure is scheduled on: June 13, 2018  Report to [place]     DO NOT STOP ON THE FIRST FLOOR TO REGISTER   Remember: Instructions that are not followed completely may result in serious medical risk,  up to and including death, or upon the discretion of your surgeon and anesthesiologist your  surgery may need to be rescheduled.     _X__ 1. Do not eat food after midnight the night before your procedure.                 No gum chewing or hard candies.                     ABSOLUTELY NOTHING SOLID IN YOUR MOUTH AFTER MIDNIGHT                  You may drink clear liquids up to 2 hours before you are scheduled to arrive for your surgery-                  DO not drink clear liquids within 2 hours of the start of your surgery.                 Clear Liquids include:  water, apple juice without pulp, clear carbohydrate                 drink such as Clearfast of Gatorade, Black Coffee or Tea (Do not add                 anything to coffee or tea).  __X__2.  On the morning of surgery brush your teeth with toothpaste and water,                 You may rinse your mouth with mouthwash if you wish.                     Do not swallow any toothpaste of mouthwash.     _X__ 3.  No Alcohol for 24 hours before or after surgery.   _X__ 4.  Do Not Smoke or use e-cigarettes For 24 Hours Prior to Your Surgery.                 Do not use any chewable tobacco products for at least 6 hours prior to                 surgery.  ____  5.  Bring all medications with you on the day of surgery if instructed.   ____  6.  Notify your doctor if there is any change in your medical condition      (cold, fever, infections).     Do not wear jewelry, make-up, hairpins, clips or nail polish. Do not wear lotions, powders, or perfumes. You may wear deodorant. Do not shave 48 hours prior to surgery. Men may shave face and neck. Do not bring valuables to the hospital.     [facility] is not responsible for any belongings or valuables.  Contacts, dentures or bridgework may not be worn into surgery. Leave your suitcase in the car. After surgery it may be brought to your room. For patients admitted to the hospital, discharge time is determined by your treatment team.   Patients discharged the day of surgery will not be allowed to drive home.   Please read over the following fact sheets that you were given:  PREPARING FOR SURGERY  ____ Take these medicines the morning of surgery with A SIP OF WATER:    1.AMLODIPINE  2. CARVEDILOL/COREG  3. OMEPRAZOLE  4.  5.  6.  __X__ Fleet Enema (as directed)               USE AN ENEMA TONIGHT AND AGAIN IN THE MORNING  _X__ Use CHG Soap as directed  ____ Use inhalers on the day of surgery  __X__ Stop metformin 2 days prior to surgery. YOU ALREADY STOPPED    ____ Stop Coumadin/Plavix/aspirin  _X___ Stop Anti-inflammatories AS OF NOW   __X__ Stop supplements until after surgery.                DO NOT TAKE CALCIUM / FIBER / FISH OIL IN THE MORNING OF SURGERY  _X___ Bring C-Pap to the hospital.              [place]  DO NOT TAKE LOSARTAN/HYDROCHLOROTHIAZIDE IN THE MORNING  DO NOT USE THE RECTAL CREAM IN THE MORNING OF SURGERY  DO TAKE LIPITOR TONIGHT

## 2018-06-13 ENCOUNTER — Ambulatory Visit
Admission: RE | Admit: 2018-06-13 | Discharge: 2018-06-13 | Disposition: A | Discharge status: Home / Self Care | Payer: Medicare HMO | Attending: Surgery | Admitting: Surgery

## 2018-06-13 ENCOUNTER — Ambulatory Visit: Payer: Medicare HMO | Admitting: Anesthesiology

## 2018-06-13 ENCOUNTER — Other Ambulatory Visit: Payer: Self-pay

## 2018-06-13 ENCOUNTER — Encounter
Admission: RE | Disposition: A | Discharge status: Home / Self Care | Payer: Self-pay | Source: Home / Self Care | Attending: Surgery

## 2018-06-13 DIAGNOSIS — E669 Obesity, unspecified: Secondary | ICD-10-CM | POA: Insufficient documentation

## 2018-06-13 DIAGNOSIS — Z825 Family history of asthma and other chronic lower respiratory diseases: Secondary | ICD-10-CM | POA: Insufficient documentation

## 2018-06-13 DIAGNOSIS — K219 Gastro-esophageal reflux disease without esophagitis: Secondary | ICD-10-CM | POA: Insufficient documentation

## 2018-06-13 DIAGNOSIS — Z8249 Family history of ischemic heart disease and other diseases of the circulatory system: Secondary | ICD-10-CM | POA: Insufficient documentation

## 2018-06-13 DIAGNOSIS — E785 Hyperlipidemia, unspecified: Secondary | ICD-10-CM | POA: Insufficient documentation

## 2018-06-13 DIAGNOSIS — G4733 Obstructive sleep apnea (adult) (pediatric): Secondary | ICD-10-CM | POA: Insufficient documentation

## 2018-06-13 DIAGNOSIS — Z809 Family history of malignant neoplasm, unspecified: Secondary | ICD-10-CM | POA: Insufficient documentation

## 2018-06-13 DIAGNOSIS — M1990 Unspecified osteoarthritis, unspecified site: Secondary | ICD-10-CM | POA: Insufficient documentation

## 2018-06-13 DIAGNOSIS — Z811 Family history of alcohol abuse and dependence: Secondary | ICD-10-CM | POA: Insufficient documentation

## 2018-06-13 DIAGNOSIS — K5900 Constipation, unspecified: Secondary | ICD-10-CM | POA: Insufficient documentation

## 2018-06-13 DIAGNOSIS — K642 Third degree hemorrhoids: Secondary | ICD-10-CM | POA: Diagnosis not present

## 2018-06-13 DIAGNOSIS — Z96643 Presence of artificial hip joint, bilateral: Secondary | ICD-10-CM | POA: Insufficient documentation

## 2018-06-13 DIAGNOSIS — E1165 Type 2 diabetes mellitus with hyperglycemia: Secondary | ICD-10-CM | POA: Insufficient documentation

## 2018-06-13 DIAGNOSIS — I10 Essential (primary) hypertension: Secondary | ICD-10-CM | POA: Insufficient documentation

## 2018-06-13 DIAGNOSIS — Z8261 Family history of arthritis: Secondary | ICD-10-CM | POA: Insufficient documentation

## 2018-06-13 DIAGNOSIS — K5730 Diverticulosis of large intestine without perforation or abscess without bleeding: Secondary | ICD-10-CM | POA: Insufficient documentation

## 2018-06-13 DIAGNOSIS — Z86010 Personal history of colonic polyps: Secondary | ICD-10-CM | POA: Insufficient documentation

## 2018-06-13 DIAGNOSIS — Z8379 Family history of other diseases of the digestive system: Secondary | ICD-10-CM | POA: Insufficient documentation

## 2018-06-13 DIAGNOSIS — K602 Anal fissure, unspecified: Secondary | ICD-10-CM | POA: Insufficient documentation

## 2018-06-13 DIAGNOSIS — Z803 Family history of malignant neoplasm of breast: Secondary | ICD-10-CM | POA: Insufficient documentation

## 2018-06-13 DIAGNOSIS — Z6841 Body Mass Index (BMI) 40.0 and over, adult: Secondary | ICD-10-CM | POA: Insufficient documentation

## 2018-06-13 HISTORY — PX: HEMORRHOID SURGERY: SHX153

## 2018-06-13 HISTORY — PX: RECTAL EXAM UNDER ANESTHESIA: SHX6399

## 2018-06-13 LAB — GLUCOSE, CAPILLARY
GLUCOSE-CAPILLARY: 81 mg/dL (ref 70–99)
Glucose-Capillary: 104 mg/dL — ABNORMAL HIGH (ref 70–99)

## 2018-06-13 SURGERY — HEMORRHOIDECTOMY
Anesthesia: General

## 2018-06-13 MED ORDER — ONDANSETRON HCL 4 MG/2ML IJ SOLN: 4.0000 mg | Freq: Once | INTRAMUSCULAR | Status: DC | PRN

## 2018-06-13 MED ORDER — ACETAMINOPHEN 500 MG PO TABS
1000.0000 mg | ORAL_TABLET | ORAL | Status: AC
  Administered 2018-06-13: 1000 mg via ORAL

## 2018-06-13 MED ORDER — GABAPENTIN 300 MG PO CAPS
300.0000 mg | ORAL_CAPSULE | ORAL | Status: AC
  Administered 2018-06-13: 300 mg via ORAL

## 2018-06-13 MED ORDER — CHLORHEXIDINE GLUCONATE CLOTH 2 % EX PADS: 6.0000 | MEDICATED_PAD | Freq: Once | CUTANEOUS | Status: DC

## 2018-06-13 MED ORDER — FENTANYL CITRATE (PF) 100 MCG/2ML IJ SOLN
INTRAMUSCULAR | Status: DC | PRN
  Administered 2018-06-13 (×2): 25 ug via INTRAVENOUS

## 2018-06-13 MED ORDER — BUPIVACAINE LIPOSOME 1.3 % IJ SUSP
INTRAMUSCULAR | Status: DC | PRN
  Administered 2018-06-13: 20 mL

## 2018-06-13 MED ORDER — GELATIN ABSORBABLE 12-7 MM EX MISC
CUTANEOUS | Status: DC | PRN
  Administered 2018-06-13: 1

## 2018-06-13 MED ORDER — HYDROCODONE-ACETAMINOPHEN 5-325 MG PO TABS: 1.0000 | ORAL_TABLET | Freq: Four times a day (QID) | ORAL | 0 refills | Status: DC | PRN

## 2018-06-13 MED ORDER — CELECOXIB 200 MG PO CAPS
200.0000 mg | ORAL_CAPSULE | ORAL | Status: AC
  Administered 2018-06-13: 200 mg via ORAL

## 2018-06-13 MED ORDER — BUPIVACAINE LIPOSOME 1.3 % IJ SUSP: 20.0000 mL | Freq: Once | INTRAMUSCULAR | Status: DC

## 2018-06-13 MED ORDER — ACETAMINOPHEN 500 MG PO TABS
ORAL_TABLET | ORAL | Status: AC
  Administered 2018-06-13: 1000 mg via ORAL
  Filled 2018-06-13: qty 2

## 2018-06-13 MED ORDER — EVICEL 5 ML EX KIT
PACK | CUTANEOUS | Status: DC | PRN
  Administered 2018-06-13: 5 mL

## 2018-06-13 MED ORDER — PROPOFOL 10 MG/ML IV BOLUS
INTRAVENOUS | Status: AC
  Filled 2018-06-13: qty 20

## 2018-06-13 MED ORDER — PROPOFOL 10 MG/ML IV BOLUS
INTRAVENOUS | Status: DC | PRN
  Administered 2018-06-13: 50 mg via INTRAVENOUS

## 2018-06-13 MED ORDER — SODIUM CHLORIDE 0.9 % IV SOLN
INTRAVENOUS | Status: DC
  Administered 2018-06-13: 11:00:00 via INTRAVENOUS

## 2018-06-13 MED ORDER — BUPIVACAINE LIPOSOME 1.3 % IJ SUSP
INTRAMUSCULAR | Status: AC
  Filled 2018-06-13: qty 20

## 2018-06-13 MED ORDER — GABAPENTIN 300 MG PO CAPS
ORAL_CAPSULE | ORAL | Status: AC
  Administered 2018-06-13: 300 mg via ORAL
  Filled 2018-06-13: qty 1

## 2018-06-13 MED ORDER — FENTANYL CITRATE (PF) 100 MCG/2ML IJ SOLN: 25.0000 ug | INTRAMUSCULAR | Status: DC | PRN

## 2018-06-13 MED ORDER — GELATIN ABSORBABLE 100 CM EX MISC
CUTANEOUS | Status: AC
  Filled 2018-06-13: qty 1

## 2018-06-13 MED ORDER — CELECOXIB 200 MG PO CAPS
ORAL_CAPSULE | ORAL | Status: AC
  Administered 2018-06-13: 200 mg via ORAL
  Filled 2018-06-13: qty 1

## 2018-06-13 MED ORDER — PROPOFOL 500 MG/50ML IV EMUL
INTRAVENOUS | Status: DC | PRN
  Administered 2018-06-13: 75 ug/kg/min via INTRAVENOUS

## 2018-06-13 MED ORDER — FENTANYL CITRATE (PF) 100 MCG/2ML IJ SOLN
INTRAMUSCULAR | Status: AC
  Filled 2018-06-13: qty 2

## 2018-06-13 MED ORDER — EVICEL 5 ML EX KIT
PACK | CUTANEOUS | Status: AC
  Filled 2018-06-13: qty 1

## 2018-06-13 SURGICAL SUPPLY — 17 items
BLADE CLIPPER SURG (BLADE) ×3 IMPLANT
CANISTER SUCT 1200ML W/VALVE (MISCELLANEOUS) ×3 IMPLANT
COVER WAND RF STERILE (DRAPES) ×3 IMPLANT
DRAPE LAPAROTOMY 100X77 ABD (DRAPES) IMPLANT
DRAPE LEGGINS SURG 28X43 STRL (DRAPES) IMPLANT
DRAPE UNDER BUTTOCK W/FLU (DRAPES) IMPLANT
ELECT REM PT RETURN 9FT ADLT (ELECTROSURGICAL) ×3
ELECTRODE REM PT RTRN 9FT ADLT (ELECTROSURGICAL) ×1 IMPLANT
GLOVE BIO SURGEON STRL SZ7 (GLOVE) ×3 IMPLANT
GOWN STRL REUS W/ TWL LRG LVL3 (GOWN DISPOSABLE) ×2 IMPLANT
GOWN STRL REUS W/TWL LRG LVL3 (GOWN DISPOSABLE) ×4
HARMONIC SCALPEL FOCUS (MISCELLANEOUS) ×2 IMPLANT
PACK BASIN MINOR ARMC (MISCELLANEOUS) ×3 IMPLANT
PAD PREP 24X41 OB/GYN DISP (PERSONAL CARE ITEMS) ×3 IMPLANT
SOL PREP PVP 2OZ (MISCELLANEOUS) ×3
SOLUTION PREP PVP 2OZ (MISCELLANEOUS) ×1 IMPLANT
SPONGE LAP 18X18 RF (DISPOSABLE) ×3 IMPLANT

## 2018-06-13 NOTE — Discharge Instructions (Addendum)

## 2018-06-13 NOTE — OR Nursing (Signed)
Dr. Dahlia Byes placed a piece of gelfoam with 5 mls of Evicel on it and it was placed into her rectum.

## 2018-06-13 NOTE — Anesthesia Preprocedure Evaluation (Signed)
Anesthesia Evaluation  Patient identified by MRN, date of birth, ID band Patient awake    Reviewed: Allergy & Precautions, NPO status , Patient's Chart, lab work & pertinent test results  History of Anesthesia Complications Negative for: history of anesthetic complications  Airway Mallampati: III  TM Distance: >3 FB Neck ROM: Full    Dental no notable dental hx.    Pulmonary sleep apnea and Continuous Positive Airway Pressure Ventilation , neg COPD,    breath sounds clear to auscultation- rhonchi (-) wheezing      Cardiovascular hypertension, Pt. on medications (-) CAD, (-) Past MI, (-) Cardiac Stents and (-) CABG  Rhythm:Regular Rate:Normal - Systolic murmurs and - Diastolic murmurs    Neuro/Psych neg Seizures negative neurological ROS  negative psych ROS   GI/Hepatic Neg liver ROS, GERD  ,  Endo/Other  diabetes, Oral Hypoglycemic Agents  Renal/GU negative Renal ROS     Musculoskeletal  (+) Arthritis ,   Abdominal (+) + obese,   Peds  Hematology negative hematology ROS (+)   Anesthesia Other Findings Past Medical History: No date: Arthritis 02/19/2015: Diabetes ([place]) 12/07/2016: Diverticulosis     Comment:  Sigmoid Colon No date: Diverticulosis 12/08/2016: Diverticulosis of sigmoid colon 01/15/2015: Elevated blood sugar No date: GERD (gastroesophageal reflux disease) No date: Hemorrhoids No date: History of blood transfusion No date: Hyperlipidemia No date: Hypertension 01/15/2015: Hypertension 01/15/2015: Obesity No date: OSA on CPAP No date: Sleep apnea   Reproductive/Obstetrics                             Anesthesia Physical Anesthesia Plan  ASA: III  Anesthesia Plan: General   Post-op Pain Management:    Induction: Intravenous  PONV Risk Score and Plan: 2 and Propofol infusion  Airway Management Planned: Natural Airway  Additional Equipment:   Intra-op Plan:    Post-operative Plan:   Informed Consent: I have reviewed the patients History and Physical, chart, labs and discussed the procedure including the risks, benefits and alternatives for the proposed anesthesia with the patient or authorized representative who has indicated his/her understanding and acceptance.   Dental advisory given  Plan Discussed with: CRNA and Anesthesiologist  Anesthesia Plan Comments:         Anesthesia Quick Evaluation

## 2018-06-13 NOTE — Interval H&P Note (Signed)
History and Physical Interval Note:  06/13/2018 12:12 PM  Taylor Shaw  has presented today for surgery, with the diagnosis of HEMORRHOIDS  The various methods of treatment have been discussed with the patient and family. After consideration of risks, benefits and other options for treatment, the patient has consented to  Procedure(s): HEMORRHOIDECTOMY (N/A) RECTAL EXAM UNDER ANESTHESIA (N/A) as a surgical intervention .  The patient's history has been reviewed, patient examined, no change in status, stable for surgery.  I have reviewed the patient's chart and labs.  Questions were answered to the patient's satisfaction.     [place]

## 2018-06-13 NOTE — Op Note (Signed)
  06/13/2018  1:59 PM  PATIENT:  Taylor Shaw  78 y.o. female  PRE-OPERATIVE DIAGNOSIS: Symptomatic grade III Internal hemorrhoids  POST-OPERATIVE DIAGNOSIS:  Same  PROCEDURE:   Internal hemorrhoidectomy x 2. Left and right posterolateral columns   SURGEON:  Surgeon(s) and Role:    * Pabon, Diego F, MD - Primary  Findings: left and right grade III hemorrhoids. Healed previous anal fissure  ANESTHESIA: general    DICTATION:  Patient was explained about the procedure in detail, risks, benefits and possible complications and a consent was obtained. The patient taken to the operating room and placed in the lithotomy position.  EUA revealed two internal hemorrhoids Hemorrhoidectomy was performed with harmonic scalpel after elevating the cushion. Exact procedure was performed on the contralateral side. Good hemostasis achieved Liposomal Marcaine was injected around the wound site. Gelfoam was placed in the rectal canal. Needle and laparotomy counts were correct and there were no immediate complications  Jules Husbands, MD

## 2018-06-14 ENCOUNTER — Encounter: Payer: Self-pay | Admitting: Surgery

## 2018-06-14 LAB — SURGICAL PATHOLOGY

## 2018-06-14 NOTE — Transfer of Care (Signed)
Immediate Anesthesia Transfer of Care Note  Patient: Taylor Shaw  Procedure(s) Performed: HEMORRHOIDECTOMY (N/A ) RECTAL EXAM UNDER ANESTHESIA (N/A )  Patient Location: PACU  Anesthesia Type:General  Level of Consciousness: awake, alert  and oriented  Airway & Oxygen Therapy: Patient Spontanous Breathing and Patient connected to face mask oxygen  Post-op Assessment: Report given to RN and Post -op Vital signs reviewed and stable  Post vital signs: Reviewed and stable  Last Vitals:  Vitals Value Taken Time  BP    Temp    Pulse    Resp    SpO2      Last Pain:  Vitals:   06/13/18 1447  TempSrc: Temporal  PainSc: 0-No pain         Complications: No apparent anesthesia complications

## 2018-06-14 NOTE — Anesthesia Postprocedure Evaluation (Signed)
Anesthesia Post Note  Patient: Odessia Asleson Peters  Procedure(s) Performed: HEMORRHOIDECTOMY (N/A ) RECTAL EXAM UNDER ANESTHESIA (N/A )  Patient location during evaluation: PACU Anesthesia Type: General Level of consciousness: awake and alert and oriented Pain management: pain level controlled Vital Signs Assessment: post-procedure vital signs reviewed and stable Respiratory status: spontaneous breathing, nonlabored ventilation and respiratory function stable Cardiovascular status: blood pressure returned to baseline and stable Postop Assessment: no signs of nausea or vomiting Anesthetic complications: no     Last Vitals:  Vitals:   06/13/18 1447 06/13/18 1514  BP: (!) 142/82 134/60  Pulse: (!) 50 61  Resp: 16 16  Temp: (!) 36.1 C   SpO2: 100% 96%    Last Pain:  Vitals:   06/13/18 1447  TempSrc: Temporal  PainSc: 0-No pain                 Bodey Frizell

## 2018-06-14 NOTE — Anesthesia Post-op Follow-up Note (Signed)
Anesthesia QCDR form completed.        

## 2018-06-26 ENCOUNTER — Encounter: Payer: Self-pay | Admitting: Surgery

## 2018-06-26 ENCOUNTER — Other Ambulatory Visit: Payer: Self-pay

## 2018-06-26 ENCOUNTER — Ambulatory Visit (INDEPENDENT_AMBULATORY_CARE_PROVIDER_SITE_OTHER): Payer: Medicare HMO | Admitting: Surgery

## 2018-06-26 VITALS — BP 157/108 | HR 71 | Temp 97.7°F | Ht 64.75 in | Wt 254.8 lb

## 2018-06-26 DIAGNOSIS — Z09 Encounter for follow-up examination after completed treatment for conditions other than malignant neoplasm: Secondary | ICD-10-CM

## 2018-06-26 NOTE — Progress Notes (Signed)
S/p hemorrhoidectomy Having some pain as expected Some minor blood when having BM as expected No fevers  PE NAD Rectal hemorrhoidectomy site healing well, no infection or active bleeding  A/p Doing well Continue sitz baths RTC 3 weeks

## 2018-06-26 NOTE — Patient Instructions (Addendum)
Patient is to return to the office in 3 weeks with Dr.Pabon. You can take tylenol or ibuprofen for pain as needed.   Call the office with any questions or concerns.

## 2018-06-28 ENCOUNTER — Encounter: Payer: Self-pay | Admitting: Surgery

## 2018-07-04 ENCOUNTER — Ambulatory Visit (INDEPENDENT_AMBULATORY_CARE_PROVIDER_SITE_OTHER): Payer: Medicare HMO | Admitting: Internal Medicine

## 2018-07-04 ENCOUNTER — Encounter: Payer: Self-pay | Admitting: Internal Medicine

## 2018-07-04 VITALS — BP 142/76 | HR 52 | Temp 97.5°F | Ht 64.75 in | Wt 255.4 lb

## 2018-07-04 DIAGNOSIS — M1990 Unspecified osteoarthritis, unspecified site: Secondary | ICD-10-CM

## 2018-07-04 DIAGNOSIS — Z23 Encounter for immunization: Secondary | ICD-10-CM | POA: Diagnosis not present

## 2018-07-04 DIAGNOSIS — I10 Essential (primary) hypertension: Secondary | ICD-10-CM | POA: Diagnosis not present

## 2018-07-04 DIAGNOSIS — R8290 Unspecified abnormal findings in urine: Secondary | ICD-10-CM

## 2018-07-04 DIAGNOSIS — R350 Frequency of micturition: Secondary | ICD-10-CM

## 2018-07-04 DIAGNOSIS — E119 Type 2 diabetes mellitus without complications: Secondary | ICD-10-CM | POA: Diagnosis not present

## 2018-07-04 DIAGNOSIS — R8271 Bacteriuria: Secondary | ICD-10-CM | POA: Diagnosis not present

## 2018-07-04 LAB — COMPREHENSIVE METABOLIC PANEL
ALT: 8 U/L (ref 0–35)
AST: 13 U/L (ref 0–37)
Albumin: 3.7 g/dL (ref 3.5–5.2)
Alkaline Phosphatase: 60 U/L (ref 39–117)
BILIRUBIN TOTAL: 0.5 mg/dL (ref 0.2–1.2)
BUN: 11 mg/dL (ref 6–23)
CO2: 31 mEq/L (ref 19–32)
Calcium: 9.6 mg/dL (ref 8.4–10.5)
Chloride: 102 mEq/L (ref 96–112)
Creatinine, Ser: 0.74 mg/dL (ref 0.40–1.20)
GFR: 92.25 mL/min (ref >60.00)
Glucose, Bld: 111 mg/dL — ABNORMAL HIGH (ref 70–99)
Potassium: 3.9 mEq/L (ref 3.5–5.1)
Sodium: 139 mEq/L (ref 135–145)
Total Protein: 6.9 g/dL (ref 6.0–8.3)

## 2018-07-04 LAB — HEMOGLOBIN A1C: Hgb A1c MFr Bld: 6.4 % (ref 4.6–6.5)

## 2018-07-04 LAB — CBC WITH DIFFERENTIAL/PLATELET
BASOS ABS: 0.1 10*3/uL (ref 0.0–0.1)
Basophils Relative: 0.8 % (ref 0.0–3.0)
Eosinophils Absolute: 0.1 10*3/uL (ref 0.0–0.7)
Eosinophils Relative: 1.6 % (ref 0.0–5.0)
HCT: 37.1 % (ref 36.0–46.0)
Hemoglobin: 11.7 g/dL — ABNORMAL LOW (ref 12.0–15.0)
Lymphocytes Relative: 31.7 % (ref 12.0–46.0)
Lymphs Abs: 2.7 10*3/uL (ref 0.7–4.0)
MCHC: 31.6 g/dL (ref 30.0–36.0)
MCV: 80.5 fl (ref 78.0–100.0)
MONOS PCT: 5.8 % (ref 3.0–12.0)
Monocytes Absolute: 0.5 10*3/uL (ref 0.1–1.0)
Neutro Abs: 5.1 10*3/uL (ref 1.4–7.7)
Neutrophils Relative %: 60.1 % (ref 43.0–77.0)
Platelets: 279 10*3/uL (ref 150.0–400.0)
RBC: 4.61 Mil/uL (ref 3.87–5.11)
RDW: 14 % (ref 11.5–15.5)
WBC: 8.4 10*3/uL (ref 4.0–10.5)

## 2018-07-04 MED ORDER — DICLOFENAC SODIUM 1 % TD GEL: 2.0000 g | Freq: Four times a day (QID) | TRANSDERMAL | 11 refills | Status: DC

## 2018-07-04 MED ORDER — LOSARTAN POTASSIUM-HCTZ 100-25 MG PO TABS: 1.0000 | ORAL_TABLET | Freq: Every day | ORAL | 3 refills | Status: DC

## 2018-07-04 NOTE — Progress Notes (Signed)
Pre visit review using our clinic review tool, if applicable. No additional management support is needed unless otherwise documented below in the visit note. 

## 2018-07-04 NOTE — Progress Notes (Signed)
Chief Complaint  Patient presents with  . Follow-up   F/u  1. HTN norvasc 10 mg qd, coreg 25 bid, losartan 100/12.5 mg qd sl elevated today  2. DM 2 on metformin  3. OA mild neck neck with improved ROM  4. Anal pain better after surgery after stools    Review of Systems  Constitutional: Negative for weight loss.  HENT: Negative for hearing loss.   Eyes: Negative for blurred vision.  Respiratory: Negative for shortness of breath.   Gastrointestinal: Negative for abdominal pain.  Genitourinary: Positive for frequency. Negative for dysuria.  Musculoskeletal: Negative for neck pain.  Skin: Negative for rash.  Neurological: Negative for headaches.  Psychiatric/Behavioral: Negative for depression.   Past Medical History:  Diagnosis Date  . Arthritis   . Diabetes ([place]) 02/19/2015  . Diverticulosis 12/07/2016   Sigmoid Colon  . Diverticulosis   . Diverticulosis of sigmoid colon 12/08/2016  . Elevated blood sugar 01/15/2015  . GERD (gastroesophageal reflux disease)   . Hemorrhoids   . History of blood transfusion   . Hyperlipidemia   . Hypertension   . Hypertension 01/15/2015  . Obesity 01/15/2015  . OSA on CPAP   . Sleep apnea    Past Surgical History:  Procedure Laterality Date  . BOTOX INJECTION N/A 03/27/2018   Procedure: BOTOX INJECTION;  Surgeon: Jules Husbands, MD;  Location: [facility];  Service: General;  Laterality: N/A;  . COLONOSCOPY    . COLONOSCOPY WITH PROPOFOL N/A 12/07/2016   Procedure: COLONOSCOPY WITH PROPOFOL;  Surgeon: [name], MD;  Location: [place];  Service: Endoscopy;  Laterality: N/A;  Diabetic - oral meds  . EVALUATION UNDER ANESTHESIA WITH ANAL FISSUROTOMY N/A 03/27/2018   Procedure: EXAM UNDER ANESTHESIA, CHEMICAL SPHINCTEROTOMY;  Surgeon: Jules Husbands, MD;  Location: [facility];  Service: General;  Laterality: N/A;  . HEMORRHOID SURGERY N/A 06/13/2018   Procedure: [id number];  Surgeon: Jules Husbands, MD;  Location: [facility];   Service: General;  Laterality: N/A;  . JOINT REPLACEMENT     b/l hips in [place] 2004 and 2005 Dr. Alfonso Ramus   . POLYPECTOMY  12/07/2016   Procedure: POLYPECTOMY;  Surgeon: [name], MD;  Location: [place];  Service: Endoscopy;;  . RECTAL EXAM UNDER ANESTHESIA N/A 06/13/2018   Procedure: RECTAL EXAM UNDER ANESTHESIA;  Surgeon: Jules Husbands, MD;  Location: [facility];  Service: General;  Laterality: N/A;  . TOTAL HIP ARTHROPLASTY Bilateral   . TUBAL LIGATION     Family History  Problem Relation Age of Onset  . Dementia Mother   . Hypertension Mother   . Arthritis Mother   . Alcohol abuse Father   . Cirrhosis Father   . Cancer Daughter        breast  . COPD Brother   . Breast cancer Sister 57  . Cancer Sister        ? type   . Cancer Brother        ?type  . Cancer Brother        cancer ?type  . Cancer Daughter        cancer ?type died 41    Social History   Socioeconomic History  . Marital status: Divorced    Spouse name: Not on file  . Number of children: Not on file  . Years of education: Not on file  . Highest education level: Not on file  Occupational History  . 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 Financial resource strain: Not on file  . Food insecurity:    Worry: Not on file    Inability: Not on file  . Transportation needs:    Medical: Not on file    Non-medical: Not on file  Tobacco Use  . Smoking status: Never Smoker  . Smokeless tobacco: Never Used  Substance and Sexual Activity  . Alcohol use: No    Alcohol/week: 0.0 standard drinks  . Drug use: No  . Sexual activity: Not Currently  Lifestyle  . Physical activity:    Days per week: Not on file    Minutes per session: Not on file  . Stress: Not on file  Relationships  . Social connections:    Talks on phone: Not on file    Gets together: Not on file    Attends religious service: Not on file    Active member of club or  organization: Not on file    Attends meetings of clubs or organizations: Not on file    Relationship status: Not on file  . Intimate partner violence:    Fear of current or ex partner: Not on file    Emotionally abused: Not on file    Physically abused: Not on file    Forced sexual activity: Not on file  Other Topics Concern  . Not on file  Social History Narrative   Lives with daughter [name] to go to church    Moved from [place] 3-4 years ago    No guns, wears seat belt    Never smoker    Current Meds  Medication Sig  . amLODipine (NORVASC) 10 MG tablet TAKE 1 TABLET(10 MG) BY MOUTH DAILY (Patient taking differently: Take 10 mg by mouth daily. )  . atorvastatin (LIPITOR) 10 MG tablet TAKE 1 TABLET(10 MG) BY MOUTH DAILY (Patient taking differently: Take 10 mg by mouth daily. )  . Calcium Carb-Cholecalciferol (CALCIUM 600+D) 600-800 MG-UNIT TABS Take 1 tablet by mouth 2 (two) times daily.  . carvedilol (COREG) 25 MG tablet Take 25 mg by mouth 2 (two) times daily.  Noelle Penner FIBER SUPPLEMENT PO Take 1 Dose by mouth daily.   . metFORMIN (GLUCOPHAGE) 500 MG tablet TAKE 1 TABLET BY MOUTH ONCE DAILY WITH BREAKFAST (Patient taking differently: Take 500 mg by mouth daily with breakfast. )  . Omega-3 Fatty Acids (FISH OIL) 1000 MG CAPS Take 1,000 mg by mouth 2 (two) times daily.   Marland Kitchen omeprazole (PRILOSEC) 20 MG capsule TAKE 1 CAPSULE(20 MG) BY MOUTH DAILY (Patient taking differently: Take 20 mg by mouth daily. )  . UNABLE TO FIND Med Name: 0.3% Nifedipine + 1.5% Lidocaine in [facility]- Place on Anal area TID. (Patient taking differently: Place 1 application rectally 3 (three) times daily. Med Name: 0.3% Nifedipine + 1.5% Lidocaine in [facility]- Place on Anal area TID.)  . [DISCONTINUED] losartan-hydrochlorothiazide (HYZAAR) 100-12.5 MG tablet Take 1 tablet by mouth daily.   No Known Allergies Recent Results (from the past 2160 hour(s))  Glucose, capillary     Status: Abnormal    Collection Time: 06/13/18 10:19 AM  Result Value Ref Range   Glucose-Capillary 104 (H) 70 - 99 mg/dL  Surgical pathology     Status: None   Collection Time: 06/13/18 12:42 PM  Result Value Ref Range   SURGICAL PATHOLOGY      Surgical Pathology CASE: [id number] PATIENT: [place] Surgical Pathology Report  SPECIMEN SUBMITTED: A. Hemorrhoids  CLINICAL HISTORY: None provided  PRE-OPERATIVE DIAGNOSIS: Hemorrhoids  POST-OPERATIVE DIAGNOSIS: Hemorrhoids     DIAGNOSIS: A.  HEMORRHOIDS; HEMORRHOIDECTOMY: - HEMORRHOID. - NEGATIVE FOR DYSPLASIA AND MALIGNANCY.   GROSS DESCRIPTION: A. Labeled: Hemorrhoids Received: Formalin Tissue fragment(s): 2 Size: 1.1 x 1.0 x 0.6 cm and 1.3 x 0.9 x 0.6 cm Description: Irregular fragments of tan-pink, mucosal soft tissue. Sectioning displays a slightly edematous cut surface with dilated vasculature up to 0.2 cm in diameter.  No abnormalities are grossly identified. Representative sections are submitted in cassette 1.   Final Diagnosis performed by Quay Burow, MD.   Electronically signed 06/14/2018 11:05:24AM The electronic signature indicates that the named Attending Pathologist has evaluated the specimen  Technical componen t performed at [facility], [address], [place], [place] [address] Lab: [phone] Dir: Rush Farmer, MD, MMM  Professional component performed at [facility], [facility], [place], [place], [place] [address] Lab: [phone] Dir: Dellia Nims. Rubinas, MD   Glucose, capillary     Status: None   Collection Time: 06/13/18  2:05 PM  Result Value Ref Range   Glucose-Capillary 81 70 - 99 mg/dL   Objective  Body mass index is 42.83 kg/m. Wt Readings from Last 3 Encounters:  07/04/18 255 lb 6.4 oz (115.8 kg)  06/26/18 254 lb 12.8 oz (115.6 kg)  06/12/18 257 lb 6 oz (116.7 kg)   Temp Readings from Last 3 Encounters:  07/04/18 (!) 97.5 F (36.4 C) (Oral)  06/26/18 97.7 F  (36.5 C) (Temporal)  06/13/18 (!) 97 F (36.1 C) (Temporal)   BP Readings from Last 3 Encounters:  07/04/18 (!) 142/76  06/26/18 (!) 157/108  06/13/18 134/60   Pulse Readings from Last 3 Encounters:  07/04/18 (!) 52  06/26/18 71  06/13/18 61    Physical Exam Vitals signs and nursing note reviewed.  Constitutional:      Appearance: Normal appearance. She is well-developed and well-groomed. She is morbidly obese.  HENT:     Head: Normocephalic and atraumatic.     Nose: Nose normal.     Mouth/Throat:     Mouth: Mucous membranes are moist.     Pharynx: Oropharynx is clear.  Eyes:     Conjunctiva/sclera: Conjunctivae normal.     Pupils: Pupils are equal, round, and reactive to light.  Cardiovascular:     Rate and Rhythm: Regular rhythm. Bradycardia present.     Pulses: Normal pulses.     Heart sounds: Normal heart sounds.  Pulmonary:     Effort: Pulmonary effort is normal.     Breath sounds: Normal breath sounds.  Skin:    General: Skin is warm and dry.  Neurological:     General: No focal deficit present.     Mental Status: She is alert and oriented to person, place, and time. Mental status is at baseline.     Gait: Gait normal.  Psychiatric:        Attention and Perception: Attention and perception normal.        Mood and Affect: Mood and affect normal.        Speech: Speech normal.        Behavior: Behavior normal. Behavior is cooperative.        Thought Content: Thought content normal.        Cognition and Memory: Cognition and memory normal.        Judgment: Judgment normal.     Assessment   1. HTN  2. DM  2 6.2  3. OA neck  4. HM Plan   1. Cont meds increase losartan 100/12.5 hct to 100/25 mg qd  2. Cont meds  Check CMET, CBC, A1C, UA and culture today  Do foot exam and ask eye exam at f/u  3. voltaren gel  Prn Tylenol  4.  Flu shot utd  Consider Tdap in future given Rx today  pna 23 had 03/16/14  prevnar given today  Had zostervax consider  shingirix in future given Rx today  Mammogram referred today due 08/02/18 set up [place] [facility] 08/05/2018  Out of age window pap  Colonoscopy had 12/07/16 IH, diverticulosis and tubular adenoma f/u in 5 years Dr. Allen Norris  DEXA get report prior [facility] Dr. Qu[facility] had [facility] years ago  -consider repeat  Sleep study sch 09/2018   Provider: Dr. Olivia Mackie McLean-Scocuzza-Internal Medicine

## 2018-07-04 NOTE — Patient Instructions (Signed)
Hypertension Hypertension, commonly called high blood pressure, is when the force of blood pumping through the arteries is too strong. The arteries are the blood vessels that carry blood from the heart throughout the body. Hypertension forces the heart to work harder to pump blood and may cause arteries to become narrow or stiff. Having untreated or uncontrolled hypertension can cause heart attacks, strokes, kidney disease, and other problems. A blood pressure reading consists of a higher number over a lower number. Ideally, your blood pressure should be below 120/80. The first ("top") number is called the systolic pressure. It is a measure of the pressure in your arteries as your heart beats. The second ("bottom") number is called the diastolic pressure. It is a measure of the pressure in your arteries as the heart relaxes. What are the causes? The cause of this condition is not known. What increases the risk? Some risk factors for high blood pressure are under your control. Others are not. Factors you can change  Smoking.  Having type 2 diabetes mellitus, high cholesterol, or both.  Not getting enough exercise or physical activity.  Being overweight.  Having too much fat, sugar, calories, or salt (sodium) in your diet.  Drinking too much alcohol. Factors that are difficult or impossible to change  Having chronic kidney disease.  Having a family history of high blood pressure.  Age. Risk increases with age.  Race. You may be at higher risk if you are African-American.  Gender. Men are at higher risk than women before age 45. After age 65, women are at higher risk than men.  Having obstructive sleep apnea.  Stress. What are the signs or symptoms? Extremely high blood pressure (hypertensive crisis) may cause:  Headache.  Anxiety.  Shortness of breath.  Nosebleed.  Nausea and vomiting.  Severe chest pain.  Jerky movements you cannot control (seizures). How is this  diagnosed? This condition is diagnosed by measuring your blood pressure while you are seated, with your arm resting on a surface. The cuff of the blood pressure monitor will be placed directly against the skin of your upper arm at the level of your heart. It should be measured at least twice using the same arm. Certain conditions can cause a difference in blood pressure between your right and left arms. Certain factors can cause blood pressure readings to be lower or higher than normal (elevated) for a short period of time:  When your blood pressure is higher when you are in a health care provider's office than when you are at home, this is called white coat hypertension. Most people with this condition do not need medicines.  When your blood pressure is higher at home than when you are in a health care provider's office, this is called masked hypertension. Most people with this condition may need medicines to control blood pressure. If you have a high blood pressure reading during one visit or you have normal blood pressure with other risk factors:  You may be asked to return on a different day to have your blood pressure checked again.  You may be asked to monitor your blood pressure at home for 1 week or longer. If you are diagnosed with hypertension, you may have other blood or imaging tests to help your health care provider understand your overall risk for other conditions. How is this treated? This condition is treated by making healthy lifestyle changes, such as eating healthy foods, exercising more, and reducing your alcohol intake. Your health care provider   may prescribe medicine if lifestyle changes are not enough to get your blood pressure under control, and if:  Your systolic blood pressure is above 130.  Your diastolic blood pressure is above 80. Your personal target blood pressure may vary depending on your medical conditions, your age, and other factors. Follow these instructions  at home: Eating and drinking   Eat a diet that is high in fiber and potassium, and low in sodium, added sugar, and fat. An example eating plan is called the DASH (Dietary Approaches to Stop Hypertension) diet. To eat this way: ? Eat plenty of fresh fruits and vegetables. Try to fill half of your plate at each meal with fruits and vegetables. ? Eat whole grains, such as whole wheat pasta, brown rice, or whole grain bread. Fill about one quarter of your plate with whole grains. ? Eat or drink low-fat dairy products, such as skim milk or low-fat yogurt. ? Avoid fatty cuts of meat, processed or cured meats, and poultry with skin. Fill about one quarter of your plate with lean proteins, such as fish, chicken without skin, beans, eggs, and tofu. ? Avoid premade and processed foods. These tend to be higher in sodium, added sugar, and fat.  Reduce your daily sodium intake. Most people with hypertension should eat less than 1,500 mg of sodium a day.  Limit alcohol intake to no more than 1 drink a day for nonpregnant women and 2 drinks a day for men. One drink equals 12 oz of beer, 5 oz of wine, or 1 oz of hard liquor. Lifestyle   Work with your health care provider to maintain a healthy body weight or to lose weight. Ask what an ideal weight is for you.  Get at least 30 minutes of exercise that causes your heart to beat faster (aerobic exercise) most days of the week. Activities may include walking, swimming, or biking.  Include exercise to strengthen your muscles (resistance exercise), such as pilates or lifting weights, as part of your weekly exercise routine. Try to do these types of exercises for 30 minutes at least 3 days a week.  Do not use any products that contain nicotine or tobacco, such as cigarettes and e-cigarettes. If you need help quitting, ask your health care provider.  Monitor your blood pressure at home as told by your health care provider.  Keep all follow-up visits as told by  your health care provider. This is important. Medicines  Take over-the-counter and prescription medicines only as told by your health care provider. Follow directions carefully. Blood pressure medicines must be taken as prescribed.  Do not skip doses of blood pressure medicine. Doing this puts you at risk for problems and can make the medicine less effective.  Ask your health care provider about side effects or reactions to medicines that you should watch for. Contact a health care provider if:  You think you are having a reaction to a medicine you are taking.  You have headaches that keep coming back (recurring).  You feel dizzy.  You have swelling in your ankles.  You have trouble with your vision. Get help right away if:  You develop a severe headache or confusion.  You have unusual weakness or numbness.  You feel faint.  You have severe pain in your chest or abdomen.  You vomit repeatedly.  You have trouble breathing. Summary  Hypertension is when the force of blood pumping through your arteries is too strong. If this condition is not controlled, it   may put you at risk for serious complications.  Your personal target blood pressure may vary depending on your medical conditions, your age, and other factors. For most people, a normal blood pressure is less than 120/80.  Hypertension is treated with lifestyle changes, medicines, or a combination of both. Lifestyle changes include weight loss, eating a healthy, low-sodium diet, exercising more, and limiting alcohol. This information is not intended to replace advice given to you by your health care provider. Make sure you discuss any questions you have with your health care provider. Document Released: 05/22/2005 Document Revised: 04/19/2016 Document Reviewed: 04/19/2016 Elsevier Interactive Patient Education  2019 Else[employer] Inc. DASH Eating Plan DASH stands for "Dietary Approaches to Stop Hypertension." The DASH eating  plan is a healthy eating plan that has been shown to reduce high blood pressure (hypertension). It may also reduce your risk for type 2 diabetes, heart disease, and stroke. The DASH eating plan may also help with weight loss. What are tips for following this plan?  General guidelines  Avoid eating more than 2,300 mg (milligrams) of salt (sodium) a day. If you have hypertension, you may need to reduce your sodium intake to 1,500 mg a day.  Limit alcohol intake to no more than 1 drink a day for nonpregnant women and 2 drinks a day for men. One drink equals 12 oz of beer, 5 oz of wine, or 1 oz of hard liquor.  Work with your health care provider to maintain a healthy body weight or to lose weight. Ask what an ideal weight is for you.  Get at least 30 minutes of exercise that causes your heart to beat faster (aerobic exercise) most days of the week. Activities may include walking, swimming, or biking.  Work with your health care provider or diet and nutrition specialist (dietitian) to adjust your eating plan to your individual calorie needs. Reading food labels   Check food labels for the amount of sodium per serving. Choose foods with less than 5 percent of the Daily Value of sodium. Generally, foods with less than 300 mg of sodium per serving fit into this eating plan.  To find whole grains, look for the word "whole" as the first word in the ingredient list. Shopping  Buy products labeled as "low-sodium" or "no salt added."  Buy fresh foods. Avoid canned foods and premade or frozen meals. Cooking  Avoid adding salt when cooking. Use salt-free seasonings or herbs instead of table salt or sea salt. Check with your health care provider or pharmacist before using salt substitutes.  Do not fry foods. Cook foods using healthy methods such as baking, boiling, grilling, and broiling instead.  Cook with heart-healthy oils, such as olive, canola, soybean, or sunflower oil. Meal planning  Eat a  balanced diet that includes: ? 5 or more servings of fruits and vegetables each day. At each meal, try to fill half of your plate with fruits and vegetables. ? Up to 6-8 servings of whole grains each day. ? Less than 6 oz of lean meat, poultry, or fish each day. A 3-oz serving of meat is about the same size as a deck of cards. One egg equals 1 oz. ? 2 servings of low-fat dairy each day. ? A serving of nuts, seeds, or beans 5 times each week. ? Heart-healthy fats. Healthy fats called Omega-3 fatty acids are found in foods such as flaxseeds and coldwater fish, like sardines, salmon, and mackerel.  Limit how much you eat of the following: ?   Canned or prepackaged foods. ? Food that is high in trans fat, such as fried foods. ? Food that is high in saturated fat, such as fatty meat. ? Sweets, desserts, sugary drinks, and other foods with added sugar. ? Full-fat dairy products.  Do not salt foods before eating.  Try to eat at least 2 vegetarian meals each week.  Eat more home-cooked food and less restaurant, buffet, and fast food.  When eating at a restaurant, ask that your food be prepared with less salt or no salt, if possible. What foods are recommended? The items listed may not be a complete list. Talk with your dietitian about what dietary choices are best for you. Grains Whole-grain or whole-wheat bread. Whole-grain or whole-wheat pasta. Brown rice. Oatmeal. Quinoa. Bulgur. Whole-grain and low-sodium cereals. Pita bread. Low-fat, low-sodium crackers. Whole-wheat flour tortillas. Vegetables Fresh or frozen vegetables (raw, steamed, roasted, or grilled). Low-sodium or reduced-sodium tomato and vegetable juice. Low-sodium or reduced-sodium tomato sauce and tomato paste. Low-sodium or reduced-sodium canned vegetables. Fruits All fresh, dried, or frozen fruit. Canned fruit in natural juice (without added sugar). Meat and other protein foods Skinless chicken or turkey. Ground chicken or  turkey. Pork with fat trimmed off. Fish and seafood. Egg whites. Dried beans, peas, or lentils. Unsalted nuts, nut butters, and seeds. Unsalted canned beans. Lean cuts of beef with fat trimmed off. Low-sodium, lean deli meat. Dairy Low-fat (1%) or fat-free (skim) milk. Fat-free, low-fat, or reduced-fat cheeses. Nonfat, low-sodium ricotta or cottage cheese. Low-fat or nonfat yogurt. Low-fat, low-sodium cheese. Fats and oils Soft margarine without trans fats. Vegetable oil. Low-fat, reduced-fat, or light mayonnaise and salad dressings (reduced-sodium). Canola, safflower, olive, soybean, and sunflower oils. Avocado. Seasoning and other foods Herbs. Spices. Seasoning mixes without salt. Unsalted popcorn and pretzels. Fat-free sweets. What foods are not recommended? The items listed may not be a complete list. Talk with your dietitian about what dietary choices are best for you. Grains Baked goods made with fat, such as croissants, muffins, or some breads. Dry pasta or rice meal packs. Vegetables Creamed or fried vegetables. Vegetables in a cheese sauce. Regular canned vegetables (not low-sodium or reduced-sodium). Regular canned tomato sauce and paste (not low-sodium or reduced-sodium). Regular tomato and vegetable juice (not low-sodium or reduced-sodium). Pickles. Olives. Fruits Canned fruit in a light or heavy syrup. Fried fruit. Fruit in cream or butter sauce. Meat and other protein foods Fatty cuts of meat. Ribs. Fried meat. Bacon. Sausage. Bologna and other processed lunch meats. Salami. Fatback. Hotdogs. Bratwurst. Salted nuts and seeds. Canned beans with added salt. Canned or smoked fish. Whole eggs or egg yolks. Chicken or turkey with skin. Dairy Whole or 2% milk, cream, and half-and-half. Whole or full-fat cream cheese. Whole-fat or sweetened yogurt. Full-fat cheese. Nondairy creamers. Whipped toppings. Processed cheese and cheese spreads. Fats and oils Butter. Stick margarine. Lard.  Shortening. Ghee. Bacon fat. Tropical oils, such as coconut, palm kernel, or palm oil. Seasoning and other foods Salted popcorn and pretzels. Onion salt, garlic salt, seasoned salt, table salt, and sea salt. Worcestershire sauce. Tartar sauce. Barbecue sauce. Teriyaki sauce. Soy sauce, including reduced-sodium. Steak sauce. Canned and packaged gravies. Fish sauce. Oyster sauce. Cocktail sauce. Horseradish that you find on the shelf. Ketchup. Mustard. Meat flavorings and tenderizers. Bouillon cubes. Hot sauce and Tabasco sauce. Premade or packaged marinades. Premade or packaged taco seasonings. Relishes. Regular salad dressings. Where to find more information:  National Heart, Lung, and Blood Institute: www.nhlbi.nih.gov  American Heart Association: www.heart.org Summary    The DASH eating plan is a healthy eating plan that has been shown to reduce high blood pressure (hypertension). It may also reduce your risk for type 2 diabetes, heart disease, and stroke.  With the DASH eating plan, you should limit salt (sodium) intake to 2,300 mg a day. If you have hypertension, you may need to reduce your sodium intake to 1,500 mg a day.  When on the DASH eating plan, aim to eat more fresh fruits and vegetables, whole grains, lean proteins, low-fat dairy, and heart-healthy fats.  Work with your health care provider or diet and nutrition specialist (dietitian) to adjust your eating plan to your individual calorie needs. This information is not intended to replace advice given to you by your health care provider. Make sure you discuss any questions you have with your health care provider. Document Released: 05/11/2011 Document Revised: 05/15/2016 Document Reviewed: 05/15/2016 Elsevier Interactive Patient Education  2019 Elsevier Inc.  

## 2018-07-06 LAB — URINE CULTURE
MICRO NUMBER:: 127983
SPECIMEN QUALITY:: ADEQUATE

## 2018-07-06 LAB — URINALYSIS, ROUTINE W REFLEX MICROSCOPIC
BILIRUBIN URINE: NEGATIVE
Glucose, UA: NEGATIVE
HGB URINE DIPSTICK: NEGATIVE
Hyaline Cast: NONE SEEN /LPF
KETONES UR: NEGATIVE
NITRITE: NEGATIVE
Protein, ur: NEGATIVE
RBC / HPF: NONE SEEN /HPF (ref 0–2)
Specific Gravity, Urine: 1.007 (ref 1.001–1.03)
Squamous Epithelial / HPF: NONE SEEN /HPF (ref <=5)
pH: 7.5 (ref 5.0–8.0)

## 2018-07-17 ENCOUNTER — Other Ambulatory Visit: Payer: Self-pay

## 2018-07-17 ENCOUNTER — Ambulatory Visit (INDEPENDENT_AMBULATORY_CARE_PROVIDER_SITE_OTHER): Payer: Medicare HMO | Admitting: Surgery

## 2018-07-17 ENCOUNTER — Encounter: Payer: Self-pay | Admitting: Surgery

## 2018-07-17 VITALS — BP 135/69 | HR 61 | Temp 97.3°F | Resp 16 | Ht 64.0 in | Wt 254.2 lb

## 2018-07-17 DIAGNOSIS — Z09 Encounter for follow-up examination after completed treatment for conditions other than malignant neoplasm: Secondary | ICD-10-CM

## 2018-07-17 NOTE — Patient Instructions (Signed)
Please call if you have any questions or concerns.  °

## 2018-07-17 NOTE — Progress Notes (Signed)
S/p hemorrhoidectomy  Doing very well No pain No hematochezia  PE NAD Abd: soft, nt, pt prefers not to have rectal today  A/p doing very well w/o complications RTC prn

## 2018-08-05 ENCOUNTER — Ambulatory Visit
Admission: RE | Admit: 2018-08-05 | Discharge: 2018-08-05 | Disposition: A | Discharge status: Home / Self Care | Payer: Medicare HMO | Source: Ambulatory Visit | Attending: Internal Medicine | Admitting: Internal Medicine

## 2018-08-05 DIAGNOSIS — Z1231 Encounter for screening mammogram for malignant neoplasm of breast: Secondary | ICD-10-CM | POA: Diagnosis not present

## 2018-08-08 ENCOUNTER — Ambulatory Visit: Payer: Medicare HMO

## 2018-08-13 ENCOUNTER — Telehealth: Payer: Self-pay

## 2018-08-13 MED ORDER — METFORMIN HCL 500 MG PO TABS: ORAL_TABLET | ORAL | 1 refills | Status: DC

## 2018-08-13 NOTE — Telephone Encounter (Signed)
Copied from [place] [id number]. Topic: General - Other >> Aug 13, 2018  3:05 PM Carolyn Stare wrote:  Pt req a refill on metFORMIN (GLUCOPHAGE) 500 MG tablet    [facility] has not filled this before    [facility] [place]

## 2018-08-13 NOTE — Addendum Note (Signed)
Addended by: Elpidio Galea T on: 08/13/2018 03:17 PM   Modules accepted: Orders

## 2018-09-15 ENCOUNTER — Other Ambulatory Visit: Payer: Self-pay | Admitting: Family Medicine

## 2018-09-20 ENCOUNTER — Telehealth: Payer: Self-pay | Admitting: Internal Medicine

## 2018-09-20 NOTE — Telephone Encounter (Signed)
Received home visit 07/18/2018 [facility] DO with [place] BP was sitting 172/75 standing 160/78 with HR 55 and 64   Has she been checking her BP at home?   Fransisco Beau,  -->Change 10/03/2018 visit to [place] or telephone   [place]

## 2018-09-23 NOTE — Telephone Encounter (Signed)
Patient is a telephone visit number in appointment notes.  She has been checking BP nut she doesn't have access to list. She stated she will have them on appointment on [id number].

## 2018-09-30 ENCOUNTER — Telehealth: Payer: Self-pay | Admitting: Internal Medicine

## 2018-09-30 NOTE — Telephone Encounter (Signed)
Spoke with patient's daughter and PFT has been scheduled for Tues 11/12/2018 at 10:30 and ROV with Dr. Ashby Dawes scheduled for Tuesday 11/19/2018 at 10:30. Daughter is aware of both appointments and appointment information with PFT instructions mailed to patient. Rhonda J Cobb

## 2018-10-03 ENCOUNTER — Other Ambulatory Visit: Payer: Self-pay

## 2018-10-03 ENCOUNTER — Ambulatory Visit (INDEPENDENT_AMBULATORY_CARE_PROVIDER_SITE_OTHER): Payer: Medicare HMO | Admitting: Internal Medicine

## 2018-10-03 DIAGNOSIS — E119 Type 2 diabetes mellitus without complications: Secondary | ICD-10-CM

## 2018-10-03 DIAGNOSIS — I10 Essential (primary) hypertension: Secondary | ICD-10-CM

## 2018-10-03 DIAGNOSIS — E785 Hyperlipidemia, unspecified: Secondary | ICD-10-CM

## 2018-10-03 DIAGNOSIS — K219 Gastro-esophageal reflux disease without esophagitis: Secondary | ICD-10-CM | POA: Diagnosis not present

## 2018-10-03 DIAGNOSIS — M542 Cervicalgia: Secondary | ICD-10-CM

## 2018-10-03 DIAGNOSIS — I159 Secondary hypertension, unspecified: Secondary | ICD-10-CM

## 2018-10-03 DIAGNOSIS — M1990 Unspecified osteoarthritis, unspecified site: Secondary | ICD-10-CM

## 2018-10-03 MED ORDER — CARVEDILOL 25 MG PO TABS: 25.0000 mg | ORAL_TABLET | Freq: Two times a day (BID) | ORAL | 3 refills | Status: DC

## 2018-10-03 MED ORDER — LOSARTAN POTASSIUM-HCTZ 100-25 MG PO TABS: 1.0000 | ORAL_TABLET | Freq: Every day | ORAL | 3 refills | Status: DC

## 2018-10-03 MED ORDER — OMEPRAZOLE 20 MG PO CPDR: 20.0000 mg | DELAYED_RELEASE_CAPSULE | Freq: Every day | ORAL | 3 refills | Status: DC

## 2018-10-03 MED ORDER — AMLODIPINE BESYLATE 10 MG PO TABS: 10.0000 mg | ORAL_TABLET | Freq: Every day | ORAL | 3 refills | Status: DC

## 2018-10-03 MED ORDER — METFORMIN HCL 500 MG PO TABS: ORAL_TABLET | ORAL | 3 refills | Status: DC

## 2018-10-03 MED ORDER — ATORVASTATIN CALCIUM 10 MG PO TABS: 10.0000 mg | ORAL_TABLET | Freq: Every day | ORAL | 3 refills | Status: DC

## 2018-10-03 NOTE — Progress Notes (Signed)
Telephone Note  I connected with [facility]   on 10/03/18 at  9:30 AM EDT by telephone and verified that I am speaking with the correct person using two identifiers.  Location patient: home Location provider:work  Persons participating in the virtual visit: patient, provider, pts daughter  I discussed the limitations of evaluation and management by telemedicine and the availability of in person appointments. The patient expressed understanding and agreed to proceed.   HPI: 1. HTn BP was on 07/18/2018 172/75 then 160/78 she is not sure if she had her meds but for pt getting sbp 127/137/131/143/141 over dbp 69-72 on norvasc 10 mg qd, coreg 25 mg bid, hyzaar 100-25 she does report urinary incontinence with increase dose of hctz from 12.5 to 25 mg qd  2. She needs refills on all meds  3. Cervical DDD/arthritis trouble with rom turning neck to the right hurts tried heat no tylenol the voltaren gel is not helping  4. Pending pfts for sob on exertion 11/12/2018 and f/u Dr. Juanell Fairly 11/19/2018  5. DM 2 controlled needs refill of metformin reports eating better   ROS: See pertinent positives and negatives per HPI.  Past Medical History:  Diagnosis Date  . Arthritis   . Diabetes ([place]) 02/19/2015  . Diverticulosis 12/07/2016   Sigmoid Colon  . Diverticulosis   . Diverticulosis of sigmoid colon 12/08/2016  . Elevated blood sugar 01/15/2015  . GERD (gastroesophageal reflux disease)   . Hemorrhoids   . History of blood transfusion   . Hyperlipidemia   . Hypertension   . Hypertension 01/15/2015  . Obesity 01/15/2015  . OSA on CPAP   . Sleep apnea     Past Surgical History:  Procedure Laterality Date  . BOTOX INJECTION N/A 03/27/2018   Procedure: BOTOX INJECTION;  Surgeon: Jules Husbands, MD;  Location: [facility];  Service: General;  Laterality: N/A;  . COLONOSCOPY    . COLONOSCOPY WITH PROPOFOL N/A 12/07/2016   Procedure: COLONOSCOPY WITH PROPOFOL;  Surgeon: [name], MD;  Location: [place];  Service: Endoscopy;  Laterality: N/A;  Diabetic - oral meds  . EVALUATION UNDER ANESTHESIA WITH ANAL FISSUROTOMY N/A 03/27/2018   Procedure: EXAM UNDER ANESTHESIA, CHEMICAL SPHINCTEROTOMY;  Surgeon: Jules Husbands, MD;  Location: [facility];  Service: General;  Laterality: N/A;  . HEMORRHOID SURGERY N/A 06/13/2018   Procedure: [id number];  Surgeon: Jules Husbands, MD;  Location: [facility];  Service: General;  Laterality: N/A;  . JOINT REPLACEMENT     b/l hips in [place] 2004 and 2005 Dr. Alfonso Ramus   . POLYPECTOMY  12/07/2016   Procedure: POLYPECTOMY;  Surgeon: [name], MD;  Location: [place];  Service: Endoscopy;;  . RECTAL EXAM UNDER ANESTHESIA N/A 06/13/2018   Procedure: RECTAL EXAM UNDER ANESTHESIA;  Surgeon: Jules Husbands, MD;  Location: [facility];  Service: General;  Laterality: N/A;  . TOTAL HIP ARTHROPLASTY Bilateral   . TUBAL LIGATION      Family History  Problem Relation Age of Onset  . Dementia Mother   . Hypertension Mother   . Arthritis Mother   . Alcohol abuse Father   . Cirrhosis Father   . Cancer Daughter        breast  . COPD Brother   . Breast cancer Sister 39  . Cancer Sister        ? type   . Cancer Brother        ?type  . Cancer Brother  cancer ?type  . Cancer Daughter        cancer ?type died 75     SOCIAL HX: lives with daughter    Current Outpatient Medications:  .  amLODipine (NORVASC) 10 MG tablet, Take 1 tablet (10 mg total) by mouth daily., Disp: 90 tablet, Rfl: 3 .  atorvastatin (LIPITOR) 10 MG tablet, Take 1 tablet (10 mg total) by mouth daily at 6 PM., Disp: 90 tablet, Rfl: 3 .  Calcium Carb-Cholecalciferol (CALCIUM 600+D) 600-800 MG-UNIT TABS, Take 1 tablet by mouth 2 (two) times daily., Disp: , Rfl:  .  carvedilol (COREG) 25 MG tablet, Take 1 tablet (25 mg total) by mouth 2 (two) times daily with a meal., Disp: 180 tablet, Rfl: 3 .  diclofenac sodium (VOLTAREN) 1 % GEL, Apply 2 g topically 4 (four) times  daily., Disp: 100 g, Rfl: 11 .  EQ FIBER SUPPLEMENT PO, Take 1 Dose by mouth daily. , Disp: , Rfl:  .  losartan-hydrochlorothiazide (HYZAAR) 100-25 MG tablet, Take 1 tablet by mouth daily. In am, Disp: 90 tablet, Rfl: 3 .  metFORMIN (GLUCOPHAGE) 500 MG tablet, 1 pill daily in the am with food, Disp: 90 tablet, Rfl: 3 .  Omega-3 Fatty Acids (FISH OIL) 1000 MG CAPS, Take 1,000 mg by mouth 2 (two) times daily. , Disp: , Rfl:  .  omeprazole (PRILOSEC) 20 MG capsule, Take 1 capsule (20 mg total) by mouth daily., Disp: 90 capsule, Rfl: 3 .  UNABLE TO FIND, Med Name: 0.3% Nifedipine + 1.5% Lidocaine in [facility]- Place on Anal area TID. (Patient taking differently: Place 1 application rectally 3 (three) times daily. Med Name: 0.3% Nifedipine + 1.5% Lidocaine in [employer]- Place on Anal area TID.), Disp: 30 g, Rfl: 1  EXAM:  VITALS per patient if applicable:  GENERAL: alert, oriented, appears well and in no acute distress  PSYCH/NEURO: pleasant and cooperative, no obvious depression or anxiety, speech and thought processing grossly intact  ASSESSMENT AND PLAN:  Discussed the following assessment and plan:  Type 2 diabetes mellitus without complication, without long-term current use of insulin ([place]) - Plan: metFORMIN (GLUCOPHAGE) 500 MG tablet qam with food  -has not seen eye MD yet  - do foot exam in person when can   Essential hypertension - Plan: amLODipine (NORVASC) 10 MG tablet, losartan-hydrochlorothiazide (HYZAAR) 100-25 MG tablet, carvedilol (COREG) 25 MG tablet bid  Consider reduce hctz 25 to 12.5 in future and add hydralazine low dose bid   Hyperlipidemia - Plan: atorvastatin (LIPITOR) 10 MG tablet  Gastroesophageal reflux disease without esophagitis - Plan: omeprazole (PRILOSEC) 20 MG capsule  Osteoarthritis,  likely causing Neck pain -rec heat, tylenol prn, voltaren gel  -consider MRI future and PT pt wants to hold for now   HM Flu shot utd  Tdap had 07/04/2018  pna  24 had 03/16/14  prevnar utd Had zostervax, shingrix had 1/2 07/04/2018   Mammogram  08/05/2018 negative Out of age window pap  Colonoscopy had 12/07/16 IH, diverticulosis and tubular adenoma f/u in 5 years Dr. Allen Norris  DEXA get report prior PCP Dr. Quillian [facility] per pt had 5 years ago  -consider repeat no record   OSA f/u pulm, pfts sch 11/12/2018 and f/u pulm 11/19/2018     I discussed the assessment and treatment plan with the patient. The patient was provided an opportunity to ask questions and all were answered. The patient agreed with the plan and demonstrated an understanding of the instructions.  The patient was advised to call back or seek an in-person evaluation if the symptoms worsen or if the condition fails to improve as anticipated.  Time spent 20 minutes  Delorise Jackson, MD

## 2018-10-08 ENCOUNTER — Ambulatory Visit: Payer: Medicare HMO

## 2018-11-12 ENCOUNTER — Ambulatory Visit: Payer: Medicare HMO

## 2018-11-15 ENCOUNTER — Other Ambulatory Visit: Payer: Self-pay

## 2018-11-19 ENCOUNTER — Ambulatory Visit: Payer: Medicare HMO | Admitting: Internal Medicine

## 2018-11-26 ENCOUNTER — Telehealth: Payer: Self-pay

## 2018-11-26 NOTE — Telephone Encounter (Signed)
Pt's daughter, Lorayne Marek [facility]) is aware of PFT and covid testing date/time.  Nothing further is needed.

## 2018-11-29 ENCOUNTER — Other Ambulatory Visit
Admission: RE | Admit: 2018-11-29 | Discharge: 2018-11-29 | Disposition: A | Discharge status: Home / Self Care | Payer: Medicare HMO | Source: Ambulatory Visit | Attending: Internal Medicine | Admitting: Internal Medicine

## 2018-11-29 ENCOUNTER — Other Ambulatory Visit: Payer: Self-pay

## 2018-11-29 DIAGNOSIS — Z1159 Encounter for screening for other viral diseases: Secondary | ICD-10-CM | POA: Diagnosis present

## 2018-11-30 LAB — NOVEL CORONAVIRUS, NAA (HOSP ORDER, SEND-OUT TO REF LAB; TAT 18-24 HRS): SARS-CoV-2, NAA: NOT DETECTED

## 2018-12-04 ENCOUNTER — Other Ambulatory Visit: Payer: Self-pay

## 2018-12-04 ENCOUNTER — Ambulatory Visit: Payer: Medicare HMO | Attending: Internal Medicine

## 2018-12-04 DIAGNOSIS — J984 Other disorders of lung: Secondary | ICD-10-CM | POA: Insufficient documentation

## 2018-12-04 DIAGNOSIS — R0609 Other forms of dyspnea: Secondary | ICD-10-CM | POA: Insufficient documentation

## 2018-12-04 DIAGNOSIS — G4733 Obstructive sleep apnea (adult) (pediatric): Secondary | ICD-10-CM | POA: Diagnosis present

## 2018-12-04 MED ORDER — ALBUTEROL SULFATE (2.5 MG/3ML) 0.083% IN NEBU
2.5000 mg | INHALATION_SOLUTION | Freq: Once | RESPIRATORY_TRACT | Status: AC
  Administered 2018-12-04: 08:00:00 2.5 mg via RESPIRATORY_TRACT
  Filled 2018-12-04: qty 3

## 2018-12-05 ENCOUNTER — Telehealth: Payer: Self-pay | Admitting: Internal Medicine

## 2018-12-05 NOTE — Progress Notes (Signed)
[facility] Consultation      Assessment and Plan:  Obstructive sleep apnea. - Patient has a history of obstructive sleep apnea. - Reviewed download, patient is currently doing well with her current device, continue CPAP nightly.  Dyspnea on exertion. - Patient is a lifelong non-smoker, she has some occupational exposures, but I suspect that her dyspnea is most likely mostly from deconditioning and obesity. - PFT shows moderate restrictive lung disease, possibly from body habitus.  Patient had a chest x-ray ordered at last visit but this was not done, will reorder.   Obesity. - Morbid obesity with BMI 44.  Likely contributing to dyspnea. - Weight loss would be beneficial for her breathing and health overall. -We also discussed increasing her physical activity will help with her breathing.  I recommended that she walk at a slow pace for 20 minutes/day.  Orders Placed This Encounter  Procedures  . DG Chest 2 View   Return in about 6 months (around 06/11/2019).   Date: 12/05/2018  MRN# [id number] Taylor Shaw [birth year]    Taylor Shaw is a 78 y.o. old female seen in consultation for chief complaint of:    Chief Complaint  Patient presents with  . Follow-up    follow-up OSA, using CPAP going well, SOB has improved, stated she did not give out on the walk down here.     HPI:   She has been on CPAP, originally about 10 years ago, she then stopped using it for 2-3 years,  But then started after her doctors advised her to restart less than a year ago. Her current machine is about 77 years old.   She has restarted using her CPAP and notes that she is more awake during the day, she is no longer snoring.  She feels that her breathing is doing ok, she has been mostly staying home.   She gets a bit winded during the day when she is walking a lot. She does not exercise. 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  She has never been a smoker, she  worked at a 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  She has never been diagnosed with respiratory problems in the past, no asthma.  She has reflux, controlled by medication, she has mild sinus drainage. She denies cough.   **PFT 12/04/2018>> tracings personally reviewed.  FVC is 56%, FEV1 is 59%, there is no significant improvement bronchodilator, ratio is 81%.  TLC is 56% predicted, flow volume loop appears restricted DLCO is 60% predicted.  Overall this test shows moderate restrictive lung disease without evidence of significant obstructive lung disease. **CPAP download data 01/10/2018-04/09/2018.>>  Raw data personally reviewed uses greater than 4 hours is 86/90 days.  Average usage on days used 7 hours 20 minutes.  Set pressure is 16 CPAP.  Leaks are minimal.  Residual AHI 0.6.  Overall this shows very good compliance with CPAP with excellent control of obstructive sleep apnea. **CPAP download 09/11/2018- 12/09/2018>> raw data personally reviewed.  Usage greater than 4 hours 85/90 days.  Average usage on days used is 8 hours 31 minutes.  Set pressure 16, leaks are within normal limits, residual AHI 0.4.  Overall this shows very good compliance with CPAP with excellent control obstructive sleep apnea.  Addendum: Reviewed outpatient records Review of outside sleep studies and outside records including office visit notes from neuro and sleep clinic  of [place] received and reviewed, 32 minutes spent, summarized below. **CPAP titration study 01/25/2011 CPAP titrated to 16. **Baseline PSG 12/14/2010: Severe obstructive sleep apnea with AHI of 39.  CPAP titration was recommended.  Medication:    Current Outpatient Medications:  .  amLODipine (NORVASC) 10 MG tablet, Take 1 tablet (10 mg total) by mouth daily., Disp: 90 tablet, Rfl: 3 .  atorvastatin (LIPITOR) 10 MG tablet, Take 1 tablet (10 mg total) by mouth daily at 6 PM., Disp: 90  tablet, Rfl: 3 .  Calcium Carb-Cholecalciferol (CALCIUM 600+D) 600-800 MG-UNIT TABS, Take 1 tablet by mouth 2 (two) times daily., Disp: , Rfl:  .  carvedilol (COREG) 25 MG tablet, Take 1 tablet (25 mg total) by mouth 2 (two) times daily with a meal., Disp: 180 tablet, Rfl: 3 .  diclofenac sodium (VOLTAREN) 1 % GEL, Apply 2 g topically 4 (four) times daily., Disp: 100 g, Rfl: 11 .  EQ FIBER SUPPLEMENT PO, Take 1 Dose by mouth daily. , Disp: , Rfl:  .  losartan-hydrochlorothiazide (HYZAAR) 100-25 MG tablet, Take 1 tablet by mouth daily. In am, Disp: 90 tablet, Rfl: 3 .  metFORMIN (GLUCOPHAGE) 500 MG tablet, 1 pill daily in the am with food, Disp: 90 tablet, Rfl: 3 .  Omega-3 Fatty Acids (FISH OIL) 1000 MG CAPS, Take 1,000 mg by mouth 2 (two) times daily. , Disp: , Rfl:  .  omeprazole (PRILOSEC) 20 MG capsule, Take 1 capsule (20 mg total) by mouth daily., Disp: 90 capsule, Rfl: 3 .  UNABLE TO FIND, Med Name: 0.3% Nifedipine + 1.5% Lidocaine in [facility]- Place on Anal area TID. (Patient taking differently: Place 1 application rectally 3 (three) times daily. Med Name: 0.3% Nifedipine + 1.5% Lidocaine in [employer]- Place on Anal area TID.), Disp: 30 g, Rfl: 1   Allergies:  Patient has no known allergies.  Review of Systems:  Constitutional: Feels well. Cardiovascular: Denies chest pain, exertional chest pain.  Pulmonary: Denies hemoptysis, pleuritic chest pain.   The remainder of systems were reviewed and were found to be negative other than what is documented in the HPI.    Physical Examination:   VS: BP 136/78 (BP Location: Left Arm, Cuff Size: Normal)   Pulse 61   Temp 98.4 F (36.9 C) (Oral)   Ht 5\' 4"  (1.626 m)   Wt 252 lb 6.4 oz (114.5 kg)   SpO2 98%   BMI 43.32 kg/m   General Appearance: No distress  Neuro:without focal findings, mental status, speech normal, alert and oriented HEENT: PERRLA, EOM intact Pulmonary: No wheezing, No rales  CardiovascularNormal S1,S2.  No  m/r/g.  Abdomen: Benign, Soft, non-tender, No masses Renal:  No costovertebral tenderness  GU:  No performed at this time. Endoc: No evident thyromegaly, no signs of acromegaly or Cushing features Skin:   warm, no rashes, no ecchymosis  Extremities: normal, no cyanosis, clubbing.    LABORATORY PANEL:   CBC No results for input(s): WBC, HGB, HCT, PLT in the last 168 hours. ------------------------------------------------------------------------------------------------------------------  Chemistries  No results for input(s): NA, K, CL, CO2, GLUCOSE, BUN, CREATININE, CALCIUM, MG, AST, ALT, ALKPHOS, BILITOT in the last 168 hours.  Invalid input(s): GFRCGP ------------------------------------------------------------------------------------------------------------------  Cardiac Enzymes No results for input(s): TROPONINI in the last 168 hours. ------------------------------------------------------------  RADIOLOGY:  No results found.     Thank  you for the consultation and for allowing [facility] to assist in the care of your patient. Our recommendations are noted  above.  Please contact us if we can be of further service.   Marda Stalker, M.D., F.C.C.P.  Board Certified in Internal Medicine, Pulmonary Medicine, [place], and Sleep Medicine.  [facility] Office Number: [phone]   12/05/2018

## 2018-12-05 NOTE — Telephone Encounter (Signed)

## 2018-12-09 ENCOUNTER — Other Ambulatory Visit: Payer: Self-pay

## 2018-12-09 ENCOUNTER — Ambulatory Visit
Admission: RE | Admit: 2018-12-09 | Discharge: 2018-12-09 | Disposition: A | Discharge status: Home / Self Care | Payer: Medicare HMO | Source: Ambulatory Visit | Attending: Internal Medicine | Admitting: Internal Medicine

## 2018-12-09 ENCOUNTER — Encounter: Payer: Self-pay | Admitting: Internal Medicine

## 2018-12-09 ENCOUNTER — Ambulatory Visit (INDEPENDENT_AMBULATORY_CARE_PROVIDER_SITE_OTHER): Payer: Medicare HMO | Admitting: Internal Medicine

## 2018-12-09 VITALS — BP 136/78 | HR 61 | Temp 98.4°F | Ht 64.0 in | Wt 252.4 lb

## 2018-12-09 DIAGNOSIS — R0609 Other forms of dyspnea: Secondary | ICD-10-CM

## 2018-12-09 DIAGNOSIS — G4733 Obstructive sleep apnea (adult) (pediatric): Secondary | ICD-10-CM | POA: Diagnosis not present

## 2018-12-09 NOTE — Patient Instructions (Addendum)
Try taking a walk once daily in the morning for 20 minutes.  Continue using cpap every night.  Will check Chest x ray.

## 2019-02-04 ENCOUNTER — Encounter: Payer: Self-pay | Admitting: Internal Medicine

## 2019-02-04 ENCOUNTER — Ambulatory Visit (INDEPENDENT_AMBULATORY_CARE_PROVIDER_SITE_OTHER): Payer: Medicare HMO | Admitting: Internal Medicine

## 2019-02-04 ENCOUNTER — Other Ambulatory Visit: Payer: Self-pay

## 2019-02-04 VITALS — BP 136/82 | HR 60 | Temp 97.7°F | Resp 15 | Ht 64.0 in | Wt 251.6 lb

## 2019-02-04 DIAGNOSIS — R600 Localized edema: Secondary | ICD-10-CM | POA: Diagnosis not present

## 2019-02-04 DIAGNOSIS — E119 Type 2 diabetes mellitus without complications: Secondary | ICD-10-CM

## 2019-02-04 DIAGNOSIS — Z23 Encounter for immunization: Secondary | ICD-10-CM | POA: Diagnosis not present

## 2019-02-04 DIAGNOSIS — Z0100 Encounter for examination of eyes and vision without abnormal findings: Secondary | ICD-10-CM

## 2019-02-04 DIAGNOSIS — I10 Essential (primary) hypertension: Secondary | ICD-10-CM

## 2019-02-04 HISTORY — DX: Localized edema: R60.0

## 2019-02-04 LAB — HEMOGLOBIN A1C: Hgb A1c MFr Bld: 6.4 % (ref 4.6–6.5)

## 2019-02-04 LAB — COMPREHENSIVE METABOLIC PANEL
ALT: 6 U/L (ref 0–35)
AST: 12 U/L (ref 0–37)
Albumin: 3.8 g/dL (ref 3.5–5.2)
Alkaline Phosphatase: 60 U/L (ref 39–117)
BUN: 19 mg/dL (ref 6–23)
CO2: 34 mEq/L — ABNORMAL HIGH (ref 19–32)
Calcium: 9.4 mg/dL (ref 8.4–10.5)
Chloride: 100 mEq/L (ref 96–112)
Creatinine, Ser: 0.77 mg/dL (ref 0.40–1.20)
GFR: 87.97 mL/min (ref >60.00)
Glucose, Bld: 105 mg/dL — ABNORMAL HIGH (ref 70–99)
Potassium: 3.8 mEq/L (ref 3.5–5.1)
Sodium: 140 mEq/L (ref 135–145)
Total Bilirubin: 0.8 mg/dL (ref 0.2–1.2)
Total Protein: 6.9 g/dL (ref 6.0–8.3)

## 2019-02-04 LAB — CBC WITH DIFFERENTIAL/PLATELET
Basophils Absolute: 0.1 10*3/uL (ref 0.0–0.1)
Basophils Relative: 0.8 % (ref 0.0–3.0)
Eosinophils Absolute: 0.1 10*3/uL (ref 0.0–0.7)
Eosinophils Relative: 1.5 % (ref 0.0–5.0)
HCT: 37.1 % (ref 36.0–46.0)
Hemoglobin: 11.8 g/dL — ABNORMAL LOW (ref 12.0–15.0)
Lymphocytes Relative: 32.7 % (ref 12.0–46.0)
Lymphs Abs: 2.4 10*3/uL (ref 0.7–4.0)
MCHC: 31.9 g/dL (ref 30.0–36.0)
MCV: 80.8 fl (ref 78.0–100.0)
Monocytes Absolute: 0.4 10*3/uL (ref 0.1–1.0)
Monocytes Relative: 5.3 % (ref 3.0–12.0)
Neutro Abs: 4.5 10*3/uL (ref 1.4–7.7)
Neutrophils Relative %: 59.7 % (ref 43.0–77.0)
Platelets: 216 10*3/uL (ref 150.0–400.0)
RBC: 4.58 Mil/uL (ref 3.87–5.11)
RDW: 14.2 % (ref 11.5–15.5)
WBC: 7.5 10*3/uL (ref 4.0–10.5)

## 2019-02-04 LAB — LIPID PANEL
Cholesterol: 147 mg/dL (ref 0–200)
HDL: 54.5 mg/dL (ref >39.00)
LDL Cholesterol: 76 mg/dL (ref 0–99)
NonHDL: 92.56
Total CHOL/HDL Ratio: 3
Triglycerides: 85 mg/dL (ref 0.0–149.0)
VLDL: 17 mg/dL (ref 0.0–40.0)

## 2019-02-04 NOTE — Progress Notes (Signed)
Chief Complaint  Patient presents with  . Follow-up    hypertension   F/u  1. HTN controlled overall BP 136/82 today on norvasc 10 mg qd, coreg 25 mg bid, hyzaar 100-25 mg qd  2. Due for 2nd shingrix overdue and wants flu shot today  3. Falls x 1 w/in the last 1 year  4. DM 2 A1C 6.4 on metformin will check labs today and referred DM eye exam [facility]   Review of Systems  Constitutional: Negative for weight loss.  HENT: Negative for hearing loss.   Eyes: Negative for blurred vision.  Respiratory: Negative for shortness of breath.   Cardiovascular: Negative for chest pain.  Gastrointestinal: Negative for abdominal pain.  Musculoskeletal: Positive for falls.  Skin: Negative for rash.  Neurological: Negative for headaches.  Psychiatric/Behavioral: Negative for depression.   Past Medical History:  Diagnosis Date  . Arthritis   . Diabetes ([place]) 02/19/2015  . Diverticulosis 12/07/2016   Sigmoid Colon  . Diverticulosis   . Diverticulosis of sigmoid colon 12/08/2016  . Elevated blood sugar 01/15/2015  . GERD (gastroesophageal reflux disease)   . Hemorrhoids   . History of blood transfusion   . Hyperlipidemia   . Hypertension   . Hypertension 01/15/2015  . Obesity 01/15/2015  . OSA on CPAP   . Sleep apnea    Past Surgical History:  Procedure Laterality Date  . BOTOX INJECTION N/A 03/27/2018   Procedure: BOTOX INJECTION;  Surgeon: Jules Husbands, MD;  Location: [facility];  Service: General;  Laterality: N/A;  . COLONOSCOPY    . COLONOSCOPY WITH PROPOFOL N/A 12/07/2016   Procedure: COLONOSCOPY WITH PROPOFOL;  Surgeon: [name], MD;  Location: [place];  Service: Endoscopy;  Laterality: N/A;  Diabetic - oral meds  . EVALUATION UNDER ANESTHESIA WITH ANAL FISSUROTOMY N/A 03/27/2018   Procedure: EXAM UNDER ANESTHESIA, CHEMICAL SPHINCTEROTOMY;  Surgeon: Jules Husbands, MD;  Location: [facility];  Service: General;  Laterality: N/A;  . HEMORRHOID SURGERY N/A 06/13/2018    Procedure: [id number];  Surgeon: Jules Husbands, MD;  Location: [facility];  Service: General;  Laterality: N/A;  . JOINT REPLACEMENT     b/l hips in [place] 2004 and 2005 Dr. Alfonso Ramus   . POLYPECTOMY  12/07/2016   Procedure: POLYPECTOMY;  Surgeon: [name], MD;  Location: [place];  Service: Endoscopy;;  . RECTAL EXAM UNDER ANESTHESIA N/A 06/13/2018   Procedure: RECTAL EXAM UNDER ANESTHESIA;  Surgeon: Jules Husbands, MD;  Location: [facility];  Service: General;  Laterality: N/A;  . TOTAL HIP ARTHROPLASTY Bilateral   . TUBAL LIGATION     Family History  Problem Relation Age of Onset  . Dementia Mother   . Hypertension Mother   . Arthritis Mother   . Alcohol abuse Father   . Cirrhosis Father   . Cancer Daughter        breast  . COPD Brother   . Breast cancer Sister 36  . Cancer Sister        ? type   . Cancer Brother        ?type  . Cancer Brother        cancer ?type  . Cancer Daughter        cancer ?type died 53    Social History   Socioeconomic History  . Marital status: Divorced    Spouse name: Not on file  . Number of children: Not on file  . Years of education: Not on  file  . Highest education level: Not on file  Occupational History  . 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 Financial resource strain: Not on file  . Food insecurity    Worry: Not on file    Inability: Not on file  . Transportation needs    Medical: Not on file    Non-medical: Not on file  Tobacco Use  . Smoking status: Never Smoker  . Smokeless tobacco: Never Used  Substance and Sexual Activity  . Alcohol use: No    Alcohol/week: 0.0 standard drinks  . Drug use: No  . Sexual activity: Not Currently  Lifestyle  . Physical activity    Days per week: Not on file    Minutes per session: Not on file  . Stress: Not on file  Relationships  . Social Herbalist on phone: Not on file    Gets together: Not on  file    Attends religious service: Not on file    Active member of club or organization: Not on file    Attends meetings of clubs or organizations: Not on file    Relationship status: Not on file  . Intimate partner violence    Fear of current or ex partner: Not on file    Emotionally abused: Not on file    Physically abused: Not on file    Forced sexual activity: Not on file  Other Topics Concern  . Not on file  Social History Narrative   Lives with daughter [name] to go to church    Moved from [place] 3-4 years ago    No guns, wears seat belt    Never smoker    Current Meds  Medication Sig  . amLODipine (NORVASC) 10 MG tablet Take 1 tablet (10 mg total) by mouth daily.  Marland Kitchen atorvastatin (LIPITOR) 10 MG tablet Take 1 tablet (10 mg total) by mouth daily at 6 PM.  . Calcium Carb-Cholecalciferol (CALCIUM 600+D) 600-800 MG-UNIT TABS Take 1 tablet by mouth 2 (two) times daily.  . carvedilol (COREG) 25 MG tablet Take 1 tablet (25 mg total) by mouth 2 (two) times daily with a meal.  . diclofenac sodium (VOLTAREN) 1 % GEL Apply 2 g topically 4 (four) times daily.  Noelle Penner FIBER SUPPLEMENT PO Take 1 Dose by mouth daily.   Marland Kitchen losartan-hydrochlorothiazide (HYZAAR) 100-25 MG tablet Take 1 tablet by mouth daily. In am  . metFORMIN (GLUCOPHAGE) 500 MG tablet 1 pill daily in the am with food  . Omega-3 Fatty Acids (FISH OIL) 1000 MG CAPS Take 1,000 mg by mouth 2 (two) times daily.   Marland Kitchen omeprazole (PRILOSEC) 20 MG capsule Take 1 capsule (20 mg total) by mouth daily.  Marland Kitchen UNABLE TO FIND Med Name: 0.3% Nifedipine + 1.5% Lidocaine in [facility]- Place on Anal area TID. (Patient taking differently: Place 1 application rectally 3 (three) times daily. Med Name: 0.3% Nifedipine + 1.5% Lidocaine in [facility]- Place on Anal area TID.)   No Known Allergies Recent Results (from the past 2160 hour(s))  Novel Coronavirus, NAA (hospital order; send-out to ref lab)     Status: None   Collection  Time: 11/29/18  1:06 PM   Specimen: Nasopharyngeal Swab; Respiratory  Result Value Ref Range   SARS-CoV-2, NAA NOT DETECTED NOT DETECTED    Comment: (NOTE) This test was developed and its performance characteristics determined by [employer]. This  test has not been FDA cleared or approved. This test has been authorized by FDA under an Emergency Use Authorization (EUA). This test is only authorized for the duration of time the declaration that circumstances exist justifying the authorization of the emergency use of in vitro diagnostic tests for detection of SARS-CoV-2 virus and/or diagnosis of COVID-19 infection under section 564(b)(1) of the Act, 21 U.S.C. KA:123727), unless the authorization is terminated or revoked sooner. When diagnostic testing is negative, the possibility of a false negative result should be considered in the context of a patient's recent exposures and the presence of clinical signs and symptoms consistent with COVID-19. An individual without symptoms of COVID-19 and who is not shedding SARS-CoV-2 virus would expect to have a negative (not detected) result in this assay. Performed  At: [facility] [place], [place] [id number] Rush Farmer MD [id number]    Coronavirus Source NASOPHARYNGEAL     Comment: Performed at [facility], [address] [place], [place] [address]   Objective  Body mass index is 43.19 kg/m. Wt Readings from Last 3 Encounters:  02/04/19 251 lb 9.6 oz (114.1 kg)  12/09/18 252 lb 6.4 oz (114.5 kg)  07/17/18 254 lb 3.2 oz (115.3 kg)   Temp Readings from Last 3 Encounters:  02/04/19 97.7 F (36.5 C) (Temporal)  12/09/18 98.4 F (36.9 C) (Oral)  07/17/18 (!) 97.3 F (36.3 C) (Temporal)   BP Readings from Last 3 Encounters:  02/04/19 136/82  12/09/18 136/78  07/17/18 135/69   Pulse Readings from Last 3 Encounters:  02/04/19 60  12/09/18 61  07/17/18 61    Physical  Exam Vitals signs and nursing note reviewed.  Constitutional:      Appearance: Normal appearance. She is well-developed and well-groomed. She is obese.  HENT:     Head: Normocephalic and atraumatic.     Comments: +mask on   Cardiovascular:     Rate and Rhythm: Normal rate and regular rhythm.     Heart sounds: Normal heart sounds.     Comments: 1-2 + edema b/l leg edema  Pulmonary:     Effort: Pulmonary effort is normal.     Breath sounds: Normal breath sounds.  Musculoskeletal:     Right lower leg: 1+ Edema present.     Left lower leg: 1+ Edema present.  Skin:    General: Skin is warm and dry.  Neurological:     General: No focal deficit present.     Mental Status: She is alert and oriented to person, place, and time. Mental status is at baseline.     Gait: Gait normal.  Psychiatric:        Attention and Perception: Attention and perception normal.        Mood and Affect: Mood and affect normal.        Speech: Speech normal.        Behavior: Behavior normal. Behavior is cooperative.        Thought Content: Thought content normal.        Cognition and Memory: Cognition and memory normal.        Judgment: Judgment normal.     Assessment  Plan  Essential hypertension - Plan: sch fasting labs  Cont meds Consider add hydralazine bid prn low dose in future   Diabetic eye exam ([place]) - Plan: Ambulatory referral to [facility]hthal[facility] [facility]   Type 2 diabetes mellitus without complication, without long-term current use of insulin ([place]) - Plan: Comprehensive metabolic  panel, Lipid panel, Hemoglobin A1c -cont meds   Leg edema  -consider echo of heart pt wants to wait for now  Flu shotgiven today  Tdap had 07/04/2018  pna 23 had 10/12/15will pna 23 06/2019  prevnar utd Had zostervax, shingrix had 1/2 07/04/2018   Mammogram  3/2/2020negative Out of age window pap  Colonoscopy had 12/07/16 IH, diverticulosis and tubular adenoma f/u in 5 years Dr. Allen Norris  DEXA get report  prior [facility]. Qu[facility] had 5 years ago -consider repeat no record   OSA f/u pulm, pfts sch 11/12/2018 and f/u pulm 11/19/2018  Provider: Dr. Olivia Mackie McLean-Scocuzza-Internal Medicine

## 2019-02-04 NOTE — Patient Instructions (Signed)
Please call pharmacy and get 2nd shingles shot asap but wait 03/06/2019

## 2019-03-28 LAB — HM DIABETES EYE EXAM

## 2019-04-07 ENCOUNTER — Encounter: Payer: Self-pay | Admitting: Internal Medicine

## 2019-05-06 ENCOUNTER — Ambulatory Visit (INDEPENDENT_AMBULATORY_CARE_PROVIDER_SITE_OTHER): Payer: Medicare HMO

## 2019-05-06 ENCOUNTER — Other Ambulatory Visit: Payer: Self-pay

## 2019-05-06 DIAGNOSIS — Z0000 Encounter for general adult medical examination without abnormal findings: Secondary | ICD-10-CM

## 2019-05-06 NOTE — Progress Notes (Signed)
Subjective:   Taylor Shaw is a 78 y.o. female who presents for an Initial Medicare Annual Wellness Visit.  Review of Systems    No ROS.  Medicare Wellness Virtual Visit.  Visual/audio telehealth visit, UTA vital signs.   See social history for additional risk factors.    Cardiac Risk Factors include: advanced age (>85men, >35 women);hypertension;diabetes mellitus     Objective:    Today's Vitals   There is no height or weight on file to calculate BMI.  Advanced Directives 05/06/2019 06/12/2018 03/27/2018 03/25/2018 12/07/2016 09/14/2016 03/27/2015  Does Patient Have a Medical Advance Directive? Yes Yes No No Yes No No  Type of Paramedic of [place];Living will Living will;Healthcare Power of Delight - -  Does patient want to make changes to medical advance directive? No - Patient declined No - Patient declined - - No - Patient declined - -  Copy of [place] in Chart? No - copy requested No - copy requested - - No - copy requested - -  Would patient like information on creating a medical advance directive? - - - No - Patient declined - No - Patient declined No - patient declined information    Current Medications (verified) Outpatient Encounter Medications as of 05/06/2019  Medication Sig  . amLODipine (NORVASC) 10 MG tablet Take 1 tablet (10 mg total) by mouth daily.  Marland Kitchen atorvastatin (LIPITOR) 10 MG tablet Take 1 tablet (10 mg total) by mouth daily at 6 PM.  . Calcium Carb-Cholecalciferol (CALCIUM 600+D) 600-800 MG-UNIT TABS Take 1 tablet by mouth 2 (two) times daily.  . carvedilol (COREG) 25 MG tablet Take 1 tablet (25 mg total) by mouth 2 (two) times daily with a meal.  . diclofenac sodium (VOLTAREN) 1 % GEL Apply 2 g topically 4 (four) times daily.  Noelle Penner FIBER SUPPLEMENT PO Take 1 Dose by mouth daily.   Marland Kitchen losartan-hydrochlorothiazide (HYZAAR) 100-25 MG tablet Take 1 tablet by mouth daily. In am  .  metFORMIN (GLUCOPHAGE) 500 MG tablet 1 pill daily in the am with food  . Omega-3 Fatty Acids (FISH OIL) 1000 MG CAPS Take 1,000 mg by mouth 2 (two) times daily.   Marland Kitchen omeprazole (PRILOSEC) 20 MG capsule Take 1 capsule (20 mg total) by mouth daily.  Marland Kitchen UNABLE TO FIND Med Name: 0.3% Nifedipine + 1.5% Lidocaine in [facility]- Place on Anal area TID. (Patient taking differently: Place 1 application rectally 3 (three) times daily. Med Name: 0.3% Nifedipine + 1.5% Lidocaine in [facility]- Place on Anal area TID.)   No facility-administered encounter medications on file as of 05/06/2019.     Allergies (verified) Patient has no known allergies.   History: Past Medical History:  Diagnosis Date  . Arthritis   . Diabetes ([place]) 02/19/2015  . Diverticulosis 12/07/2016   Sigmoid Colon  . Diverticulosis   . Diverticulosis of sigmoid colon 12/08/2016  . Elevated blood sugar 01/15/2015  . GERD (gastroesophageal reflux disease)   . Hemorrhoids   . History of blood transfusion   . Hyperlipidemia   . Hypertension   . Hypertension 01/15/2015  . Obesity 01/15/2015  . OSA on CPAP   . Sleep apnea    Past Surgical History:  Procedure Laterality Date  . BOTOX INJECTION N/A 03/27/2018   Procedure: BOTOX INJECTION;  Surgeon: Jules Husbands, MD;  Location: [facility];  Service: General;  Laterality: N/A;  . COLONOSCOPY    .  COLONOSCOPY WITH PROPOFOL N/A 12/07/2016   Procedure: COLONOSCOPY WITH PROPOFOL;  Surgeon: [name], MD;  Location: [place];  Service: Endoscopy;  Laterality: N/A;  Diabetic - oral meds  . EVALUATION UNDER ANESTHESIA WITH ANAL FISSUROTOMY N/A 03/27/2018   Procedure: EXAM UNDER ANESTHESIA, CHEMICAL SPHINCTEROTOMY;  Surgeon: Jules Husbands, MD;  Location: [facility];  Service: General;  Laterality: N/A;  . HEMORRHOID SURGERY N/A 06/13/2018   Procedure: [id number];  Surgeon: Jules Husbands, MD;  Location: [facility];  Service: General;  Laterality: N/A;  . JOINT REPLACEMENT      b/l hips in [place] 2004 and 2005 Dr. Alfonso Ramus   . POLYPECTOMY  12/07/2016   Procedure: POLYPECTOMY;  Surgeon: [name], MD;  Location: [facility];  Service: Endoscopy;;  . RECTAL EXAM UNDER ANESTHESIA N/A 06/13/2018   Procedure: RECTAL EXAM UNDER ANESTHESIA;  Surgeon: Jules Husbands, MD;  Location: [facility];  Service: General;  Laterality: N/A;  . TOTAL HIP ARTHROPLASTY Bilateral   . TUBAL LIGATION     Family History  Problem Relation Age of Onset  . Dementia Mother   . Hypertension Mother   . Arthritis Mother   . Alcohol abuse Father   . Cirrhosis Father   . Cancer Daughter        breast  . COPD Brother   . Breast cancer Sister 40  . Cancer Sister        ? type   . Dementia Sister   . Cancer Brother        ?type  . Cancer Brother        cancer ?type  . Cancer Daughter        cancer ?type died 33   . Alzheimer's disease Sister    Social History   Socioeconomic History  . Marital status: Divorced    Spouse name: Not on file  . Number of children: Not on file  . Years of education: Not on file  . Highest education level: Not on file  Occupational History  . 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 Financial resource strain: Not hard at all  . Food insecurity    Worry: Never true    Inability: Never true  . Transportation needs    Medical: No    Non-medical: No  Tobacco Use  . Smoking status: Never Smoker  . Smokeless tobacco: Never Used  Substance and Sexual Activity  . Alcohol use: No    Alcohol/week: 0.0 standard drinks  . Drug use: No  . Sexual activity: Not Currently  Lifestyle  . Physical activity    Days per week: Not on file    Minutes per session: Not on file  . Stress: Not at all  Relationships  . Social Herbalist on phone: Not on file    Gets together: Not on file    Attends religious service: Not on file    Active member of club or organization: Not on file     Attends meetings of clubs or organizations: Not on file    Relationship status: Not on file  Other Topics Concern  . Not on file  Social History Narrative   Lives with daughter [name] to go to church    Moved from [place] 3-4 years ago    No guns, wears seat belt    Never smoker     Tobacco Counseling  Counseling given: Not Answered   Clinical Intake:  Pre-visit preparation completed: Yes        Diabetes: Yes(Followed by pcp)  How often do you need to have someone help you when you read instructions, pamphlets, or other written materials from your doctor or pharmacy?: 1 - Never  Interpreter Needed?: No      Activities of Daily Living In your present state of health, do you have any difficulty performing the following activities: 05/06/2019 06/13/2018  Hearing? N N  Vision? N N  Difficulty concentrating or making decisions? N N  Walking or climbing stairs? Y Y  Comment Unsteady gait. Cane/walker as needed. -  Dressing or bathing? N N  Doing errands, shopping? Y -  Comment She drives but does not travel alone -  Conservation officer, nature and eating ? N -  Using the Toilet? N -  In the past six months, have you accidently leaked urine? N -  Do you have problems with loss of bowel control? N -  Managing your Medications? N -  Managing your Finances? N -  Housekeeping or managing your Housekeeping? N -  Some recent data might be hidden     Immunizations and Health Maintenance Immunization History  Administered Date(s) Administered  . Fluad Quad(high Dose 65+) 02/04/2019  . Influenza, High Dose Seasonal PF 02/19/2015, 04/03/2018  . Influenza-Unspecified 03/16/2014, 02/27/2017  . Pneumococcal Conjugate-13 07/04/2018  . Pneumococcal-Unspecified 03/16/2014  . Tdap 07/04/2018, 03/28/2019  . Zoster 04/16/2013  . Zoster Recombinat (Shingrix) 07/04/2018, 03/10/2019, 03/28/2019   Health Maintenance Due  Topic Date Due  . FOOT EXAM  03/07/1951  . DEXA SCAN   03/08/2007    Patient Care Team: McLean-Scocuzza, Nino Glow, MD as PCP - General (Internal Medicine)  Indicate any recent Medical Services you may have received from other than [facility] providers in the past year (date may be approximate).     Assessment:   This is a routine wellness examination for Olive.  Nurse connected with patient 05/06/19 at 11:00 AM EST by a telephone enabled telemedicine application and verified that I am speaking with the correct person using two identifiers. Patient stated full name and DOB. Patient gave permission to continue with virtual visit. Patient's location was at home and Nurse's location was at [place] office.   Health Maintenance Due: -Dexa Scan- deferred per patient request -Foot Exam- followed by pcp -Hgb A1c- 02/04/19 (6.4) Update all pending maintenance due as appropriate.   See completed HM at the end of note.   Eye: Visual acuity not assessed. Virtual visit. Wears corrective lenses. Followed by their ophthalmologist every 12 months.  Retinopathy- none reported  Dental: Visits every 12 months.    Hearing: Demonstrates normal hearing during visit.  Safety:  Patient feels safe at home- yes Patient does have smoke detectors at home- yes Patient does wear sunscreen or protective clothing when in direct sunlight - yes Patient does wear seat belt when in a moving vehicle - yes Patient drives- yes Adequate lighting in walkways free from debris- yes Grab bars and handrails used as appropriate- yes Ambulates with no assistive device Cell phone on person when ambulating outside of the home- yes  Social: Alcohol intake - no     Smoking history- never  Smokers in home? none Illicit drug use? none  Depression: PHQ 2 &9 complete. See screening below. Denies irritability, anhedonia, sadness/tearfullness.    Falls: See screening below.    Medication: Taking as directed and without issues.   Covid-19:  Precautions and sickness symptoms  discussed. Wears mask, social distancing, hand hygiene as appropriate.   Activities of Daily Living Patient denies needing assistance with: household chores, feeding themselves, getting from bed to chair, getting to the toilet, bathing/showering, dressing, managing money, or preparing meals.   Memory: Patient is alert. Patient denies difficulty focusing or concentrating. Correctly identified the president of the [place], season and recall. Patient likes to sew and complete puzzles for brain stimulation.   BMI- discussed the importance of a healthy diet, water intake and the benefits of aerobic exercise.  Educational material provided.  Physical activity- walking the breezeway every other day about 10 minutes.  Diet:  Lean meats, vegetables  Water: good intake Caffeine: a cup of coffee/tea every once in awhile  Other Providers Patient Care Team: McLean-Scocuzza, Nino Glow, MD as PCP - General (Internal Medicine) Hearing/Vision screen  Hearing Screening   125Hz  250Hz  500Hz  1000Hz  2000Hz  3000Hz  4000Hz  6000Hz  8000Hz   Right ear:           Left ear:           Comments: Patient is able to hear conversational tones without difficulty.  No issues reported.  Vision Screening Comments: Wears corrective lenses Visual acuity not assessed, virtual visit.  They have seen their ophthalmologist in the last 12 months.     Dietary issues and exercise activities discussed: Current Exercise Habits: Home exercise routine, Type of exercise: walking, Time (Minutes): 20, Frequency (Times/Week): 2, Weekly Exercise (Minutes/Week): 40, Intensity: Mild  Goals    . Maintain Healthy Lifestyle     Stay active Stay hydrated Eat healthy      Depression Screen PHQ 2/9 Scores 05/06/2019 04/03/2018 02/19/2015 01/15/2015  PHQ - 2 Score 0 0 0 0    Fall Risk Fall Risk  05/06/2019 02/04/2019 07/17/2018 06/26/2018 06/10/2018  Falls in the past year? 0 1 0 0 0  Number falls in past yr: - 0 - 0 0  Injury with Fall? - 0 -  0 0  Follow up Education provided;Falls prevention discussed - - - -   Timed Get Up and Go Performed no, virtual visit  Cognitive Function:     6CIT Screen 05/06/2019  What Year? 0 points  What month? 0 points  What time? 0 points  Count back from 20 0 points  Months in reverse 0 points  Repeat phrase 2 points  Total Score 2    Screening Tests Health Maintenance  Topic Date Due  . FOOT EXAM  03/07/1951  . DEXA SCAN  03/08/2007  . HEMOGLOBIN A1C  08/04/2019  . OPHTHALMOLOGY EXAM  03/27/2020  . TETANUS/TDAP  07/04/2028  . INFLUENZA VACCINE  Completed  . PNA vac Low Risk Adult  Completed     Plan:   Keep all routine maintenance appointments.   Follow up 06/13/19 @ 1030  Medicare Attestation I have personally reviewed: The patient's medical and social history Their use of alcohol, tobacco or illicit drugs Their current medications and supplements The patient's functional ability including ADLs,fall risks, home safety risks, cognitive, and hearing and visual impairment Diet and physical activities Evidence for depression   In addition, I have reviewed and discussed with patient certain preventive protocols, quality metrics, and best practice recommendations. A written personalized care plan for preventive services as well as general preventive health recommendations were provided to patient via mail.     Varney Biles, LPN   [id number]

## 2019-05-06 NOTE — Patient Instructions (Addendum)
  Ms. Taylor Shaw , Thank you for taking time to come for your Medicare Wellness Visit. I appreciate your ongoing commitment to your health goals. Please review the following plan we discussed and let me know if I can assist you in the future.   These are the goals we discussed: Goals    . Maintain Healthy Lifestyle     Stay active Stay hydrated Eat healthy       This is a list of the screening recommended for you and due dates:  Health Maintenance  Topic Date Due  . Complete foot exam   03/07/1951  . DEXA scan (bone density measurement)  03/08/2007  . Hemoglobin A1C  08/04/2019  . Eye exam for diabetics  03/27/2020  . Tetanus Vaccine  07/04/2028  . Flu Shot  Completed  . Pneumonia vaccines  Completed

## 2019-06-13 ENCOUNTER — Other Ambulatory Visit: Payer: Self-pay

## 2019-06-13 ENCOUNTER — Ambulatory Visit (INDEPENDENT_AMBULATORY_CARE_PROVIDER_SITE_OTHER): Payer: Medicare HMO | Admitting: Internal Medicine

## 2019-06-13 VITALS — BP 143/74 | Ht 65.0 in | Wt 250.0 lb

## 2019-06-13 DIAGNOSIS — I10 Essential (primary) hypertension: Secondary | ICD-10-CM | POA: Diagnosis not present

## 2019-06-13 DIAGNOSIS — E119 Type 2 diabetes mellitus without complications: Secondary | ICD-10-CM

## 2019-06-13 DIAGNOSIS — Z1231 Encounter for screening mammogram for malignant neoplasm of breast: Secondary | ICD-10-CM

## 2019-06-13 DIAGNOSIS — Z1329 Encounter for screening for other suspected endocrine disorder: Secondary | ICD-10-CM

## 2019-06-13 MED ORDER — HYDRALAZINE HCL 10 MG PO TABS: 10.0000 mg | ORAL_TABLET | Freq: Two times a day (BID) | ORAL | 3 refills | Status: DC | PRN

## 2019-06-13 NOTE — Progress Notes (Signed)
Telephone Note  I connected with Taylor Shaw  on 06/13/19 at 10:50 AM EST by telephone and verified that I am speaking with the correct person using two identifiers.  Location patient: home Location provider:work or home office Persons participating in the virtual visit: patient, provider  I discussed the limitations of evaluation and management by telemedicine and the availability of in person appointments. The patient expressed understanding and agreed to proceed.   HPI: 1. HTN BP 143/74 on hyzaar 100/25, coreg 25 mg bid, norvasc 10 mg qd, pt reports compliance but BP at times elevated  2. DM 2 A1c controlled on metformin 500 A1C 6.4 no issues with feet per pt today  3. Has ?s about covid 19 vaccine and if she should get for her age    ROS: See pertinent positives and negatives per HPI.  Past Medical History:  Diagnosis Date  . Arthritis   . Diabetes ([place]) 02/19/2015  . Diverticulosis 12/07/2016   Sigmoid Colon  . Diverticulosis   . Diverticulosis of sigmoid colon 12/08/2016  . Elevated blood sugar 01/15/2015  . GERD (gastroesophageal reflux disease)   . Hemorrhoids   . History of blood transfusion   . Hyperlipidemia   . Hypertension   . Hypertension 01/15/2015  . Obesity 01/15/2015  . OSA on CPAP   . Sleep apnea     Past Surgical History:  Procedure Laterality Date  . BOTOX INJECTION N/A 03/27/2018   Procedure: BOTOX INJECTION;  Surgeon: Jules Husbands, MD;  Location: [facility];  Service: General;  Laterality: N/A;  . COLONOSCOPY    . COLONOSCOPY WITH PROPOFOL N/A 12/07/2016   Procedure: COLONOSCOPY WITH PROPOFOL;  Surgeon: [name], MD;  Location: [facility];  Service: Endoscopy;  Laterality: N/A;  Diabetic - oral meds  . EVALUATION UNDER ANESTHESIA WITH ANAL FISSUROTOMY N/A 03/27/2018   Procedure: EXAM UNDER ANESTHESIA, CHEMICAL SPHINCTEROTOMY;  Surgeon: Jules Husbands, MD;  Location: [facility];  Service: General;  Laterality: N/A;  . HEMORRHOID SURGERY N/A  06/13/2018   Procedure: [id number];  Surgeon: Jules Husbands, MD;  Location: [facility];  Service: General;  Laterality: N/A;  . JOINT REPLACEMENT     b/l hips in [place] 2004 and 2005 Dr. Alfonso Ramus   . POLYPECTOMY  12/07/2016   Procedure: POLYPECTOMY;  Surgeon: [name], MD;  Location: [facility];  Service: Endoscopy;;  . RECTAL EXAM UNDER ANESTHESIA N/A 06/13/2018   Procedure: RECTAL EXAM UNDER ANESTHESIA;  Surgeon: Jules Husbands, MD;  Location: [facility];  Service: General;  Laterality: N/A;  . TOTAL HIP ARTHROPLASTY Bilateral   . TUBAL LIGATION      Family History  Problem Relation Age of Onset  . Dementia Mother   . Hypertension Mother   . Arthritis Mother   . Alcohol abuse Father   . Cirrhosis Father   . Cancer Daughter        breast  . COPD Brother   . Breast cancer Sister 61  . Cancer Sister        ? type   . Dementia Sister   . Cancer Brother        ?type  . Cancer Brother        cancer ?type  . Cancer Daughter        cancer ?type died 24   . Alzheimer's disease Sister     SOCIAL HX:  Lives with daughter Taylor Shaw to go to church  Moved from [place] 3-4  years ago  No guns, wears seat belt  Never smoker    Current Outpatient Medications:  .  amLODipine (NORVASC) 10 MG tablet, Take 1 tablet (10 mg total) by mouth daily., Disp: 90 tablet, Rfl: 3 .  atorvastatin (LIPITOR) 10 MG tablet, Take 1 tablet (10 mg total) by mouth daily at 6 PM., Disp: 90 tablet, Rfl: 3 .  Calcium Carb-Cholecalciferol (CALCIUM 600+D) 600-800 MG-UNIT TABS, Take 1 tablet by mouth 2 (two) times daily., Disp: , Rfl:  .  carvedilol (COREG) 25 MG tablet, Take 1 tablet (25 mg total) by mouth 2 (two) times daily with a meal., Disp: 180 tablet, Rfl: 3 .  diclofenac sodium (VOLTAREN) 1 % GEL, Apply 2 g topically 4 (four) times daily., Disp: 100 g, Rfl: 11 .  EQ FIBER SUPPLEMENT PO, Take 1 Dose by mouth daily. , Disp: , Rfl:  .  losartan-hydrochlorothiazide (HYZAAR) 100-25 MG  tablet, Take 1 tablet by mouth daily. In am, Disp: 90 tablet, Rfl: 3 .  metFORMIN (GLUCOPHAGE) 500 MG tablet, 1 pill daily in the am with food, Disp: 90 tablet, Rfl: 3 .  Omega-3 Fatty Acids (FISH OIL) 1000 MG CAPS, Take 1,000 mg by mouth 2 (two) times daily. , Disp: , Rfl:  .  omeprazole (PRILOSEC) 20 MG capsule, Take 1 capsule (20 mg total) by mouth daily., Disp: 90 capsule, Rfl: 3 .  UNABLE TO FIND, Med Name: 0.3% Nifedipine + 1.5% Lidocaine in [facility]- Place on Anal area TID. (Patient taking differently: Place 1 application rectally 3 (three) times daily. Med Name: 0.3% Nifedipine + 1.5% Lidocaine in [employer]- Place on Anal area TID.), Disp: 30 g, Rfl: 1 .  hydrALAZINE (APRESOLINE) 10 MG tablet, Take 1 tablet (10 mg total) by mouth 2 (two) times daily as needed. Take if BP >140/>80, Disp: 180 tablet, Rfl: 3  EXAM:  VITALS per patient if applicable:  GENERAL: alert, oriented, appears well and in no acute distress  PSYCH/NEURO: pleasant and cooperative, no obvious depression or anxiety, speech and thought processing grossly intact  ASSESSMENT AND PLAN:  Discussed the following assessment and plan:  Essential hypertension - Plan: hydrALAZINE (APRESOLINE) 10 MG tablet bid if BP >140/>80 goal 130-<140/<80  Comprehensive metabolic panel, CBC with Differential/Platelet, Lipid panel, Urinalysis, Routine w reflex microscopic   Type 2 diabetes mellitus without complication, without long-term current use of insulin (HCC) - Plan: Comprehensive metabolic panel, CBC with Differential/Platelet, Lipid panel, Urinalysis, Routine w reflex microscopic, HgB A1c, Microalbumin / creatinine urine ratio Cont meds   HM Flu shotutd Tdap had 07/04/2018 pna 23 had 10/12/15will pna 23 06/2019  prevnarutd Had zostervax, shingrix had 2/2 Disc covid 19 vx today  Mammogram 3/2/2020negative, ordered  Out of age window pap  Colonoscopy had 12/07/16 IH, diverticulosis and tubular adenoma f/u in 5  years Dr. Allen Norris  DEXA get report prior [facility]CP Dr. Quillian [facility] per pt had 5 years ago -consider repeatno record   OSA f/u pulm, pfts sch 11/12/2018 and f/u pulm 11/19/2018  -we discussed possible serious and likely etiologies, options for evaluation and workup, limitations of telemedicine visit vs in person visit, treatment, treatment risks and precautions. Pt prefers to treat via telemedicine empirically rather then risking or undertaking an in person visit at this moment. Patient agrees to seek prompt in person care if worsening, new symptoms arise, or if is not improving with treatment.   I discussed the assessment and treatment plan with the patient. The patient was provided an opportunity  to ask questions and all were answered. The patient agreed with the plan and demonstrated an understanding of the instructions.   The patient was advised to call back or seek an in-person evaluation if the symptoms worsen or if the condition fails to improve as anticipated.  Time spent 20 min Delorise Jackson, MD

## 2019-06-20 ENCOUNTER — Ambulatory Visit (INDEPENDENT_AMBULATORY_CARE_PROVIDER_SITE_OTHER): Payer: Medicare HMO | Admitting: Adult Health

## 2019-06-20 ENCOUNTER — Encounter: Payer: Self-pay | Admitting: Adult Health

## 2019-06-20 DIAGNOSIS — G4733 Obstructive sleep apnea (adult) (pediatric): Secondary | ICD-10-CM

## 2019-06-20 DIAGNOSIS — Z6841 Body Mass Index (BMI) 40.0 and over, adult: Secondary | ICD-10-CM

## 2019-06-20 NOTE — Progress Notes (Signed)
Virtual Visit via Telephone Note  I connected with Taylor Shaw on 06/20/19 at  9:30 AM EST by telephone and verified that I am speaking with the correct person using two identifiers.  Location: Patient: Home  Provider: Office    I discussed the limitations, risks, security and privacy concerns of performing an evaluation and management service by telephone and the availability of in person appointments. I also discussed with the patient that there may be a patient responsible charge related to this service. The patient expressed understanding and agreed to proceed.   History of Present Illness: 79 year old female followed for obstructive sleep apnea on nocturnal CPAP  Today's televisit is a 33-month follow-up for sleep apnea.  Patient says she is doing very well on CPAP at bedtime.  She wears it from 9 to 10 hours each night.  She never misses a night.  She says she feels more rested has a more peaceful and restful sleep and wakes up feeling less sleepy.  She uses a fullface mask.  Says she is having no issues with her mask.  She says her homecare company is working well and get supplies every 3 months.     Observations/Objective:  CPAP download data 01/10/2018-04/09/2018.>>  Raw data personally reviewed uses greater than 4 hours is 86/90 days.  Average usage on days used 7 hours 20 minutes.  Set pressure is 16 CPAP.  Leaks are minimal.  Residual AHI 0.6.  Overall this shows very good compliance with CPAP with excellent control of obstructive sleep apnea. **CPAP download 09/11/2018- 12/09/2018>> raw data personally reviewed.  Usage greater than 4 hours 85/90 days.  Average usage on days used is 8 hours 31 minutes.  Set pressure 16, leaks are within normal limits, residual AHI 0.4.  Overall this shows very good compliance with CPAP with excellent control obstructive sleep apnea.  Review of outside sleep studies and outside records including office visit notes from neuro and sleep clinic of Taylor Shaw received and reviewed, 32 minutes spent, summarized below. **CPAP titration study 01/25/2011 CPAP titrated to 16. **Baseline PSG 12/14/2010: Severe obstructive sleep apnea with AHI of 39.  CPAP titration was recommended.  Assessment and Plan: Severe obstructive sleep apnea with good control on nocturnal CPAP.  CPAP download was requested.  Morbid obesity-healthy weight loss discussed  Plan  Patient Instructions  Continue on CPAP At bedtime   Keep up good work  CPAP download -Please take CPAP SD card to DME company to have downloaded.  Activity as tolerated.  Do not drive if sleepy  Work on [employer] .  Covid Vaccine when available  Follow up with Dr. Mortimer Shaw in 1 year and As needed       Follow Up Instructions: Follow-up in 1 year and as needed CPAP download is pending   I discussed the assessment and treatment plan with the patient. The patient was provided an opportunity to ask questions and all were answered. The patient agreed with the plan and demonstrated an understanding of the instructions.   The patient was advised to call back or seek an in-person evaluation if the symptoms worsen or if the condition fails to improve as anticipated.  I provided 21  minutes of non-face-to-face time during this encounter.   Rexene Edison, NP

## 2019-06-20 NOTE — Patient Instructions (Signed)
Continue on CPAP At bedtime   Keep up good work  CPAP download -Please take CPAP SD card to DME company to have downloaded.  Activity as tolerated.  Do not drive if sleepy  Work on [employer] .  Covid Vaccine when available  Follow up with Dr. Mortimer Fries in 1 year and As needed

## 2019-07-01 ENCOUNTER — Encounter: Payer: Self-pay | Admitting: Internal Medicine

## 2019-08-04 ENCOUNTER — Other Ambulatory Visit: Payer: Self-pay | Admitting: Internal Medicine

## 2019-08-04 ENCOUNTER — Other Ambulatory Visit (INDEPENDENT_AMBULATORY_CARE_PROVIDER_SITE_OTHER): Payer: Medicare HMO

## 2019-08-04 ENCOUNTER — Other Ambulatory Visit: Payer: Self-pay

## 2019-08-04 DIAGNOSIS — R7989 Other specified abnormal findings of blood chemistry: Secondary | ICD-10-CM

## 2019-08-04 DIAGNOSIS — I10 Essential (primary) hypertension: Secondary | ICD-10-CM

## 2019-08-04 DIAGNOSIS — Z1329 Encounter for screening for other suspected endocrine disorder: Secondary | ICD-10-CM | POA: Diagnosis not present

## 2019-08-04 DIAGNOSIS — E049 Nontoxic goiter, unspecified: Secondary | ICD-10-CM

## 2019-08-04 DIAGNOSIS — E119 Type 2 diabetes mellitus without complications: Secondary | ICD-10-CM

## 2019-08-04 LAB — CBC WITH DIFFERENTIAL/PLATELET
Basophils Absolute: 0.1 10*3/uL (ref 0.0–0.1)
Basophils Relative: 1 % (ref 0.0–3.0)
Eosinophils Absolute: 0.4 10*3/uL (ref 0.0–0.7)
Eosinophils Relative: 4.9 % (ref 0.0–5.0)
HCT: 35.5 % — ABNORMAL LOW (ref 36.0–46.0)
Hemoglobin: 11.5 g/dL — ABNORMAL LOW (ref 12.0–15.0)
Lymphocytes Relative: 39.6 % (ref 12.0–46.0)
Lymphs Abs: 2.9 10*3/uL (ref 0.7–4.0)
MCHC: 32.4 g/dL (ref 30.0–36.0)
MCV: 80.5 fl (ref 78.0–100.0)
Monocytes Absolute: 0.7 10*3/uL (ref 0.1–1.0)
Monocytes Relative: 9.8 % (ref 3.0–12.0)
Neutro Abs: 3.3 10*3/uL (ref 1.4–7.7)
Neutrophils Relative %: 44.7 % (ref 43.0–77.0)
Platelets: 208 10*3/uL (ref 150.0–400.0)
RBC: 4.41 Mil/uL (ref 3.87–5.11)
RDW: 14.2 % (ref 11.5–15.5)
WBC: 7.4 10*3/uL (ref 4.0–10.5)

## 2019-08-04 LAB — LIPID PANEL
Cholesterol: 143 mg/dL (ref 0–200)
HDL: 52.2 mg/dL (ref >39.00)
LDL Cholesterol: 75 mg/dL (ref 0–99)
NonHDL: 91.06
Total CHOL/HDL Ratio: 3
Triglycerides: 79 mg/dL (ref 0.0–149.0)
VLDL: 15.8 mg/dL (ref 0.0–40.0)

## 2019-08-04 LAB — TSH: TSH: 5.22 u[IU]/mL — ABNORMAL HIGH (ref 0.35–4.50)

## 2019-08-04 LAB — COMPREHENSIVE METABOLIC PANEL
ALT: 8 U/L (ref 0–35)
AST: 14 U/L (ref 0–37)
Albumin: 3.7 g/dL (ref 3.5–5.2)
Alkaline Phosphatase: 61 U/L (ref 39–117)
BUN: 18 mg/dL (ref 6–23)
CO2: 33 mEq/L — ABNORMAL HIGH (ref 19–32)
Calcium: 9.4 mg/dL (ref 8.4–10.5)
Chloride: 100 mEq/L (ref 96–112)
Creatinine, Ser: 0.87 mg/dL (ref 0.40–1.20)
GFR: 76.31 mL/min (ref >60.00)
Glucose, Bld: 107 mg/dL — ABNORMAL HIGH (ref 70–99)
Potassium: 3.7 mEq/L (ref 3.5–5.1)
Sodium: 139 mEq/L (ref 135–145)
Total Bilirubin: 0.6 mg/dL (ref 0.2–1.2)
Total Protein: 7.2 g/dL (ref 6.0–8.3)

## 2019-08-04 LAB — HEMOGLOBIN A1C: Hgb A1c MFr Bld: 6.2 % (ref 4.6–6.5)

## 2019-08-05 LAB — MICROALBUMIN / CREATININE URINE RATIO
Creatinine, Urine: 35 mg/dL (ref 20–275)
Microalb Creat Ratio: 26 mcg/mg creat (ref <30)
Microalb, Ur: 0.9 mg/dL

## 2019-08-05 LAB — URINALYSIS, ROUTINE W REFLEX MICROSCOPIC
Bacteria, UA: NONE SEEN /HPF
Bilirubin Urine: NEGATIVE
Glucose, UA: NEGATIVE
Hyaline Cast: NONE SEEN /LPF
Ketones, ur: NEGATIVE
Nitrite: NEGATIVE
Protein, ur: NEGATIVE
RBC / HPF: NONE SEEN /HPF (ref 0–2)
Specific Gravity, Urine: 1.006 (ref 1.001–1.03)
pH: 6.5 (ref 5.0–8.0)

## 2019-08-06 ENCOUNTER — Other Ambulatory Visit: Payer: Self-pay | Admitting: Family

## 2019-08-06 ENCOUNTER — Other Ambulatory Visit: Payer: Self-pay

## 2019-08-06 ENCOUNTER — Other Ambulatory Visit (INDEPENDENT_AMBULATORY_CARE_PROVIDER_SITE_OTHER): Payer: Medicare HMO

## 2019-08-06 DIAGNOSIS — R7989 Other specified abnormal findings of blood chemistry: Secondary | ICD-10-CM

## 2019-08-06 DIAGNOSIS — E049 Nontoxic goiter, unspecified: Secondary | ICD-10-CM

## 2019-08-06 DIAGNOSIS — R8290 Unspecified abnormal findings in urine: Secondary | ICD-10-CM

## 2019-08-06 DIAGNOSIS — R350 Frequency of micturition: Secondary | ICD-10-CM

## 2019-08-06 LAB — T3, FREE: T3, Free: 3 pg/mL (ref 2.3–4.2)

## 2019-08-06 LAB — T4, FREE: Free T4: 0.82 ng/dL (ref 0.60–1.60)

## 2019-08-07 ENCOUNTER — Other Ambulatory Visit: Payer: Self-pay

## 2019-08-07 ENCOUNTER — Other Ambulatory Visit: Payer: Self-pay | Admitting: Family

## 2019-08-07 ENCOUNTER — Other Ambulatory Visit (INDEPENDENT_AMBULATORY_CARE_PROVIDER_SITE_OTHER): Payer: Medicare HMO

## 2019-08-07 DIAGNOSIS — E049 Nontoxic goiter, unspecified: Secondary | ICD-10-CM

## 2019-08-07 DIAGNOSIS — R7989 Other specified abnormal findings of blood chemistry: Secondary | ICD-10-CM

## 2019-08-07 DIAGNOSIS — R8290 Unspecified abnormal findings in urine: Secondary | ICD-10-CM

## 2019-08-07 DIAGNOSIS — R350 Frequency of micturition: Secondary | ICD-10-CM

## 2019-08-07 LAB — URINALYSIS, ROUTINE W REFLEX MICROSCOPIC
Bilirubin Urine: NEGATIVE
Ketones, ur: NEGATIVE
Nitrite: NEGATIVE
Specific Gravity, Urine: 1.01 (ref 1.000–1.030)
Total Protein, Urine: NEGATIVE
Urine Glucose: NEGATIVE
Urobilinogen, UA: 0.2 (ref 0.0–1.0)
pH: 7 (ref 5.0–8.0)

## 2019-08-07 LAB — THYROID PEROXIDASE ANTIBODY: Thyroperoxidase Ab SerPl-aCnc: <1 [IU]/mL

## 2019-08-08 ENCOUNTER — Other Ambulatory Visit: Payer: Self-pay

## 2019-08-08 ENCOUNTER — Other Ambulatory Visit: Payer: Self-pay | Admitting: Family

## 2019-08-08 ENCOUNTER — Other Ambulatory Visit: Payer: Self-pay | Admitting: Internal Medicine

## 2019-08-08 DIAGNOSIS — R319 Hematuria, unspecified: Secondary | ICD-10-CM

## 2019-08-08 DIAGNOSIS — R8281 Pyuria: Secondary | ICD-10-CM

## 2019-08-08 DIAGNOSIS — R350 Frequency of micturition: Secondary | ICD-10-CM

## 2019-08-08 MED ORDER — AMOXICILLIN-POT CLAVULANATE 875-125 MG PO TABS: 1.0000 | ORAL_TABLET | Freq: Two times a day (BID) | ORAL | 0 refills | Status: AC

## 2019-08-09 LAB — URINE CULTURE
MICRO NUMBER:: 10214650
SPECIMEN QUALITY:: ADEQUATE

## 2019-08-14 ENCOUNTER — Other Ambulatory Visit: Payer: Self-pay

## 2019-08-14 ENCOUNTER — Ambulatory Visit
Admission: RE | Admit: 2019-08-14 | Discharge: 2019-08-14 | Disposition: A | Discharge status: Home / Self Care | Payer: Medicare HMO | Source: Ambulatory Visit | Attending: Internal Medicine | Admitting: Internal Medicine

## 2019-08-14 DIAGNOSIS — R7989 Other specified abnormal findings of blood chemistry: Secondary | ICD-10-CM | POA: Diagnosis present

## 2019-08-14 DIAGNOSIS — E049 Nontoxic goiter, unspecified: Secondary | ICD-10-CM | POA: Diagnosis present

## 2019-09-04 ENCOUNTER — Ambulatory Visit
Admission: RE | Admit: 2019-09-04 | Discharge: 2019-09-04 | Disposition: A | Discharge status: Home / Self Care | Payer: Medicare HMO | Source: Ambulatory Visit | Attending: Internal Medicine | Admitting: Internal Medicine

## 2019-09-04 DIAGNOSIS — Z1231 Encounter for screening mammogram for malignant neoplasm of breast: Secondary | ICD-10-CM | POA: Diagnosis not present

## 2019-09-15 ENCOUNTER — Telehealth: Payer: Self-pay | Admitting: Family

## 2019-09-15 NOTE — Telephone Encounter (Signed)
Call pt Please sch UA to recheck urine

## 2019-09-16 ENCOUNTER — Other Ambulatory Visit: Payer: Self-pay

## 2019-09-16 ENCOUNTER — Other Ambulatory Visit (INDEPENDENT_AMBULATORY_CARE_PROVIDER_SITE_OTHER): Payer: Medicare HMO

## 2019-09-16 DIAGNOSIS — E049 Nontoxic goiter, unspecified: Secondary | ICD-10-CM

## 2019-09-16 DIAGNOSIS — R319 Hematuria, unspecified: Secondary | ICD-10-CM

## 2019-09-16 DIAGNOSIS — R8290 Unspecified abnormal findings in urine: Secondary | ICD-10-CM | POA: Diagnosis not present

## 2019-09-16 DIAGNOSIS — R350 Frequency of micturition: Secondary | ICD-10-CM

## 2019-09-16 DIAGNOSIS — R7989 Other specified abnormal findings of blood chemistry: Secondary | ICD-10-CM

## 2019-09-16 LAB — URINALYSIS, ROUTINE W REFLEX MICROSCOPIC
Bilirubin Urine: NEGATIVE
Ketones, ur: NEGATIVE
Nitrite: NEGATIVE
Specific Gravity, Urine: 1.02 (ref 1.000–1.030)
Total Protein, Urine: NEGATIVE
Urine Glucose: NEGATIVE
Urobilinogen, UA: 0.2 (ref 0.0–1.0)
pH: 6 (ref 5.0–8.0)

## 2019-09-16 NOTE — Telephone Encounter (Signed)
Patient actually scheduled today for U/A.

## 2019-09-18 LAB — URINE CULTURE
MICRO NUMBER:: 10357158
SPECIMEN QUALITY:: ADEQUATE

## 2019-09-19 ENCOUNTER — Other Ambulatory Visit: Payer: Self-pay | Admitting: Internal Medicine

## 2019-09-19 ENCOUNTER — Telehealth: Payer: Self-pay | Admitting: Internal Medicine

## 2019-09-19 DIAGNOSIS — N3000 Acute cystitis without hematuria: Secondary | ICD-10-CM

## 2019-09-19 MED ORDER — AMOXICILLIN-POT CLAVULANATE 875-125 MG PO TABS: 1.0000 | ORAL_TABLET | Freq: Two times a day (BID) | ORAL | 0 refills | Status: DC

## 2019-09-19 NOTE — Telephone Encounter (Signed)
T3, free Order: [id number] Collected:  08/06/2019 09:18 View Full Report  Ref Range & Units 1 mo ago  T3, Free 2.3 - 4.2 pg/mL 3.0           T4, free Order: [id number] Collected:  08/06/2019 09:18 View Full Report  Ref Range & Units 1 mo ago  Free T4 0.60 - 1.60 ng/dL 0.82   Comment: Specimens from patients who are undergoing biotin therapy and /or ingesting biotin supplements may contain high levels of biotin. The higher biotin concentration in these specimens interferes with this Free T4 assay. Specimens that contain high levels  of biotin may cause false high results for this Free T4 assay. Please interpret results in light of the total clinical presentation of the patient.           Thyroid peroxidase antibody Order: [id number] Collected:  08/06/2019 09:18 View Full Report  Ref Range & Units 1 mo ago  Thyroperoxidase Ab SerPl-aCnc <9 IU/mL <1

## 2019-09-19 NOTE — Telephone Encounter (Signed)
Will monitor Tsh for now

## 2019-09-19 NOTE — Telephone Encounter (Signed)
Patients daughter informed and verbalized understanding

## 2019-10-14 ENCOUNTER — Ambulatory Visit (INDEPENDENT_AMBULATORY_CARE_PROVIDER_SITE_OTHER): Payer: Medicare HMO | Admitting: Internal Medicine

## 2019-10-14 ENCOUNTER — Other Ambulatory Visit: Payer: Self-pay

## 2019-10-14 ENCOUNTER — Encounter: Payer: Self-pay | Admitting: Internal Medicine

## 2019-10-14 VITALS — BP 128/80 | HR 61 | Temp 97.0°F | Ht 65.0 in | Wt 255.6 lb

## 2019-10-14 DIAGNOSIS — N3000 Acute cystitis without hematuria: Secondary | ICD-10-CM

## 2019-10-14 DIAGNOSIS — I10 Essential (primary) hypertension: Secondary | ICD-10-CM | POA: Diagnosis not present

## 2019-10-14 DIAGNOSIS — E785 Hyperlipidemia, unspecified: Secondary | ICD-10-CM | POA: Diagnosis not present

## 2019-10-14 DIAGNOSIS — R946 Abnormal results of thyroid function studies: Secondary | ICD-10-CM | POA: Insufficient documentation

## 2019-10-14 DIAGNOSIS — E119 Type 2 diabetes mellitus without complications: Secondary | ICD-10-CM

## 2019-10-14 DIAGNOSIS — G4733 Obstructive sleep apnea (adult) (pediatric): Secondary | ICD-10-CM

## 2019-10-14 DIAGNOSIS — E042 Nontoxic multinodular goiter: Secondary | ICD-10-CM

## 2019-10-14 DIAGNOSIS — Z6841 Body Mass Index (BMI) 40.0 and over, adult: Secondary | ICD-10-CM

## 2019-10-14 DIAGNOSIS — Z9989 Dependence on other enabling machines and devices: Secondary | ICD-10-CM

## 2019-10-14 HISTORY — DX: Abnormal results of thyroid function studies: R94.6

## 2019-10-14 MED ORDER — AMLODIPINE BESYLATE 10 MG PO TABS: 10.0000 mg | ORAL_TABLET | Freq: Every day | ORAL | 3 refills | Status: DC

## 2019-10-14 MED ORDER — ATORVASTATIN CALCIUM 10 MG PO TABS: 10.0000 mg | ORAL_TABLET | Freq: Every day | ORAL | 3 refills | Status: DC

## 2019-10-14 MED ORDER — METFORMIN HCL 500 MG PO TABS: ORAL_TABLET | ORAL | 3 refills | Status: DC

## 2019-10-14 MED ORDER — CARVEDILOL 25 MG PO TABS: 25.0000 mg | ORAL_TABLET | Freq: Two times a day (BID) | ORAL | 3 refills | Status: DC

## 2019-10-14 MED ORDER — LOSARTAN POTASSIUM-HCTZ 100-25 MG PO TABS: 1.0000 | ORAL_TABLET | Freq: Every day | ORAL | 3 refills | Status: DC

## 2019-10-14 NOTE — Patient Instructions (Addendum)
Blood pressure goal <130/<80  Increase water intake 55-64 ounces daily   Consider colace/docusate stool softner as needed 100 mg 1-2 pills as needed   DASH Eating Plan DASH stands for "Dietary Approaches to Stop Hypertension." The DASH eating plan is a healthy eating plan that has been shown to reduce high blood pressure (hypertension). It may also reduce your risk for type 2 diabetes, heart disease, and stroke. The DASH eating plan may also help with weight loss. What are tips for following this plan?  General guidelines  Avoid eating more than 2,300 mg (milligrams) of salt (sodium) a day. If you have hypertension, you may need to reduce your sodium intake to 1,500 mg a day.  Limit alcohol intake to no more than 1 drink a day for nonpregnant women and 2 drinks a day for men. One drink equals 12 oz of beer, 5 oz of wine, or 1 oz of hard liquor.  Work with your health care provider to maintain a healthy body weight or to lose weight. Ask what an ideal weight is for you.  Get at least 30 minutes of exercise that causes your heart to beat faster (aerobic exercise) most days of the week. Activities may include walking, swimming, or biking.  Work with your health care provider or diet and nutrition specialist (dietitian) to adjust your eating plan to your individual calorie needs. Reading food labels   Check food labels for the amount of sodium per serving. Choose foods with less than 5 percent of the Daily Value of sodium. Generally, foods with less than 300 mg of sodium per serving fit into this eating plan.  To find whole grains, look for the word "whole" as the first word in the ingredient list. Shopping  Buy products labeled as "low-sodium" or "no salt added."  Buy fresh foods. Avoid canned foods and premade or frozen meals. Cooking  Avoid adding salt when cooking. Use salt-free seasonings or herbs instead of table salt or sea salt. Check with your health care provider or pharmacist  before using salt substitutes.  Do not fry foods. Cook foods using healthy methods such as baking, boiling, grilling, and broiling instead.  Cook with heart-healthy oils, such as olive, canola, soybean, or sunflower oil. Meal planning  Eat a balanced diet that includes: ? 5 or more servings of fruits and vegetables each day. At each meal, try to fill half of your plate with fruits and vegetables. ? Up to 6-8 servings of whole grains each day. ? Less than 6 oz of lean meat, poultry, or fish each day. A 3-oz serving of meat is about the same size as a deck of cards. One egg equals 1 oz. ? 2 servings of low-fat dairy each day. ? A serving of nuts, seeds, or beans 5 times each week. ? Heart-healthy fats. Healthy fats called Omega-3 fatty acids are found in foods such as flaxseeds and coldwater fish, like sardines, salmon, and mackerel.  Limit how much you eat of the following: ? Canned or prepackaged foods. ? Food that is high in trans fat, such as fried foods. ? Food that is high in saturated fat, such as fatty meat. ? Sweets, desserts, sugary drinks, and other foods with added sugar. ? Full-fat dairy products.  Do not salt foods before eating.  Try to eat at least 2 vegetarian meals each week.  Eat more home-cooked food and less restaurant, buffet, and fast food.  When eating at a restaurant, ask that your food be  prepared with less salt or no salt, if possible. What foods are recommended? The items listed may not be a complete list. Talk with your dietitian about what dietary choices are best for you. Grains Whole-grain or whole-wheat bread. Whole-grain or whole-wheat pasta. Brown rice. [place]. Bulgur. Whole-grain and low-sodium cereals. Pita bread. Low-fat, low-sodium crackers. Whole-wheat flour tortillas. Vegetables Fresh or frozen vegetables (raw, steamed, roasted, or grilled). Low-sodium or reduced-sodium tomato and vegetable juice. Low-sodium or reduced-sodium tomato  sauce and tomato paste. Low-sodium or reduced-sodium canned vegetables. Fruits All fresh, dried, or frozen fruit. Canned fruit in natural juice (without added sugar). Meat and other protein foods Skinless chicken or [place]. Ground chicken or [place]. Pork with fat trimmed off. Fish and seafood. Egg whites. Dried beans, peas, or lentils. Unsalted nuts, nut butters, and seeds. Unsalted canned beans. Lean cuts of beef with fat trimmed off. Low-sodium, lean deli meat. Dairy Low-fat (1%) or fat-free (skim) milk. Fat-free, low-fat, or reduced-fat cheeses. Nonfat, low-sodium ricotta or cottage cheese. Low-fat or nonfat yogurt. Low-fat, low-sodium cheese. Fats and oils Soft margarine without trans fats. Vegetable oil. Low-fat, reduced-fat, or light mayonnaise and salad dressings (reduced-sodium). Canola, safflower, olive, soybean, and sunflower oils. Avocado. Seasoning and other foods Herbs. Spices. Seasoning mixes without salt. Unsalted popcorn and pretzels. Fat-free sweets. What foods are not recommended? The items listed may not be a complete list. Talk with your dietitian about what dietary choices are best for you. Grains Baked goods made with fat, such as croissants, muffins, or some breads. Dry pasta or rice meal packs. Vegetables Creamed or fried vegetables. Vegetables in a cheese sauce. Regular canned vegetables (not low-sodium or reduced-sodium). Regular canned tomato sauce and paste (not low-sodium or reduced-sodium). Regular tomato and vegetable juice (not low-sodium or reduced-sodium). Angie Fava. Olives. Fruits Canned fruit in a light or heavy syrup. Fried fruit. Fruit in cream or butter sauce. Meat and other protein foods Fatty cuts of meat. Ribs. Fried meat. Berniece Salines. Sausage. Bologna and other processed lunch meats. Salami. Fatback. Hotdogs. Bratwurst. Salted nuts and seeds. Canned beans with added salt. Canned or smoked fish. Whole eggs or egg yolks. Chicken or [place] with skin. Dairy Whole  or 2% milk, cream, and half-and-half. Whole or full-fat cream cheese. Whole-fat or sweetened yogurt. Full-fat cheese. Nondairy creamers. Whipped toppings. Processed cheese and cheese spreads. Fats and oils Butter. Stick margarine. Lard. Shortening. Ghee. Bacon fat. Tropical oils, such as coconut, palm kernel, or palm oil. Seasoning and other foods Salted popcorn and pretzels. Onion salt, garlic salt, seasoned salt, table salt, and sea salt. Worcestershire sauce. Tartar sauce. Barbecue sauce. Teriyaki sauce. Soy sauce, including reduced-sodium. Steak sauce. Canned and packaged gravies. Fish sauce. Oyster sauce. Cocktail sauce. Horseradish that you find on the shelf. Ketchup. Mustard. Meat flavorings and tenderizers. Bouillon cubes. Hot sauce and Tabasco sauce. Premade or packaged marinades. Premade or packaged taco seasonings. Relishes. Regular salad dressings. Where to find more information:  National Heart, Lung, and [place]: https://wilson-eaton.com/  American Heart Association: www.heart.org Summary  The DASH eating plan is a healthy eating plan that has been shown to reduce high blood pressure (hypertension). It may also reduce your risk for type 2 diabetes, heart disease, and stroke.  With the DASH eating plan, you should limit salt (sodium) intake to 2,300 mg a day. If you have hypertension, you may need to reduce your sodium intake to 1,500 mg a day.  When on the DASH eating plan, aim to eat more fresh fruits and vegetables, whole grains,  lean proteins, low-fat dairy, and heart-healthy fats.  Work with your health care provider or diet and nutrition specialist (dietitian) to adjust your eating plan to your individual calorie needs. This information is not intended to replace advice given to you by your health care provider. Make sure you discuss any questions you have with your health care provider. Document Revised: 05/04/2017 Document Reviewed: 05/15/2016 Elsevier Patient Education   [place].   Diabetes Mellitus and Nutrition, Adult When you have diabetes (diabetes mellitus), it is very important to have healthy eating habits because your blood sugar (glucose) levels are greatly affected by what you eat and drink. Eating healthy foods in the appropriate amounts, at about the same times every day, can help you:  Control your blood glucose.  Lower your risk of heart disease.  Improve your blood pressure.  Reach or maintain a healthy weight. Every person with diabetes is different, and each person has different needs for a meal plan. Your health care provider may recommend that you work with a diet and nutrition specialist (dietitian) to make a meal plan that is best for you. Your meal plan may vary depending on factors such as:  The calories you need.  The medicines you take.  Your weight.  Your blood glucose, blood pressure, and cholesterol levels.  Your activity level.  Other health conditions you have, such as heart or kidney disease. How do carbohydrates affect me? Carbohydrates, also called carbs, affect your blood glucose level more than any other type of food. Eating carbs naturally raises the amount of glucose in your blood. Carb counting is a method for keeping track of how many carbs you eat. Counting carbs is important to keep your blood glucose at a healthy level, especially if you use insulin or take certain oral diabetes medicines. It is important to know how many carbs you can safely have in each meal. This is different for every person. Your dietitian can help you calculate how many carbs you should have at each meal and for each snack. Foods that contain carbs include:  Bread, cereal, rice, pasta, and crackers.  Potatoes and corn.  Peas, beans, and lentils.  Milk and yogurt.  Fruit and juice.  Desserts, such as cakes, cookies, ice cream, and candy. How does alcohol affect me? Alcohol can cause a sudden decrease in blood glucose  (hypoglycemia), especially if you use insulin or take certain oral diabetes medicines. Hypoglycemia can be a life-threatening condition. Symptoms of hypoglycemia (sleepiness, dizziness, and confusion) are similar to symptoms of having too much alcohol. If your health care provider says that alcohol is safe for you, follow these guidelines:  Limit alcohol intake to no more than 1 drink per day for nonpregnant women and 2 drinks per day for men. One drink equals 12 oz of beer, 5 oz of wine, or 1 oz of hard liquor.  Do not drink on an empty stomach.  Keep yourself hydrated with water, diet soda, or unsweetened iced tea.  Keep in mind that regular soda, juice, and other mixers may contain a lot of sugar and must be counted as carbs. What are tips for following this plan?  Reading food labels  Start by checking the serving size on the "Nutrition Facts" label of packaged foods and drinks. The amount of calories, carbs, fats, and other nutrients listed on the label is based on one serving of the item. Many items contain more than one serving per package.  Check the total grams (g)  of carbs in one serving. You can calculate the number of servings of carbs in one serving by dividing the total carbs by 15. For example, if a food has 30 g of total carbs, it would be equal to 2 servings of carbs.  Check the number of grams (g) of saturated and trans fats in one serving. Choose foods that have low or no amount of these fats.  Check the number of milligrams (mg) of salt (sodium) in one serving. Most people should limit total sodium intake to less than 2,300 mg per day.  Always check the nutrition information of foods labeled as "low-fat" or "nonfat". These foods may be higher in added sugar or refined carbs and should be avoided.  Talk to your dietitian to identify your daily goals for nutrients listed on the label. Shopping  Avoid buying canned, premade, or processed foods. These foods tend to be high  in fat, sodium, and added sugar.  Shop around the outside edge of the grocery store. This includes fresh fruits and vegetables, bulk grains, fresh meats, and fresh dairy. Cooking  Use low-heat cooking methods, such as baking, instead of high-heat cooking methods like deep frying.  Cook using healthy oils, such as olive, canola, or sunflower oil.  Avoid cooking with butter, cream, or high-fat meats. Meal planning  Eat meals and snacks regularly, preferably at the same times every day. Avoid going long periods of time without eating.  Eat foods high in fiber, such as fresh fruits, vegetables, beans, and whole grains. Talk to your dietitian about how many servings of carbs you can eat at each meal.  Eat 4-6 ounces (oz) of lean protein each day, such as lean meat, chicken, fish, eggs, or tofu. One oz of lean protein is equal to: ? 1 oz of meat, chicken, or fish. ? 1 egg. ?  cup of tofu.  Eat some foods each day that contain healthy fats, such as avocado, nuts, seeds, and fish. Lifestyle  Check your blood glucose regularly.  Exercise regularly as told by your health care provider. This may include: ? 150 minutes of moderate-intensity or vigorous-intensity exercise each week. This could be brisk walking, biking, or water aerobics. ? Stretching and doing strength exercises, such as yoga or weightlifting, at least 2 times a week.  Take medicines as told by your health care provider.  Do not use any products that contain nicotine or tobacco, such as cigarettes and e-cigarettes. If you need help quitting, ask your health care provider.  Work with a Social worker or diabetes educator to identify strategies to manage stress and any emotional and social challenges. Questions to ask a health care provider  Do I need to meet with a diabetes educator?  Do I need to meet with a dietitian?  What number can I call if I have questions?  When are the best times to check my blood glucose? Where to  find more information:  American Diabetes Association: diabetes.org  Academy of Nutrition and Dietetics: www.eatright.[employer] of Diabetes and Digestive and Kidney Diseases (NIH): [employer] Summary  A healthy meal plan will help you control your blood glucose and maintain a healthy lifestyle.  Working with a diet and nutrition specialist (dietitian) can help you make a meal plan that is best for you.  Keep in mind that carbohydrates (carbs) and alcohol have immediate effects on your blood glucose levels. It is important to count carbs and to use alcohol carefully. This information is not intended to  replace advice given to you by your health care provider. Make sure you discuss any questions you have with your health care provider. Document Revised: 05/04/2017 Document Reviewed: 06/26/2016 Elsevier Patient Education  2020 [employer].

## 2019-10-14 NOTE — Progress Notes (Signed)
Chief Complaint  Patient presents with  . Follow-up   F/u  1. HTN on norvasc 10 mg qd, coreg 25 mg bid, hyzaar 100-25 mg qam hydralazine 10 mg bid prn at times BP controlled today at times 160/170 2. UTI resolved and less freq urination per pt recheck urine today  3. OSA on cpap needs repeat sleep study machine over 78 years old   Review of Systems  Constitutional: Negative for weight loss.  HENT: Negative for hearing loss.   Eyes: Negative for blurred vision.  Respiratory: Negative for shortness of breath.   Cardiovascular: Negative for chest pain.  Gastrointestinal: Negative for abdominal pain.  Musculoskeletal: Negative for falls.  Skin: Negative for rash.  Neurological: Negative for headaches.  Psychiatric/Behavioral: Negative for memory loss.   Past Medical History:  Diagnosis Date  . Arthritis   . Diabetes ([place]) 02/19/2015  . Diverticulosis 12/07/2016   Sigmoid Colon  . Diverticulosis   . Diverticulosis of sigmoid colon 12/08/2016  . Elevated blood sugar 01/15/2015  . GERD (gastroesophageal reflux disease)   . Hemorrhoids   . History of blood transfusion   . Hyperlipidemia   . Hypertension   . Hypertension 01/15/2015  . Obesity 01/15/2015  . OSA on CPAP   . Sleep apnea    Past Surgical History:  Procedure Laterality Date  . BOTOX INJECTION N/A 03/27/2018   Procedure: BOTOX INJECTION;  Surgeon: Jules Husbands, MD;  Location: [facility];  Service: General;  Laterality: N/A;  . COLONOSCOPY    . COLONOSCOPY WITH PROPOFOL N/A 12/07/2016   Procedure: COLONOSCOPY WITH PROPOFOL;  Surgeon: [name], MD;  Location: [facility];  Service: Endoscopy;  Laterality: N/A;  Diabetic - oral meds  . EVALUATION UNDER ANESTHESIA WITH ANAL FISSUROTOMY N/A 03/27/2018   Procedure: EXAM UNDER ANESTHESIA, CHEMICAL SPHINCTEROTOMY;  Surgeon: Jules Husbands, MD;  Location: [facility];  Service: General;  Laterality: N/A;  . HEMORRHOID SURGERY N/A 06/13/2018   Procedure: [id number];   Surgeon: Jules Husbands, MD;  Location: [facility];  Service: General;  Laterality: N/A;  . JOINT REPLACEMENT     b/l hips in [place] 2004 and 2005 Dr. Alfonso Ramus   . POLYPECTOMY  12/07/2016   Procedure: POLYPECTOMY;  Surgeon: [name], MD;  Location: [facility];  Service: Endoscopy;;  . RECTAL EXAM UNDER ANESTHESIA N/A 06/13/2018   Procedure: RECTAL EXAM UNDER ANESTHESIA;  Surgeon: Jules Husbands, MD;  Location: [facility];  Service: General;  Laterality: N/A;  . TOTAL HIP ARTHROPLASTY Bilateral   . TUBAL LIGATION     Family History  Problem Relation Age of Onset  . Dementia Mother   . Hypertension Mother   . Arthritis Mother   . Alcohol abuse Father   . Cirrhosis Father   . Cancer Daughter        breast  . COPD Brother   . Breast cancer Sister 13  . Cancer Sister        ? type   . Dementia Sister   . Cancer Brother        ?type  . Cancer Brother        cancer ?type  . Cancer Daughter        cancer ?type died 55   . Alzheimer's disease Sister    Social History   Socioeconomic History  . Marital status: Divorced    Spouse name: Not on file  . Number of children: Not on file  . Years of education: Not  on file  . Highest education level: Not on file  Occupational History  . Occupation: Astronomer covers    Comment: retired  Tobacco Use  . Smoking status: Never Smoker  . Smokeless tobacco: Never Used  Substance and Sexual Activity  . Alcohol use: No    Alcohol/week: 0.0 standard drinks  . Drug use: No  . Sexual activity: Not Currently  Other Topics Concern  . Not on file  Social History Narrative   Lives with daughter [name] to go to church    Moved from [place] 3-4 years ago    No guns, wears seat belt    Never smoker    Social Determinants of Health   Financial Resource Strain: Low Risk   . Difficulty of Paying Living Expenses: Not hard at all  Food Insecurity: No Food Insecurity  . Worried About Ship broker in the Last Year: Never true  . Ran Out of Food in the Last Year: Never true  Transportation Needs: No Transportation Needs  . Lack of Transportation (Medical): No  . Lack of Transportation (Non-Medical): No  Physical Activity:   . Days of Exercise per Week:   . Minutes of Exercise per Session:   Stress: No Stress Concern Present  . Feeling of Stress : Not at all  Social Connections:   . Frequency of Communication with Friends and Family:   . Frequency of Social Gatherings with Friends and Family:   . Attends Religious Services:   . Active Member of Clubs or Organizations:   . Attends Archivist Meetings:   Marland Kitchen Marital Status:   Intimate Partner Violence:   . Fear of Current or Ex-Partner:   . Emotionally Abused:   Marland Kitchen Physically Abused:   . Sexually Abused:    Current Meds  Medication Sig  . amLODipine (NORVASC) 10 MG tablet Take 1 tablet (10 mg total) by mouth daily.  Marland Kitchen atorvastatin (LIPITOR) 10 MG tablet Take 1 tablet (10 mg total) by mouth daily at 6 PM.  . Calcium Carb-Cholecalciferol (CALCIUM 600+D) 600-800 MG-UNIT TABS Take 1 tablet by mouth 2 (two) times daily.  . carvedilol (COREG) 25 MG tablet Take 1 tablet (25 mg total) by mouth 2 (two) times daily with a meal.  . docusate sodium (COLACE) 100 MG capsule Take 100 mg by mouth 2 (two) times daily as needed.  Noelle Penner FIBER SUPPLEMENT PO Take 1 Dose by mouth daily.   . hydrALAZINE (APRESOLINE) 10 MG tablet Take 1 tablet (10 mg total) by mouth 2 (two) times daily as needed. Take if BP >140/>80  . losartan-hydrochlorothiazide (HYZAAR) 100-25 MG tablet Take 1 tablet by mouth daily. In am  . metFORMIN (GLUCOPHAGE) 500 MG tablet 1 pill daily in the am with food  . Omega-3 Fatty Acids (FISH OIL) 1000 MG CAPS Take 1,000 mg by mouth 2 (two) times daily.   Marland Kitchen omeprazole (PRILOSEC) 20 MG capsule Take 20 mg by mouth daily.  . Psyllium (METAMUCIL FIBER PO) Take by mouth as needed.  Marland Kitchen UNABLE TO FIND Med Name: 0.3% Nifedipine +  1.5% Lidocaine in [facility]- Place on Anal area TID. (Patient taking differently: Place 1 application rectally 3 (three) times daily. Med Name: 0.3% Nifedipine + 1.5% Lidocaine in [facility]- Place on Anal area TID.)  . [DISCONTINUED] amLODipine (NORVASC) 10 MG tablet Take 1 tablet (10 mg total) by mouth daily.  . [DISCONTINUED] atorvastatin (LIPITOR) 10 MG tablet Take  1 tablet (10 mg total) by mouth daily at 6 PM.  . [DISCONTINUED] carvedilol (COREG) 25 MG tablet Take 1 tablet (25 mg total) by mouth 2 (two) times daily with a meal.  . [DISCONTINUED] losartan-hydrochlorothiazide (HYZAAR) 100-25 MG tablet Take 1 tablet by mouth daily. In am  . [DISCONTINUED] metFORMIN (GLUCOPHAGE) 500 MG tablet 1 pill daily in the am with food   No Known Allergies Recent Results (from the past 2160 hour(s))  Microalbumin / creatinine urine ratio     Status: None   Collection Time: 08/04/19 10:15 AM  Result Value Ref Range   Creatinine, Urine 35 20 - 275 mg/dL   Microalb, Ur 0.9 mg/dL    Comment: Reference Range Not established    Microalb Creat Ratio 26 <30 mcg/mg creat    Comment: . The ADA defines abnormalities in albumin excretion as follows: Marland Kitchen Category         Result (mcg/mg creatinine) . Normal                    <30 Microalbuminuria         30-299  Clinical albuminuria   > OR = 300 . The ADA recommends that at least two of three specimens collected within a 3-6 month period be abnormal before considering a patient to be within a diagnostic category.   HgB A1c     Status: None   Collection Time: 08/04/19 10:15 AM  Result Value Ref Range   Hgb A1c MFr Bld 6.2 4.6 - 6.5 %    Comment: Glycemic Control Guidelines for People with Diabetes:Non Diabetic:  <6%Goal of Therapy: <7%Additional Action Suggested:  >8%   Urinalysis, Routine w reflex microscopic     Status: Abnormal   Collection Time: 08/04/19 10:15 AM  Result Value Ref Range   Color, Urine YELLOW YELLOW   APPearance CLEAR  CLEAR   Specific Gravity, Urine 1.006 1.001 - 1.03   pH 6.5 5.0 - 8.0   Glucose, UA NEGATIVE NEGATIVE   Bilirubin Urine NEGATIVE NEGATIVE   Ketones, ur NEGATIVE NEGATIVE   Hgb urine dipstick TRACE (A) NEGATIVE   Protein, ur NEGATIVE NEGATIVE   Nitrite NEGATIVE NEGATIVE   Leukocytes,Ua 2+ (A) NEGATIVE   WBC, UA 20-40 (A) 0 - 5 /HPF   RBC / HPF NONE SEEN 0 - 2 /HPF   Squamous Epithelial / LPF 0-5 < OR = 5 /HPF   Bacteria, UA NONE SEEN NONE SEEN /HPF   Hyaline Cast NONE SEEN NONE SEEN /LPF  TSH     Status: Abnormal   Collection Time: 08/04/19 10:15 AM  Result Value Ref Range   TSH 5.22 (H) 0.35 - 4.50 uIU/mL  Lipid panel     Status: None   Collection Time: 08/04/19 10:15 AM  Result Value Ref Range   Cholesterol 143 0 - 200 mg/dL    Comment: ATP III Classification       Desirable:  < 200 mg/dL               Borderline High:  200 - 239 mg/dL          High:  > = 240 mg/dL   Triglycerides 79.0 0.0 - 149.0 mg/dL    Comment: Normal:  <150 mg/dLBorderline High:  150 - 199 mg/dL   HDL 52.20 >39.00 mg/dL   VLDL 15.8 0.0 - 40.0 mg/dL   LDL Cholesterol 75 0 - 99 mg/dL   Total CHOL/HDL Ratio 3  Comment:                Men          Women1/2 Average Risk     3.4          3.3Average Risk          5.0          4.42X Average Risk          9.6          7.13X Average Risk          15.0          11.0                       NonHDL 91.06     Comment: NOTE:  Non-HDL goal should be 30 mg/dL higher than patient's LDL goal (i.e. LDL goal of < 70 mg/dL, would have non-HDL goal of < 100 mg/dL)  CBC with Differential/Platelet     Status: Abnormal   Collection Time: 08/04/19 10:15 AM  Result Value Ref Range   WBC 7.4 4.0 - 10.5 K/uL   RBC 4.41 3.87 - 5.11 Mil/uL   Hemoglobin 11.5 (L) 12.0 - 15.0 g/dL   HCT 35.5 (L) 36.0 - 46.0 %   MCV 80.5 78.0 - 100.0 fl   MCHC 32.4 30.0 - 36.0 g/dL   RDW 14.2 11.5 - 15.5 %   Platelets 208.0 150.0 - 400.0 K/uL   Neutrophils Relative % 44.7 43.0 - 77.0 %   Lymphocytes  Relative 39.6 12.0 - 46.0 %   Monocytes Relative 9.8 3.0 - 12.0 %   Eosinophils Relative 4.9 0.0 - 5.0 %   Basophils Relative 1.0 0.0 - 3.0 %   Neutro Abs 3.3 1.4 - 7.7 K/uL   Lymphs Abs 2.9 0.7 - 4.0 K/uL   Monocytes Absolute 0.7 0.1 - 1.0 K/uL   Eosinophils Absolute 0.4 0.0 - 0.7 K/uL   Basophils Absolute 0.1 0.0 - 0.1 K/uL  Comprehensive metabolic panel     Status: Abnormal   Collection Time: 08/04/19 10:15 AM  Result Value Ref Range   Sodium 139 135 - 145 mEq/L   Potassium 3.7 3.5 - 5.1 mEq/L   Chloride 100 96 - 112 mEq/L   CO2 33 (H) 19 - 32 mEq/L   Glucose, Bld 107 (H) 70 - 99 mg/dL   BUN 18 6 - 23 mg/dL   Creatinine, Ser 0.87 0.40 - 1.20 mg/dL   Total Bilirubin 0.6 0.2 - 1.2 mg/dL   Alkaline Phosphatase 61 39 - 117 U/L   AST 14 0 - 37 U/L   ALT 8 0 - 35 U/L   Total Protein 7.2 6.0 - 8.3 g/dL   Albumin 3.7 3.5 - 5.2 g/dL   GFR 76.31 >60.00 mL/min   Calcium 9.4 8.4 - 10.5 mg/dL  T3, free     Status: None   Collection Time: 08/06/19  9:18 AM  Result Value Ref Range   T3, Free 3.0 2.3 - 4.2 pg/mL  Thyroid peroxidase antibody     Status: None   Collection Time: 08/06/19  9:18 AM  Result Value Ref Range   Thyroperoxidase Ab SerPl-aCnc <1 <9 IU/mL  T4, free     Status: None   Collection Time: 08/06/19  9:18 AM  Result Value Ref Range   Free T4 0.82 0.60 - 1.60 ng/dL    Comment: Specimens from patients who are undergoing biotin therapy and /or  ingesting biotin supplements may contain high levels of biotin.  The higher biotin concentration in these specimens interferes with this Free T4 assay.  Specimens that contain high levels  of biotin may cause false high results for this Free T4 assay.  Please interpret results in light of the total clinical presentation of the patient.    Urine Culture     Status: Abnormal   Collection Time: 08/07/19  9:55 AM   Specimen: Urine  Result Value Ref Range   MICRO NUMBER: [id number]    SPECIMEN QUALITY: Adequate    Sample Source NOT GIVEN     STATUS: FINAL    ISOLATE 1: Klebsiella pneumoniae (A)     Comment: 50,000-100,000 CFU/mL of Klebsiella pneumoniae      Susceptibility   Klebsiella pneumoniae - URINE CULTURE, REFLEX    AMOX/CLAVULANIC 4 Sensitive     AMPICILLIN 16 Resistant     AMPICILLIN/SULBACTAM 4 Sensitive     CEFAZOLIN* <=4 Not Reportable      * For infections other than uncomplicated UTIcaused by E. coli, K. pneumoniae or P. mirabilis:Cefazolin is resistant if MIC > or = 8 mcg/mL.(Distinguishing susceptible versus intermediatefor isolates with MIC < or = 4 mcg/mL requiresadditional testing.)For uncomplicated UTI caused by E. coli,K. pneumoniae or P. mirabilis: Cefazolin issusceptible if MIC <32 mcg/mL and predictssusceptible to the oral agents cefaclor, cefdinir,cefpodoxime, cefprozil, cefuroxime, cephalexinand loracarbef.    CEFEPIME <=1 Sensitive     CEFTRIAXONE <=1 Sensitive     CIPROFLOXACIN <=0.25 Sensitive     LEVOFLOXACIN <=0.12 Sensitive     ERTAPENEM <=0.5 Sensitive     GENTAMICIN <=1 Sensitive     IMIPENEM <=0.25 Sensitive     NITROFURANTOIN <=16 Sensitive     PIP/TAZO <=4 Sensitive     TOBRAMYCIN <=1 Sensitive     TRIMETH/SULFA* <=20 Sensitive      * For infections other than uncomplicated UTIcaused by E. coli, K. pneumoniae or P. mirabilis:Cefazolin is resistant if MIC > or = 8 mcg/mL.(Distinguishing susceptible versus intermediatefor isolates with MIC < or = 4 mcg/mL requiresadditional testing.)For uncomplicated UTI caused by E. coli,K. pneumoniae or P. mirabilis: Cefazolin issusceptible if MIC <32 mcg/mL and predictssusceptible to the oral agents cefaclor, cefdinir,cefpodoxime, cefprozil, cefuroxime, cephalexinand loracarbef.Legend:S = Susceptible  I = IntermediateR = Resistant  NS = Not susceptible* = Not tested  NR = Not reported**NN = See antimicrobic comments  Urinalysis, Routine w reflex microscopic     Status: Abnormal   Collection Time: 08/07/19  9:55 AM  Result Value Ref Range   Color, Urine  YELLOW Yellow;Lt. Yellow;Straw;Dark Yellow;Amber;Green;Red;Brown   APPearance CLEAR Clear;Turbid;Slightly Cloudy;Cloudy   Specific Gravity, Urine 1.010 1.000 - 1.030   pH 7.0 5.0 - 8.0   Total Protein, Urine NEGATIVE Negative   Urine Glucose NEGATIVE Negative   Ketones, ur NEGATIVE Negative   Bilirubin Urine NEGATIVE Negative   Hgb urine dipstick TRACE-INTACT (A) Negative   Urobilinogen, UA 0.2 0.0 - 1.0   Leukocytes,Ua MODERATE (A) Negative   Nitrite NEGATIVE Negative   WBC, UA 7-10/hpf (A) 0-2/hpf   RBC / HPF 0-2/hpf 0-2/hpf  Urine Culture     Status: Abnormal   Collection Time: 09/16/19  9:44 AM   Specimen: Urine  Result Value Ref Range   MICRO NUMBER: [id number]    SPECIMEN QUALITY: Adequate    Sample Source NOT GIVEN    STATUS: FINAL    ISOLATE 1: Klebsiella pneumoniae (A)     Comment: Greater than 100,000 CFU/mL of Klebsiella pneumoniae  Susceptibility   Klebsiella pneumoniae - URINE CULTURE, REFLEX    AMOX/CLAVULANIC 4 Sensitive     AMPICILLIN 16 Resistant     AMPICILLIN/SULBACTAM 4 Sensitive     CEFAZOLIN* <=4 Not Reportable      * For infections other than uncomplicated UTIcaused by E. coli, K. pneumoniae or P. mirabilis:Cefazolin is resistant if MIC > or = 8 mcg/mL.(Distinguishing susceptible versus intermediatefor isolates with MIC < or = 4 mcg/mL requiresadditional testing.)For uncomplicated UTI caused by E. coli,K. pneumoniae or P. mirabilis: Cefazolin issusceptible if MIC <32 mcg/mL and predictssusceptible to the oral agents cefaclor, cefdinir,cefpodoxime, cefprozil, cefuroxime, cephalexinand loracarbef.    CEFEPIME <=1 Sensitive     CEFTRIAXONE <=1 Sensitive     CIPROFLOXACIN <=0.25 Sensitive     LEVOFLOXACIN <=0.12 Sensitive     ERTAPENEM <=0.5 Sensitive     GENTAMICIN <=1 Sensitive     IMIPENEM <=0.25 Sensitive     NITROFURANTOIN 32 Sensitive     PIP/TAZO <=4 Sensitive     TOBRAMYCIN <=1 Sensitive     TRIMETH/SULFA* <=20 Sensitive      * For infections  other than uncomplicated UTIcaused by E. coli, K. pneumoniae or P. mirabilis:Cefazolin is resistant if MIC > or = 8 mcg/mL.(Distinguishing susceptible versus intermediatefor isolates with MIC < or = 4 mcg/mL requiresadditional testing.)For uncomplicated UTI caused by E. coli,K. pneumoniae or P. mirabilis: Cefazolin issusceptible if MIC <32 mcg/mL and predictssusceptible to the oral agents cefaclor, cefdinir,cefpodoxime, cefprozil, cefuroxime, cephalexinand loracarbef.Legend:S = Susceptible  I = IntermediateR = Resistant  NS = Not susceptible* = Not tested  NR = Not reported**NN = See antimicrobic comments  Urinalysis, Routine w reflex microscopic     Status: Abnormal   Collection Time: 09/16/19  9:44 AM  Result Value Ref Range   Color, Urine YELLOW Yellow;Lt. Yellow;Straw;Dark Yellow;Amber;Green;Red;Brown   APPearance Sl Cloudy (A) Clear;Turbid;Slightly Cloudy;Cloudy   Specific Gravity, Urine 1.020 1.000 - 1.030   pH 6.0 5.0 - 8.0   Total Protein, Urine NEGATIVE Negative   Urine Glucose NEGATIVE Negative   Ketones, ur NEGATIVE Negative   Bilirubin Urine NEGATIVE Negative   Hgb urine dipstick TRACE-INTACT (A) Negative   Urobilinogen, UA 0.2 0.0 - 1.0   Leukocytes,Ua MODERATE (A) Negative   Nitrite NEGATIVE Negative   WBC, UA 11-20/hpf (A) 0-2/hpf   RBC / HPF 0-2/hpf 0-2/hpf   Squamous Epithelial / LPF Rare(0-4/hpf) Rare(0-4/hpf)   Bacteria, UA Many(>50/hpf) (A) None   Objective  Body mass index is 42.53 kg/m. Wt Readings from Last 3 Encounters:  10/14/19 255 lb 9.6 oz (115.9 kg)  06/13/19 250 lb (113.4 kg)  02/04/19 251 lb 9.6 oz (114.1 kg)   Temp Readings from Last 3 Encounters:  10/14/19 (!) 97 F (36.1 C) (Temporal)  02/04/19 97.7 F (36.5 C) (Temporal)  12/09/18 98.4 F (36.9 C) (Oral)   BP Readings from Last 3 Encounters:  10/14/19 128/80  06/13/19 (!) 143/74  02/04/19 136/82   Pulse Readings from Last 3 Encounters:  10/14/19 61  02/04/19 60  12/09/18 61     Physical Exam Vitals and nursing note reviewed.  Constitutional:      Appearance: Normal appearance. She is well-developed and well-groomed. She is morbidly obese.  HENT:     Head: Normocephalic and atraumatic.  Eyes:     Conjunctiva/sclera: Conjunctivae normal.     Pupils: Pupils are equal, round, and reactive to light.  Cardiovascular:     Rate and Rhythm: Normal rate and regular rhythm.  Heart sounds: Normal heart sounds. No murmur.  Pulmonary:     Effort: Pulmonary effort is normal.     Breath sounds: Normal breath sounds.  Skin:    General: Skin is warm and dry.  Neurological:     General: No focal deficit present.     Mental Status: She is alert and oriented to person, place, and time. Mental status is at baseline.     Gait: Gait normal.  Psychiatric:        Attention and Perception: Attention and perception normal.        Mood and Affect: Mood and affect normal.        Speech: Speech normal.        Behavior: Behavior normal. Behavior is cooperative.        Thought Content: Thought content normal.        Cognition and Memory: Cognition and memory normal.        Judgment: Judgment normal.     Assessment  Plan  Essential hypertension - Plan: amLODipine (NORVASC) 10 MG tablet, carvedilol (COREG) 25 MG tablet, losartan-hydrochlorothiazide (HYZAAR) 100-25 MG tablet Hydralazine 10 mg bid prn consider increase to 25 mg  BP controlled today  Acute cystitis without hematuria - Plan: Urinalysis, Routine w reflex microscopic, Urine Culture  Hyperlipidemia - Plan: atorvastatin (LIPITOR) 10 MG tablet  Type 2 diabetes mellitus without complication, without long-term current use of insulin (HCC) - Plan: metFORMIN (GLUCOPHAGE) 500 MG tablet Foot exam today  Left 2nd toe 0.2 cm mass consider podiatry in future   OSA on CPAP - Plan: Ambulatory referral to Pulmonology  Morbid obesity with BMI of 40.0-44.9, adult ([place])  rec healthy diet  HM Flu shotutd Tdap had  07/04/2018 pna 23 had 10/12/15will pna 23 06/2019 prevnarutd Had zostervax, shingrix had 2/2 2/2 covid 19   Mammogram 09/04/19 negative  Out of age window pap  Colonoscopy had 12/07/16 IH, diverticulosis and tubular adenoma f/u in 5 years Dr. Allen Norris  DEXA get report prior [facility]CP Dr. Quillian [facility] per pt had 5 years ago -consider repeatno record   OSA f/u pulm, p[facility] sch 11/12/2018 and f/u pulm 11/19/2018, referred again   dentist appt sch 10/20/19   Provider: Dr. Olivia Mackie McLean-Scocuzza-Internal Medicine

## 2019-10-16 LAB — URINE CULTURE
MICRO NUMBER:: 10463702
SPECIMEN QUALITY:: ADEQUATE

## 2019-10-16 LAB — URINALYSIS, ROUTINE W REFLEX MICROSCOPIC
Bilirubin Urine: NEGATIVE
Glucose, UA: NEGATIVE
Hgb urine dipstick: NEGATIVE
Ketones, ur: NEGATIVE
Leukocytes,Ua: NEGATIVE
Nitrite: NEGATIVE
Protein, ur: NEGATIVE
Specific Gravity, Urine: 1.006 (ref 1.001–1.03)
pH: 7 (ref 5.0–8.0)

## 2019-11-19 ENCOUNTER — Other Ambulatory Visit: Payer: Self-pay | Admitting: Internal Medicine

## 2019-11-19 DIAGNOSIS — K219 Gastro-esophageal reflux disease without esophagitis: Secondary | ICD-10-CM

## 2019-11-19 MED ORDER — OMEPRAZOLE 20 MG PO CPDR: 20.0000 mg | DELAYED_RELEASE_CAPSULE | Freq: Every day | ORAL | 3 refills | Status: DC

## 2019-11-24 ENCOUNTER — Ambulatory Visit: Payer: Medicare HMO | Admitting: Acute Care

## 2019-11-24 ENCOUNTER — Encounter: Payer: Self-pay | Admitting: Acute Care

## 2019-11-24 ENCOUNTER — Other Ambulatory Visit: Payer: Self-pay

## 2019-11-24 VITALS — BP 150/80 | HR 70 | Temp 97.5°F | Ht 64.0 in | Wt 255.0 lb

## 2019-11-24 DIAGNOSIS — G4733 Obstructive sleep apnea (adult) (pediatric): Secondary | ICD-10-CM | POA: Diagnosis not present

## 2019-11-24 NOTE — Progress Notes (Signed)
History of Present Illness Taylor Shaw is a 79 y.o. female with OSA on CPAP. She is followed by Dr. Mortimer Fries.   11/24/2019 Pt. Presents for follow up of OSA on CPAP. She has excellent  compliant with CPAP. Usage data reviewed below. Her machine however is > 23 years old. She will need a repeat sleep study to get a new Machine per request of her PCP Dr. Aundra Dubin.. She states she would prefer to do a home sleep study if possible. We will place an order for a home sleep study.Once the results have been reviewed, we will place an order for a new machine. She states she has had no trouble with her machine, however, parts are beginning to get loose. .She states she has no daytime sleepiness. She usually awakens at 6:30 am and she goes to bed between 10:30 and 11 pm. No morning headaches. Very few leaks per her down Load with excellent control of her apnea. Pt. Has had her Covid Vaccinations.   Test Results: CPAP Down Load 11/10/2019-12/09/2019>> Raw data personally reviewed S 9 Elite, set pressure of 16 cm H2O AHI is 0.3 Usage 29/30 days( 97%) > 4 hours 29 days ( 97%) Average usage 8 hours 38 minutes.   CPAP download data 01/10/2018-04/09/2018.>>Raw data personally reviewed , down Load graphs reviewed. Uses greater than 4 hours is 86/90 days. Average usage on days used 7 hours 20 minutes. Set pressure is 16 CPAP. Leaks are minimal. Residual AHI 0.6. Overall this shows very good compliance with CPAP with excellent control of obstructive sleep apnea. **CPAP download 09/11/2018-12/09/2018>>raw data personally reviewed. Usage greater than 4 hours 85/90 days. Average usage on days used is 8 hours 31 minutes. Set pressure 16, leaks are within normal limits, residual AHI 0.4. Overall this shows very good compliance with CPAP with excellent control obstructive sleep apnea.   **Baseline PSG 12/14/2010: Severe obstructive sleep apnea with AHI of 39. CPAP titration was recommended  CBC Latest Ref Rng & Units  08/04/2019 02/04/2019 07/04/2018  WBC 4.0 - 10.5 K/uL 7.4 7.5 8.4  Hemoglobin 12.0 - 15.0 g/dL 11.5(L) 11.8(L) 11.7(L)  Hematocrit 36 - 46 % 35.5(L) 37.1 37.1  Platelets 150 - 400 K/uL 208.0 216.0 279.0    BMP Latest Ref Rng & Units 08/04/2019 02/04/2019 07/04/2018  Glucose 70 - 99 mg/dL 107(H) 105(H) 111(H)  BUN 6 - 23 mg/dL 18 19 11   Creatinine 0.40 - 1.20 mg/dL 0.87 0.77 0.74  BUN/Creat Ratio 11 - 26 - - -  Sodium 135 - 145 mEq/L 139 140 139  Potassium 3.5 - 5.1 mEq/L 3.7 3.8 3.9  Chloride 96 - 112 mEq/L 100 100 102  CO2 19 - 32 mEq/L 33(H) 34(H) 31  Calcium 8.4 - 10.5 mg/dL 9.4 9.4 9.6    BNP No results found for: BNP  ProBNP No results found for: PROBNP  PFT No results found for: FEV1PRE, FEV1POST, FVCPRE, FVCPOST, TLC, DLCOUNC, PREFEV1FVCRT, PSTFEV1FVCRT  No results found.   Past medical hx Past Medical History:  Diagnosis Date  . Arthritis   . Diabetes ([place]) 02/19/2015  . Diverticulosis 12/07/2016   Sigmoid Colon  . Diverticulosis   . Diverticulosis of sigmoid colon 12/08/2016  . Elevated blood sugar 01/15/2015  . GERD (gastroesophageal reflux disease)   . Hemorrhoids   . History of blood transfusion   . Hyperlipidemia   . Hypertension   . Hypertension 01/15/2015  . Obesity 01/15/2015  . OSA on CPAP   . Sleep apnea  Social History   Tobacco Use  . Smoking status: Never Smoker  . Smokeless tobacco: Never Used  Vaping Use  . Vaping Use: Never used  Substance Use Topics  . Alcohol use: No    Alcohol/week: 0.0 standard drinks  . Drug use: No    [name] reports that she has never smoked. She has never used smokeless tobacco. She reports that she does not drink alcohol and does not use drugs.  Tobacco Cessation: Never smoker  Past surgical hx, Family hx, Social hx all reviewed.  Current Outpatient Medications on File Prior to Visit  Medication Sig  . amLODipine (NORVASC) 10 MG tablet Take 1 tablet (10 mg total) by mouth daily.  Marland Kitchen atorvastatin  (LIPITOR) 10 MG tablet Take 1 tablet (10 mg total) by mouth daily at 6 PM.  . Calcium Carb-Cholecalciferol (CALCIUM 600+D) 600-800 MG-UNIT TABS Take 1 tablet by mouth 2 (two) times daily.  . carvedilol (COREG) 25 MG tablet Take 1 tablet (25 mg total) by mouth 2 (two) times daily with a meal.  . docusate sodium (COLACE) 100 MG capsule Take 100 mg by mouth 2 (two) times daily as needed.  . hydrALAZINE (APRESOLINE) 10 MG tablet Take 1 tablet (10 mg total) by mouth 2 (two) times daily as needed. Take if BP >140/>80  . losartan-hydrochlorothiazide (HYZAAR) 100-25 MG tablet Take 1 tablet by mouth daily. In am  . metFORMIN (GLUCOPHAGE) 500 MG tablet 1 pill daily in the am with food  . Omega-3 Fatty Acids (FISH OIL) 1000 MG CAPS Take 1,000 mg by mouth 2 (two) times daily.   Marland Kitchen omeprazole (PRILOSEC) 20 MG capsule Take 1 capsule (20 mg total) by mouth daily. 30 min before food  . UNABLE TO FIND Med Name: 0.3% Nifedipine + 1.5% Lidocaine in [facility]- Place on Anal area TID. (Patient not taking: Reported on 11/24/2019)   No current facility-administered medications on file prior to visit.     No Known Allergies  Review Of Systems:  Constitutional:   No  weight loss, night sweats,  Fevers, chills, fatigue, or  lassitude.  HEENT:   No headaches,  Difficulty swallowing,  Tooth/dental problems, or  Sore throat,                No sneezing, itching, ear ache, nasal congestion, post nasal drip,   CV:  No chest pain,  Orthopnea, PND, swelling in lower extremities, anasarca, dizziness, palpitations, syncope.   GI  No heartburn, indigestion, abdominal pain, nausea, vomiting, diarrhea, change in bowel habits, loss of appetite, bloody stools.   Resp: + shortness of breath with exertion only, not  at rest.  No excess mucus, no productive cough,  No non-productive cough,  No coughing up of blood.  No change in color of mucus.  No wheezing.  No chest wall deformity  Skin: no rash or lesions.  GU: no dysuria,  change in color of urine, no urgency or frequency.  No flank pain, no hematuria   MS:  No joint pain or swelling.  No decreased range of motion.  No back pain.  Psych:  No change in mood or affect. No depression or anxiety.  No memory loss.   Vital Signs BP (!) 150/80 (BP Location: Right Arm, Cuff Size: Large)   Pulse 70   Temp (!) 97.5 F (36.4 C) (Oral)   Ht 5\' 4"  (1.626 m)   Wt 255 lb (115.7 kg)   SpO2 95%   BMI 43.77 kg/m  Physical Exam:  General- No distress,  A&Ox3, pleasant ENT: No sinus tenderness, TM clear, pale nasal mucosa, no oral exudate,no post nasal drip, no LAN Cardiac: S1, S2, regular rate and rhythm, no murmur Chest: No wheeze/ rales/ dullness; no accessory muscle use, no nasal flaring, no sternal retractions Abd.: Soft Non-tender, ND, BS +, Body mass index is 43.77 kg/m. Ext: No clubbing cyanosis, Trace bilateral ankle edema Neuro:  normal strength, MAE x 4, A&O x 3, appropriate Skin: No rashes, No lesions, warm and dry Psych: normal mood and behavior   Assessment/Plan  OSA on CPAP Excellent Compliance Excellent Control Needs new machine as hers in > 20 years old. Patient Instructions  Continue on CPAP At bedtime  Your sleep apnea is well controlled, and you are benefiting from therapy.  Keep up good work  Continue on CPAP at bedtime. You appear to be benefiting from the treatment  Goal is to wear for at least 6 hours each night for maximal clinical benefit. Continue to work on weight loss, as the link between excess weight  and sleep apnea is well established.   Remember to establish a good bedtime routine, and work on sleep hygiene.  Limit daytime naps , avoid stimulants such as caffeine and nicotine close to bedtime, exercise daily to promote sleep quality, avoid heavy , spicy, fried , or rich foods before bed. Ensure adequate exposure to natural light during the day,establish a relaxing bedtime routine with a pleasant sleep environment ( Bedroom  between 60 and 67 degrees, turn off bright lights , TV or device screens screens , consider black out curtains or white noise machines) Do not drive if sleepy. Remember to clean mask, tubing, filter, and reservoir once weekly with soapy water.  Follow up with Dr. Mortimer Fries   In 2 or before as needed.  ( Will need down Load within 90 days of getting new device)   This appointment was 30 min long with over 50% of the time in direct face-to-face patient care, assessment, plan of care, and follow-up.    Magdalen Spatz, NP 11/24/2019  9:52 AM

## 2019-11-24 NOTE — Patient Instructions (Addendum)
It was good to see you today. Please follow up with Dr. Aundra Dubin, as your BP was a bit elevated today in the office.  We will place an order for a home sleep study. This is required to replace your CPAP machine.  We will call you when a machine is available. Once the results have been reviewed, we will order your new machine. Continue on CPAP at bedtime. You appear to be benefiting from the treatment  Goal is to wear for at least 6 hours each night for maximal clinical benefit. Continue to work on weight loss, as the link between excess weight  and sleep apnea is well established.   Remember to establish a good bedtime routine, and work on sleep hygiene.  Limit daytime naps , avoid stimulants such as caffeine and nicotine close to bedtime, exercise daily to promote sleep quality, avoid heavy , spicy, fried , or rich foods before bed. Ensure adequate exposure to natural light during the day,establish a relaxing bedtime routine with a pleasant sleep environment ( Bedroom between 60 and 67 degrees, turn off bright lights , TV or device screens screens , consider black out curtains or white noise machines) Do not drive if sleepy. Remember to clean mask, tubing, filter, and reservoir once weekly with soapy water.  Follow up with Dr. Mortimer Fries  In 2 months   or before as needed.( Will need down Load within 90 days of getting new device)    Please contact office for sooner follow up if symptoms do not improve or worsen or seek emergency care

## 2019-12-29 ENCOUNTER — Other Ambulatory Visit: Payer: Self-pay

## 2019-12-29 ENCOUNTER — Ambulatory Visit: Payer: Medicare HMO

## 2019-12-29 DIAGNOSIS — G4733 Obstructive sleep apnea (adult) (pediatric): Secondary | ICD-10-CM

## 2020-01-03 DIAGNOSIS — G4733 Obstructive sleep apnea (adult) (pediatric): Secondary | ICD-10-CM

## 2020-01-12 ENCOUNTER — Telehealth: Payer: Self-pay | Admitting: Acute Care

## 2020-01-12 NOTE — Telephone Encounter (Signed)
Spoke with the pt's daughter  She transports pt's to appts  I have her scheduled for 01/21/20 at 4:30  She states that it will be hard for her to get her here then  Could not take any other appt offered  She wonders if this can be made a telephone visit  If not, will find a way to make this work  Please advise, thanks!

## 2020-01-12 NOTE — Telephone Encounter (Signed)
This patient needs a follow up appointment with me to go over results. Thanks so much

## 2020-01-12 NOTE — Telephone Encounter (Signed)
-----   Message from Chesley Mires, MD sent at 01/03/2020 10:22 AM EDT ----- Home sleep study from 12/29/19 showed mild obstructive sleep apnea with AHI of 12 and SpO2 low of 68%.  Thanks.  [place]

## 2020-01-13 NOTE — Telephone Encounter (Signed)
Spoke with the pt's daughter and notified visit 01/21/20 will be televisit and verified pt's preferred number. Nothing further needed.

## 2020-01-13 NOTE — Telephone Encounter (Signed)
Telephone visit is fine. Thanks so much

## 2020-01-14 ENCOUNTER — Other Ambulatory Visit: Payer: Self-pay

## 2020-01-14 ENCOUNTER — Telehealth (INDEPENDENT_AMBULATORY_CARE_PROVIDER_SITE_OTHER): Payer: Medicare HMO | Admitting: Internal Medicine

## 2020-01-14 ENCOUNTER — Encounter: Payer: Self-pay | Admitting: Internal Medicine

## 2020-01-14 VITALS — BP 135/74 | HR 53 | Ht 64.0 in | Wt 255.0 lb

## 2020-01-14 DIAGNOSIS — M65332 Trigger finger, left middle finger: Secondary | ICD-10-CM

## 2020-01-14 DIAGNOSIS — R202 Paresthesia of skin: Secondary | ICD-10-CM | POA: Diagnosis not present

## 2020-01-14 DIAGNOSIS — I10 Essential (primary) hypertension: Secondary | ICD-10-CM

## 2020-01-14 DIAGNOSIS — E669 Obesity, unspecified: Secondary | ICD-10-CM

## 2020-01-14 DIAGNOSIS — K219 Gastro-esophageal reflux disease without esophagitis: Secondary | ICD-10-CM | POA: Diagnosis not present

## 2020-01-14 DIAGNOSIS — E1169 Type 2 diabetes mellitus with other specified complication: Secondary | ICD-10-CM

## 2020-01-14 DIAGNOSIS — K30 Functional dyspepsia: Secondary | ICD-10-CM | POA: Diagnosis not present

## 2020-01-14 DIAGNOSIS — E119 Type 2 diabetes mellitus without complications: Secondary | ICD-10-CM | POA: Insufficient documentation

## 2020-01-14 DIAGNOSIS — E1159 Type 2 diabetes mellitus with other circulatory complications: Secondary | ICD-10-CM

## 2020-01-14 DIAGNOSIS — R946 Abnormal results of thyroid function studies: Secondary | ICD-10-CM

## 2020-01-14 DIAGNOSIS — I152 Hypertension secondary to endocrine disorders: Secondary | ICD-10-CM

## 2020-01-14 HISTORY — DX: Type 2 diabetes mellitus with other circulatory complications: E11.69

## 2020-01-14 HISTORY — DX: Obesity, unspecified: I15.2

## 2020-01-14 NOTE — Progress Notes (Signed)
Telephone Note  I connected with Taylor Shaw  on 01/14/20 at  9:15 AM EDT by telephone and verified that I am speaking with the correct person using two identifiers. Location patient: home Location provider:work or home office Persons participating in the virtual visit: patient, provider  I discussed the limitations of evaluation and management by telemedicine and the availability of in person appointments. The patient expressed understanding and agreed to proceed.   HPI: 1. GERD and indigestion at times on prilosec 20 mg declines change in therapy  2. HTN controlled norvasc 10, hyzaar 100-25 mg qd, coreg 25 mg bid, hydralazine 10 prn  3. Tingling in hands and left middle finger trigger finger declines ortho referral denies neck and shoulder pain   ROS: See pertinent positives and negatives per HPI. Denies constipation on metamucil/Miralax    Past Medical History:  Diagnosis Date  . Arthritis   . Diabetes ([place]) 02/19/2015  . Diverticulosis 12/07/2016   Sigmoid Colon  . Diverticulosis   . Diverticulosis of sigmoid colon 12/08/2016  . Elevated blood sugar 01/15/2015  . GERD (gastroesophageal reflux disease)   . Hemorrhoids   . History of blood transfusion   . Hyperlipidemia   . Hypertension   . Hypertension 01/15/2015  . Obesity 01/15/2015  . OSA on CPAP   . Sleep apnea     Past Surgical History:  Procedure Laterality Date  . BOTOX INJECTION N/A 03/27/2018   Procedure: BOTOX INJECTION;  Surgeon: Jules Husbands, MD;  Location: [facility];  Service: General;  Laterality: N/A;  . COLONOSCOPY    . COLONOSCOPY WITH PROPOFOL N/A 12/07/2016   Procedure: COLONOSCOPY WITH PROPOFOL;  Surgeon: [name], MD;  Location: [place];  Service: Endoscopy;  Laterality: N/A;  Diabetic - oral meds  . EVALUATION UNDER ANESTHESIA WITH ANAL FISSUROTOMY N/A 03/27/2018   Procedure: EXAM UNDER ANESTHESIA, CHEMICAL SPHINCTEROTOMY;  Surgeon: Jules Husbands, MD;  Location: [facility];  Service:  General;  Laterality: N/A;  . HEMORRHOID SURGERY N/A 06/13/2018   Procedure: [id number];  Surgeon: Jules Husbands, MD;  Location: [facility];  Service: General;  Laterality: N/A;  . JOINT REPLACEMENT     b/l hips in [place] 2004 and 2005 Dr. Alfonso Ramus   . POLYPECTOMY  12/07/2016   Procedure: POLYPECTOMY;  Surgeon: [name], MD;  Location: [place];  Service: Endoscopy;;  . RECTAL EXAM UNDER ANESTHESIA N/A 06/13/2018   Procedure: RECTAL EXAM UNDER ANESTHESIA;  Surgeon: Jules Husbands, MD;  Location: [facility];  Service: General;  Laterality: N/A;  . TOTAL HIP ARTHROPLASTY Bilateral   . TUBAL LIGATION      Family History  Problem Relation Age of Onset  . Dementia Mother   . Hypertension Mother   . Arthritis Mother   . Alcohol abuse Father   . Cirrhosis Father   . Cancer Daughter        breast  . COPD Brother   . Breast cancer Sister 33  . Cancer Sister        ? type   . Dementia Sister   . Cancer Brother        ?type  . Cancer Brother        cancer ?type  . Cancer Daughter        cancer ?type died 89   . Alzheimer's disease Sister     SOCIAL HX:  Lives with daughter [name] to go to church  Moved from [place] 3-4 years ago  No guns, wears seat belt  Never smoker    Current Outpatient Medications:  .  amLODipine (NORVASC) 10 MG tablet, Take 1 tablet (10 mg total) by mouth daily., Disp: 90 tablet, Rfl: 3 .  atorvastatin (LIPITOR) 10 MG tablet, Take 1 tablet (10 mg total) by mouth daily at 6 PM., Disp: 90 tablet, Rfl: 3 .  Calcium Carb-Cholecalciferol (CALCIUM 600+D) 600-800 MG-UNIT TABS, Take 1 tablet by mouth 2 (two) times daily., Disp: , Rfl:  .  carvedilol (COREG) 25 MG tablet, Take 1 tablet (25 mg total) by mouth 2 (two) times daily with a meal., Disp: 180 tablet, Rfl: 3 .  docusate sodium (COLACE) 100 MG capsule, Take 100 mg by mouth 2 (two) times daily as needed., Disp: , Rfl:  .  hydrALAZINE (APRESOLINE) 10 MG tablet, Take 1 tablet (10 mg  total) by mouth 2 (two) times daily as needed. Take if BP >140/>80, Disp: 180 tablet, Rfl: 3 .  losartan-hydrochlorothiazide (HYZAAR) 100-25 MG tablet, Take 1 tablet by mouth daily. In am, Disp: 90 tablet, Rfl: 3 .  metFORMIN (GLUCOPHAGE) 500 MG tablet, 1 pill daily in the am with food, Disp: 90 tablet, Rfl: 3 .  Omega-3 Fatty Acids (FISH OIL) 1000 MG CAPS, Take 1,000 mg by mouth 2 (two) times daily. , Disp: , Rfl:  .  omeprazole (PRILOSEC) 20 MG capsule, Take 1 capsule (20 mg total) by mouth daily. 30 min before food, Disp: 90 capsule, Rfl: 3  EXAM:  VITALS per patient if applicable:  GENERAL: alert, oriented, appears well and in no acute distress  PSYCH/NEURO: pleasant and cooperative, no obvious depression or anxiety, speech and thought processing grossly intact  ASSESSMENT AND PLAN:  Discussed the following assessment and plan:  Gastroesophageal reflux disease without esophagitis/indigestion Cont prilosec 20 mg qd  Prn Gas X  Essential hypertension - Plan: Comprehensive metabolic panel, Lipid panel, CBC with Differential/Platelet Cont meds   Hypertension associated with diabetes ([place]) - Plan: Hemoglobin A1c Obesity, diabetes, and hypertension syndrome ([place]) - Plan: Comprehensive metabolic panel, Lipid panel, Hemoglobin A1c -cont meds  -BP check with labs upcoming  Abnormal thyroid function test - Plan: TSH  Hand tingling ? CTS and left middle finger  Consider hand ortho in the future call back to schedule   HM Flu shotutd Tdap had 07/04/2018 pna 23 had 10/12/15will pna 23 06/2019 prevnarutd Had zostervax, shingrix had 2/2 2/2/ covid   Mammogram 4/1/21negative Out of age window pap  Colonoscopy had 12/07/16 IH, diverticulosis and tubular adenoma f/u in 5 years Dr. Allen Norris  DEXA get report prior [facility]CP Dr. Quillian [facility] per pt had 5 years ago -consider repeatno record   OSA f/u pulm, p[facility] sch 11/12/2018 and f/u pulm 11/19/2018  -we discussed possible  serious and likely etiologies, options for evaluation and workup, limitations of telemedicine visit vs in person visit, treatment, treatment risks and precautions. Pt prefers to treat via telemedicine empirically rather then risking or undertaking an in person visit at this moment. Patient agrees to seek prompt in person care if worsening, new symptoms arise, or if is not improving with treatment.   I discussed the assessment and treatment plan with the patient. The patient was provided an opportunity to ask questions and all were answered. The patient agreed with the plan and demonstrated an understanding of the instructions.   The patient was advised to call back or seek an in-person evaluation if the symptoms worsen or if the condition fails to improve as anticipated.  Time spent 20 min Delorise Jackson, MD

## 2020-01-14 NOTE — Patient Instructions (Addendum)
Trigger Finger  Trigger finger, also called stenosing tenosynovitis,  is a condition that causes a finger to get stuck in a bent position. Each finger has a tendon, which is a tough, cord-like tissue that connects muscle to bone, and each tendon passes through a tunnel of tissue called a tendon sheath. To move your finger, your tendon needs to glide freely through the sheath. Trigger finger happens when the tendon or the sheath thickens, making it difficult to move your finger. Trigger finger can affect any finger or a thumb. It may affect more than one finger. Mild cases may clear up with rest and medicine. Severe cases require more treatment. What are the causes? Trigger finger is caused by a thickened finger tendon or tendon sheath. The cause of this thickening is not known. What increases the risk? The following factors may make you more likely to develop this condition:  Doing activities that require a strong grip.  Having rheumatoid arthritis, gout, or diabetes.  Being 78-71 years old.  Being female. What are the signs or symptoms? Symptoms of this condition include:  Pain when bending or straightening your finger.  Tenderness or swelling where your finger attaches to the palm of your hand.  A lump in the palm of your hand or on the inside of your finger.  Hearing a noise like a pop or a snap when you try to straighten your finger.  Feeling a catching or locking sensation when you try to straighten your finger.  Being unable to straighten your finger. How is this diagnosed? This condition is diagnosed based on your symptoms and a physical exam. How is this treated? This condition may be treated by:  Resting your finger and avoiding activities that make symptoms worse.  Wearing a finger splint to keep your finger extended.  Taking NSAIDs, such as ibuprofen, to relieve pain and swelling.  Doing gentle exercises to stretch the finger as told by your health care  provider.  Having medicine that reduces swelling and inflammation (steroids) injected into the tendon sheath. Injections may need to be repeated.  Having surgery to open the tendon sheath. This may be done if other treatments do not work and you cannot straighten your finger. You may need physical therapy after surgery. Follow these instructions at home: If you have a splint:  Wear the splint as told by your health care provider. Remove it only as told by your health care provider.  Loosen it if your fingers tingle, become numb, or turn cold and blue.  Keep it clean.  If the splint is not waterproof: ? Do not let it get wet. ? Cover it with a watertight covering when you take a bath or shower. Managing pain, stiffness, and swelling     If directed, apply heat to the affected area as often as told by your health care provider. Use the heat source that your health care provider recommends, such as a moist heat pack or a heating pad.  Place a towel between your skin and the heat source.  Leave the heat on for 20-30 minutes.  Remove the heat if your skin turns bright red. This is especially important if you are unable to feel pain, heat, or cold. You may have a greater risk of getting burned. If directed, put ice on the painful area. To do this:  If you have a removable splint, remove it as told by your health care provider.  Put ice in a plastic bag.  Place a  towel between your skin and the bag or between your splint and the bag.  Leave the ice on for 20 minutes, 2-3 times a day.  Activity  Rest your finger as told by your health care provider. Avoid activities that make the pain worse.  Return to your normal activities as told by your health care provider. Ask your health care provider what activities are safe for you.  Do exercises as told by your health care provider.  Ask your health care provider when it is safe to drive if you have a splint on your hand. General  instructions  Take over-the-counter and prescription medicines only as told by your health care provider.  Keep all follow-up visits as told by your health care provider. This is important. Contact a health care provider if:  Your symptoms are not improving with home care. Summary  Trigger finger, also called stenosing tenosynovitis, causes your finger to get stuck in a bent position. This can make it difficult and painful to straighten your finger.  This condition develops when a finger tendon or tendon sheath thickens.  Treatment may include resting your finger, wearing a splint, and taking medicines.  In severe cases, surgery to open the tendon sheath may be needed. This information is not intended to replace advice given to you by your health care provider. Make sure you discuss any questions you have with your health care provider. Document Revised: 10/07/2018 Document Reviewed: 10/07/2018 Elsevier Patient Education  2020 [place] for Gastroesophageal Reflux Disease, Adult When you have gastroesophageal reflux disease (GERD), the foods you eat and your eating habits are very important. Choosing the right foods can help ease the discomfort of GERD. Consider working with a diet and nutrition specialist (dietitian) to help you make healthy food choices. What general guidelines should I follow?  Eating plan  Choose healthy foods low in fat, such as fruits, vegetables, whole grains, low-fat dairy products, and lean meat, fish, and poultry.  Eat frequent, small meals instead of three large meals each day. Eat your meals slowly, in a relaxed setting. Avoid bending over or lying down until 2-3 hours after eating.  Limit high-fat foods such as fatty meats or fried foods.  Limit your intake of oils, butter, and shortening to less than 8 teaspoons each day.  Avoid the following: ? Foods that cause symptoms. These may be different for different people. Keep a food  diary to keep track of foods that cause symptoms. ? Alcohol. ? Drinking large amounts of liquid with meals. ? Eating meals during the 2-3 hours before bed.  Cook foods using methods other than frying. This may include baking, grilling, or broiling. Lifestyle  Maintain a healthy weight. Ask your health care provider what weight is healthy for you. If you need to lose weight, work with your health care provider to do so safely.  Exercise for at least 30 minutes on 5 or more days each week, or as told by your health care provider.  Avoid wearing clothes that fit tightly around your waist and chest.  Do not use any products that contain nicotine or tobacco, such as cigarettes and e-cigarettes. If you need help quitting, ask your health care provider.  Sleep with the head of your bed raised. Use a wedge under the mattress or blocks under the bed frame to raise the head of the bed. What foods are not recommended? The items listed may not be a complete list. Talk with  your dietitian about what dietary choices are best for you. Grains Pastries or quick breads with added fat. [place] toast. Vegetables Deep fried vegetables. [place] fries. Any vegetables prepared with added fat. Any vegetables that cause symptoms. For some people this may include tomatoes and tomato products, chili peppers, onions and garlic, and horseradish. Fruits Any fruits prepared with added fat. Any fruits that cause symptoms. For some people this may include citrus fruits, such as oranges, grapefruit, pineapple, and lemons. Meats and other protein foods High-fat meats, such as fatty beef or pork, hot dogs, ribs, ham, sausage, salami and bacon. Fried meat or protein, including fried fish and fried chicken. Nuts and nut butters. Dairy Whole milk and chocolate milk. Sour cream. Cream. Ice cream. Cream cheese. Milk shakes. Beverages Coffee and tea, with or without caffeine. Carbonated beverages. Sodas. Energy drinks. Fruit juice  made with acidic fruits (such as orange or grapefruit). Tomato juice. Alcoholic drinks. Fats and oils Butter. Margarine. Shortening. Ghee. Sweets and desserts Chocolate and cocoa. Donuts. Seasoning and other foods Pepper. Peppermint and spearmint. Any condiments, herbs, or seasonings that cause symptoms. For some people, this may include curry, hot sauce, or vinegar-based salad dressings. Summary  When you have gastroesophageal reflux disease (GERD), food and lifestyle choices are very important to help ease the discomfort of GERD.  Eat frequent, small meals instead of three large meals each day. Eat your meals slowly, in a relaxed setting. Avoid bending over or lying down until 2-3 hours after eating.  Limit high-fat foods such as fatty meat or fried foods. This information is not intended to replace advice given to you by your health care provider. Make sure you discuss any questions you have with your health care provider. Document Revised: 09/12/2018 Document Reviewed: 05/23/2016 Elsevier Patient Education  [place].

## 2020-01-14 NOTE — Progress Notes (Signed)
Patient presenting with tingling in the hands on and off. On the left hand her middle finger will be stiff. Onset of over a month ago.

## 2020-01-20 ENCOUNTER — Telehealth: Payer: Self-pay | Admitting: Acute Care

## 2020-01-20 NOTE — Telephone Encounter (Signed)
Can we please get this patient scheduled for a telephone visit with me to review her sleep study results and give her options for treatment? Thanks so much   Chesley Mires, MD  Magdalen Spatz, NP Home sleep study from 12/29/19 showed mild obstructive sleep apnea with AHI of 12 and SpO2 low of 68%.   Thanks.   [place]

## 2020-01-20 NOTE — Telephone Encounter (Signed)
Follow up appointment has already been made for 01/21/2020.

## 2020-01-21 ENCOUNTER — Ambulatory Visit (INDEPENDENT_AMBULATORY_CARE_PROVIDER_SITE_OTHER): Payer: Medicare HMO | Admitting: Acute Care

## 2020-01-21 ENCOUNTER — Encounter: Payer: Self-pay | Admitting: Acute Care

## 2020-01-21 ENCOUNTER — Other Ambulatory Visit: Payer: Self-pay

## 2020-01-21 DIAGNOSIS — G4733 Obstructive sleep apnea (adult) (pediatric): Secondary | ICD-10-CM | POA: Diagnosis not present

## 2020-01-21 DIAGNOSIS — Z9989 Dependence on other enabling machines and devices: Secondary | ICD-10-CM

## 2020-01-21 NOTE — Addendum Note (Signed)
Addended byCoralie Keens on: 01/21/2020 05:29 PM   Modules accepted: Orders

## 2020-01-21 NOTE — Patient Instructions (Signed)
We will order a new CPAP machine through [facility] settings of 16 cm H2O Once you have your new machine we will do a 2 month follow up with down Load to assure effectiveness of treatment We will also order an overnight oxygen study at that time to ensure CPAP therapy has resolved the drops on your oxygen levels.  Continue on CPAP at bedtime. You appear to be benefiting from the treatment  Goal is to wear for at least 6 hours each night for maximal clinical benefit. Continue to work on weight loss, as the link between excess weight  and sleep apnea is well established.   Remember to establish a good bedtime routine, and work on sleep hygiene.  Limit daytime naps , avoid stimulants such as caffeine and nicotine close to bedtime, exercise daily to promote sleep quality, avoid heavy , spicy, fried , or rich foods before bed. Ensure adequate exposure to natural light during the day,establish a relaxing bedtime routine with a pleasant sleep environment ( Bedroom between 60 and 67 degrees, turn off bright lights , TV or device screens screens , consider black out curtains or white noise machines) Do not drive if sleepy. Remember to clean mask, tubing, filter, and reservoir once weekly with soapy water.  Follow up with  Judson Roch NP or Dr. Mortimer Fries  In 2 months  or before as needed.

## 2020-01-21 NOTE — Progress Notes (Signed)
Virtual Visit via Telephone Note  I connected with Taylor Shaw on 01/21/20 at  4:30 PM EDT by telephone and verified that I am speaking with the correct person using two identifiers.  Location: Patient: at home  Provider: [place], [place], [place], [address]   I discussed the limitations, risks, security and privacy concerns of performing an evaluation and management service by telephone and the availability of in person appointments. I also discussed with the patient that there may be a patient responsible charge related to this service. The patient expressed understanding and agreed to proceed.   History of Present Illness: Pt. Presents for follow up of her home sleep test. She had a sleep test repeated for a new cpap device as her current CPAP machine is so old. Home Sleep Test confirmed that she has mild OSA with an AHI of 12, and nocturnal desaturations to 68% when she is not using her CPAP. This should qualify her for her new machine. We will do an O&O once she has her new device to ensure the desaturations are resolved with CPAP use. If they are not, we will order oxygen for  Bleed into her CPAP machine. She denies any daytime sleepiness or morning headaches when she uses her therapy. She is compliant 29/30 days for 8 plus hours per night. She needs a new machine as her current machine as parts are beginning to get loose.    Observations/Objective: Home sleep study from 12/29/19 showed mild obstructive sleep apnea with AHI of 12 and SpO2 low of 68%.  Assessment and Plan: OSA on CPAP New Home Sleep Study off CPAP confirms continued mild OSA with desats to 68% off CPAP. Current CPAP device is > 5 years old.  Plan We will order a new CPAP machine through [facility] settings of 16 cm H2O Once you have your new machine we will do a 2 month follow up with down Load to assure effectiveness of treatment We will also order an overnight oxygen study at that time to ensure  CPAP therapy has resolved the drops on your oxygen levels.  Continue on CPAP at bedtime. You appear to be benefiting from the treatment  Goal is to wear for at least 6 hours each night for maximal clinical benefit. Continue to work on weight loss, as the link between excess weight  and sleep apnea is well established.   Remember to establish a good bedtime routine, and work on sleep hygiene.  Limit daytime naps , avoid stimulants such as caffeine and nicotine close to bedtime, exercise daily to promote sleep quality, avoid heavy , spicy, fried , or rich foods before bed. Ensure adequate exposure to natural light during the day,establish a relaxing bedtime routine with a pleasant sleep environment ( Bedroom between 60 and 67 degrees, turn off bright lights , TV or device screens screens , consider black out curtains or white noise machines) Do not drive if sleepy. Remember to clean mask, tubing, filter, and reservoir once weekly with soapy water.  Follow up with  Judson Roch NP   In 2 months  or before as needed.    Follow Up Instructions: Follow up in 2 months with down Load with Dr. Mortimer Fries or Judson Roch NP   I discussed the assessment and treatment plan with the patient. The patient was provided an opportunity to ask questions and all were answered. The patient agreed with the plan and demonstrated an understanding of the instructions.   The patient was  advised to call back or seek an in-person evaluation if the symptoms worsen or if the condition fails to improve as anticipated.  I provided 30 minutes of non-face-to-face time during this encounter.   Magdalen Spatz, NP 01/21/2020 5:03 PM

## 2020-02-05 ENCOUNTER — Ambulatory Visit: Payer: Medicare HMO

## 2020-02-05 ENCOUNTER — Other Ambulatory Visit (INDEPENDENT_AMBULATORY_CARE_PROVIDER_SITE_OTHER): Payer: Medicare HMO

## 2020-02-05 ENCOUNTER — Other Ambulatory Visit: Payer: Self-pay

## 2020-02-05 VITALS — BP 152/82 | HR 69

## 2020-02-05 DIAGNOSIS — E669 Obesity, unspecified: Secondary | ICD-10-CM | POA: Diagnosis not present

## 2020-02-05 DIAGNOSIS — I10 Essential (primary) hypertension: Secondary | ICD-10-CM

## 2020-02-05 DIAGNOSIS — E1169 Type 2 diabetes mellitus with other specified complication: Secondary | ICD-10-CM | POA: Diagnosis not present

## 2020-02-05 DIAGNOSIS — R946 Abnormal results of thyroid function studies: Secondary | ICD-10-CM | POA: Diagnosis not present

## 2020-02-05 DIAGNOSIS — E1159 Type 2 diabetes mellitus with other circulatory complications: Secondary | ICD-10-CM

## 2020-02-05 DIAGNOSIS — Z0130 Encounter for examination of blood pressure without abnormal findings: Secondary | ICD-10-CM

## 2020-02-05 LAB — COMPREHENSIVE METABOLIC PANEL
ALT: 7 U/L (ref 0–35)
AST: 13 U/L (ref 0–37)
Albumin: 4 g/dL (ref 3.5–5.2)
Alkaline Phosphatase: 60 U/L (ref 39–117)
BUN: 12 mg/dL (ref 6–23)
CO2: 33 mEq/L — ABNORMAL HIGH (ref 19–32)
Calcium: 9.5 mg/dL (ref 8.4–10.5)
Chloride: 99 mEq/L (ref 96–112)
Creatinine, Ser: 0.8 mg/dL (ref 0.40–1.20)
GFR: 83.96 mL/min (ref >60.00)
Glucose, Bld: 104 mg/dL — ABNORMAL HIGH (ref 70–99)
Potassium: 3.8 mEq/L (ref 3.5–5.1)
Sodium: 139 mEq/L (ref 135–145)
Total Bilirubin: 0.7 mg/dL (ref 0.2–1.2)
Total Protein: 6.8 g/dL (ref 6.0–8.3)

## 2020-02-05 LAB — CBC WITH DIFFERENTIAL/PLATELET
Basophils Absolute: 0.1 10*3/uL (ref 0.0–0.1)
Basophils Relative: 1.1 % (ref 0.0–3.0)
Eosinophils Absolute: 0.2 10*3/uL (ref 0.0–0.7)
Eosinophils Relative: 2.6 % (ref 0.0–5.0)
HCT: 37.8 % (ref 36.0–46.0)
Hemoglobin: 12.1 g/dL (ref 12.0–15.0)
Lymphocytes Relative: 30.1 % (ref 12.0–46.0)
Lymphs Abs: 2.7 10*3/uL (ref 0.7–4.0)
MCHC: 32 g/dL (ref 30.0–36.0)
MCV: 81.2 fl (ref 78.0–100.0)
Monocytes Absolute: 0.7 10*3/uL (ref 0.1–1.0)
Monocytes Relative: 7.5 % (ref 3.0–12.0)
Neutro Abs: 5.3 10*3/uL (ref 1.4–7.7)
Neutrophils Relative %: 58.7 % (ref 43.0–77.0)
Platelets: 234 10*3/uL (ref 150.0–400.0)
RBC: 4.66 Mil/uL (ref 3.87–5.11)
RDW: 13.7 % (ref 11.5–15.5)
WBC: 9.1 10*3/uL (ref 4.0–10.5)

## 2020-02-05 LAB — HEMOGLOBIN A1C: Hgb A1c MFr Bld: 6.4 % (ref 4.6–6.5)

## 2020-02-05 LAB — LIPID PANEL
Cholesterol: 140 mg/dL (ref 0–200)
HDL: 54.9 mg/dL (ref >39.00)
LDL Cholesterol: 68 mg/dL (ref 0–99)
NonHDL: 85.22
Total CHOL/HDL Ratio: 3
Triglycerides: 88 mg/dL (ref 0.0–149.0)
VLDL: 17.6 mg/dL (ref 0.0–40.0)

## 2020-02-05 LAB — TSH: TSH: 8.18 u[IU]/mL — ABNORMAL HIGH (ref 0.35–4.50)

## 2020-02-05 NOTE — Progress Notes (Signed)
Patient here for nurse visit BP check per order from Dr Olivia Mackie McLean-Scocuzza.   Patient reports compliance with prescribed BP medications: yes  Last dose of BP medication: She has taken her Blood Pressure medication on 02/04/20. She states that she takes her medication daily and has not taken her medication before her Blood Pressure Check.  BP Readings from Last 3 Encounters:  01/14/20 135/74  11/24/19 (!) 150/80  10/14/19 128/80   Pulse Readings from Last 3 Encounters:  01/14/20 (!) 53  11/24/19 70  10/14/19 61    Per Dr. Olivia Mackie McLean-Scocuzza, -BP check with labs upcoming Blood Pressure: 152/82 Oxygen: 97% Pulse: 69  Patient verbalized understanding of instructions.   [place], CMA

## 2020-02-15 ENCOUNTER — Other Ambulatory Visit: Payer: Self-pay | Admitting: Internal Medicine

## 2020-02-15 DIAGNOSIS — E039 Hypothyroidism, unspecified: Secondary | ICD-10-CM | POA: Insufficient documentation

## 2020-02-15 MED ORDER — LEVOTHYROXINE SODIUM 25 MCG PO TABS: 25.0000 ug | ORAL_TABLET | Freq: Every day | ORAL | 3 refills | Status: DC

## 2020-02-15 NOTE — Progress Notes (Signed)
Added levo 25 mcg in am  Recheck TSH in 2-3 months

## 2020-02-17 ENCOUNTER — Other Ambulatory Visit: Payer: Self-pay

## 2020-02-17 DIAGNOSIS — E039 Hypothyroidism, unspecified: Secondary | ICD-10-CM

## 2020-02-25 ENCOUNTER — Other Ambulatory Visit: Payer: Self-pay | Admitting: Internal Medicine

## 2020-02-25 DIAGNOSIS — I10 Essential (primary) hypertension: Secondary | ICD-10-CM

## 2020-02-25 DIAGNOSIS — E785 Hyperlipidemia, unspecified: Secondary | ICD-10-CM

## 2020-02-25 DIAGNOSIS — E119 Type 2 diabetes mellitus without complications: Secondary | ICD-10-CM

## 2020-02-25 DIAGNOSIS — K219 Gastro-esophageal reflux disease without esophagitis: Secondary | ICD-10-CM

## 2020-02-25 MED ORDER — ATORVASTATIN CALCIUM 10 MG PO TABS: 10.0000 mg | ORAL_TABLET | Freq: Every day | ORAL | 3 refills | Status: DC

## 2020-02-25 MED ORDER — BLOOD GLUCOSE METER KIT: PACK | 0 refills | Status: DC

## 2020-02-25 MED ORDER — AMLODIPINE BESYLATE 10 MG PO TABS: 10.0000 mg | ORAL_TABLET | Freq: Every day | ORAL | 3 refills | Status: DC

## 2020-02-25 MED ORDER — HYDRALAZINE HCL 10 MG PO TABS: 10.0000 mg | ORAL_TABLET | Freq: Two times a day (BID) | ORAL | 3 refills | Status: DC | PRN

## 2020-02-25 MED ORDER — OMEPRAZOLE 20 MG PO CPDR: 20.0000 mg | DELAYED_RELEASE_CAPSULE | Freq: Every day | ORAL | 3 refills | Status: DC

## 2020-02-25 MED ORDER — METFORMIN HCL 500 MG PO TABS: ORAL_TABLET | ORAL | 3 refills | Status: DC

## 2020-02-26 ENCOUNTER — Other Ambulatory Visit: Payer: Self-pay | Admitting: Internal Medicine

## 2020-02-26 DIAGNOSIS — E039 Hypothyroidism, unspecified: Secondary | ICD-10-CM

## 2020-02-26 DIAGNOSIS — E119 Type 2 diabetes mellitus without complications: Secondary | ICD-10-CM

## 2020-02-26 DIAGNOSIS — I10 Essential (primary) hypertension: Secondary | ICD-10-CM

## 2020-02-26 MED ORDER — LOSARTAN POTASSIUM-HCTZ 100-25 MG PO TABS: 1.0000 | ORAL_TABLET | Freq: Every day | ORAL | 3 refills | Status: DC

## 2020-02-26 MED ORDER — LEVOTHYROXINE SODIUM 25 MCG PO TABS: 25.0000 ug | ORAL_TABLET | Freq: Every day | ORAL | 3 refills | Status: DC

## 2020-02-26 MED ORDER — CARVEDILOL 25 MG PO TABS: 25.0000 mg | ORAL_TABLET | Freq: Two times a day (BID) | ORAL | 3 refills | Status: DC

## 2020-03-01 ENCOUNTER — Telehealth: Payer: Self-pay | Admitting: Internal Medicine

## 2020-03-01 NOTE — Telephone Encounter (Signed)
Faxed  To [facility] for True metrix test strip and trueplus lancets faxed on 03-01-20

## 2020-03-30 DIAGNOSIS — H2513 Age-related nuclear cataract, bilateral: Secondary | ICD-10-CM | POA: Diagnosis not present

## 2020-03-30 LAB — HM DIABETES EYE EXAM

## 2020-04-01 DIAGNOSIS — G4733 Obstructive sleep apnea (adult) (pediatric): Secondary | ICD-10-CM | POA: Diagnosis not present

## 2020-04-04 ENCOUNTER — Encounter: Payer: Self-pay | Admitting: Internal Medicine

## 2020-05-06 ENCOUNTER — Ambulatory Visit (INDEPENDENT_AMBULATORY_CARE_PROVIDER_SITE_OTHER): Payer: Medicare HMO

## 2020-05-06 VITALS — BP 131/69 | HR 63 | Ht 64.0 in | Wt 255.0 lb

## 2020-05-06 DIAGNOSIS — Z0000 Encounter for general adult medical examination without abnormal findings: Secondary | ICD-10-CM

## 2020-05-06 NOTE — Progress Notes (Signed)
Subjective:   Taylor Shaw is a 79 y.o. female who presents for Medicare Annual (Subsequent) preventive examination.  Review of Systems    No ROS.  Medicare Wellness Virtual Visit.     Cardiac Risk Factors include: advanced age (>2mn, >>99women);hypertension;diabetes mellitus     Objective:    Today's Vitals   05/06/20 1105  BP: 131/69  Pulse: 63  Weight: 255 lb (115.7 kg)  Height: 5' 4"  (1.626 m)   Body mass index is 43.77 kg/m.  Advanced Directives 05/06/2020 05/06/2019 06/12/2018 03/27/2018 03/25/2018 12/07/2016 09/14/2016  Does Patient Have a Medical Advance Directive? Yes Yes Yes No No Yes No  Type of [place]of [place] will Living will;Healthcare Power of [place]-  Does patient want to make changes to medical advance directive? No - Patient declined No - Patient declined No - Patient declined - - No - Patient declined -  Copy of HPorterin Chart? No - copy requested No - copy requested No - copy requested - - No - copy requested -  Would patient like information on creating a medical advance directive? - - - - No - Patient declined - No - Patient declined    Current Medications (verified) Outpatient Encounter Medications as of 05/06/2020  Medication Sig  . amLODipine (NORVASC) 10 MG tablet Take 1 tablet (10 mg total) by mouth daily.  .Marland Kitchenatorvastatin (LIPITOR) 10 MG tablet Take 1 tablet (10 mg total) by mouth daily at 6 PM.  . blood glucose meter kit and supplies Dispense based on patient and insurance preference. Use up to four times daily as directed. (FOR ICD-10 E10.9, E11.9).  . Calcium Carb-Cholecalciferol (CALCIUM 600+D) 600-800 MG-UNIT TABS Take 1 tablet by mouth 2 (two) times daily.  . carvedilol (COREG) 25 MG tablet Take 1 tablet (25 mg total) by mouth 2 (two) times daily with a meal.  . docusate sodium (COLACE) 100 MG capsule Take  100 mg by mouth 2 (two) times daily as needed.  . hydrALAZINE (APRESOLINE) 10 MG tablet Take 1 tablet (10 mg total) by mouth 2 (two) times daily as needed. Take if BP >140/>80  . levothyroxine (SYNTHROID) 25 MCG tablet Take 1 tablet (25 mcg total) by mouth daily before breakfast. 30 minutes before food  . losartan-hydrochlorothiazide (HYZAAR) 100-25 MG tablet Take 1 tablet by mouth daily. In am  . metFORMIN (GLUCOPHAGE) 500 MG tablet 1 pill daily in the am with food  . Omega-3 Fatty Acids (FISH OIL) 1000 MG CAPS Take 1,000 mg by mouth 2 (two) times daily.   .Marland Kitchenomeprazole (PRILOSEC) 20 MG capsule Take 1 capsule (20 mg total) by mouth daily. 30 min before food   No facility-administered encounter medications on file as of 05/06/2020.    Allergies (verified) Patient has no known allergies.   History: Past Medical History:  Diagnosis Date  . Arthritis   . Diabetes (H[place]ha 02/19/2015  . Diverticulosis 12/07/2016   Sigmoid Colon  . Diverticulosis   . Diverticulosis of sigmoid colon 12/08/2016  . Elevated blood sugar 01/15/2015  . GERD (gastroesophageal reflux disease)   . Hemorrhoids   . History of blood transfusion   . Hyperlipidemia   . Hypertension   . Hypertension 01/15/2015  . Obesity 01/15/2015  . OSA on CPAP   . Sleep apnea    Past Surgical History:  Procedure Laterality Date  . BOTOX INJECTION N/A 03/27/2018  Procedure: BOTOX INJECTION;  Surgeon: Jules Husbands, MD;  Location: [facility];  Service: General;  Laterality: N/A;  . COLONOSCOPY    . COLONOSCOPY WITH PROPOFOL N/A 12/07/2016   Procedure: COLONOSCOPY WITH PROPOFOL;  Surgeon: [name], MD;  Location: [facility];  Service: Endoscopy;  Laterality: N/A;  Diabetic - oral meds  . EVALUATION UNDER ANESTHESIA WITH ANAL FISSUROTOMY N/A 03/27/2018   Procedure: EXAM UNDER ANESTHESIA, CHEMICAL SPHINCTEROTOMY;  Surgeon: Jules Husbands, MD;  Location: [facility];  Service: General;  Laterality: N/A;  . HEMORRHOID SURGERY N/A  06/13/2018   Procedure: [id number];  Surgeon: Jules Husbands, MD;  Location: [facility];  Service: General;  Laterality: N/A;  . JOINT REPLACEMENT     b/l hips in [place] 2004 and 2005 Dr. Alfonso Ramus   . POLYPECTOMY  12/07/2016   Procedure: POLYPECTOMY;  Surgeon: [name], MD;  Location: [facility];  Service: Endoscopy;;  . RECTAL EXAM UNDER ANESTHESIA N/A 06/13/2018   Procedure: RECTAL EXAM UNDER ANESTHESIA;  Surgeon: Jules Husbands, MD;  Location: [facility];  Service: General;  Laterality: N/A;  . TOTAL HIP ARTHROPLASTY Bilateral   . TUBAL LIGATION     Family History  Problem Relation Age of Onset  . Dementia Mother   . Hypertension Mother   . Arthritis Mother   . Alcohol abuse Father   . Cirrhosis Father   . Cancer Daughter        breast  . COPD Brother   . Breast cancer Sister 74  . Cancer Sister        ? type   . Dementia Sister   . Cancer Brother        ?type  . Cancer Brother        cancer ?type  . Cancer Daughter        cancer ?type died 26   . Alzheimer's disease Sister    Social History   Socioeconomic History  . Marital status: Divorced    Spouse name: Not on file  . Number of children: Not on file  . Years of education: Not on file  . Highest education level: Not on file  Occupational History  . Occupation: Astronomer covers    Comment: retired  Tobacco Use  . Smoking status: Never Smoker  . Smokeless tobacco: Never Used  Vaping Use  . Vaping Use: Never used  Substance and Sexual Activity  . Alcohol use: No    Alcohol/week: 0.0 standard drinks  . Drug use: No  . Sexual activity: Not Currently  Other Topics Concern  . Not on file  Social History Narrative   Lives with daughter [name] to go to church    Moved from [place] 3-4 years ago    No guns, wears seat belt    Never smoker    Social Determinants of Health   Financial Resource Strain: Low Risk   . Difficulty of Paying Living Expenses:  Not hard at all  Food Insecurity: No Food Insecurity  . Worried About Charity fundraiser in the Last Year: Never true  . Ran Out of Food in the Last Year: Never true  Transportation Needs: No Transportation Needs  . Lack of Transportation (Medical): No  . Lack of Transportation (Non-Medical): No  Physical Activity:   . Days of Exercise per Week: Not on file  . Minutes of Exercise per Session: Not on file  Stress: No Stress Concern  Present  . Feeling of Stress : Not at all  Social Connections: Unknown  . Frequency of Communication with Friends and Family: More than three times a week  . Frequency of Social Gatherings with Friends and Family: Not on file  . Attends Religious Services: Not on file  . Active Member of Clubs or Organizations: Not on file  . Attends Archivist Meetings: Not on file  . Marital Status: Not on file    Tobacco Counseling Counseling given: Not Answered   Clinical Intake:  Pre-visit preparation completed: Yes          Nutrition Risk Assessment: Has the patient had any N/V/D within the last 2 months?  No Does the patient have any non-healing wounds?  No  Has the patient had any unintentional weight loss or weight gain?  No   Diabetes: If diabetic, was a CBG obtained today?  No  Did the patient bring in their glucometer from home?  No  How often do you monitor your CBG's? Daughter monitors every so often.   Financial Strains and Diabetes Management: Are you having any financial strains with the device, your supplies or your medication? No .  Does the patient want to be seen by Chronic Care Management for management of their diabetes?  No  Would the patient like to be referred to a Nutritionist or for Diabetic Management?  No   Diabetic Exams: Diabetic Eye Exam: Completed 03/31/20 Diabetic Foot Exam: Completed 10/14/19 How often do you need to have someone help you when you read instructions, pamphlets, or other written materials from  your doctor or pharmacy?: 1 - Never   Interpreter Needed?: No     Activities of Daily Living In your present state of health, do you have any difficulty performing the following activities: 05/06/2020  Hearing? N  Vision? N  Difficulty concentrating or making decisions? N  Walking or climbing stairs? Y  Dressing or bathing? N  Doing errands, shopping? N  Preparing Food and eating ? N  Using the Toilet? N  In the past six months, have you accidently leaked urine? N  Do you have problems with loss of bowel control? N  Managing your Medications? N  Managing your Finances? N  Housekeeping or managing your Housekeeping? N  Some recent data might be hidden    Patient Care Team: McLean-Scocuzza, Nino Glow, MD as PCP - General (Internal Medicine)  Indicate any recent Medical Services you may have received from other than [facility] providers in the past year (date may be approximate).     Assessment:   This is a routine wellness examination for Rozann.  I connected with Cyana today by video and verified that I am speaking with the correct person using two identifiers. Location patient: home Location provider: work Persons participating in the virtual visit: patient, Marine scientist.    I discussed the limitations, risks, security and privacy concerns of performing an evaluation and management service by telephone and the availability of in person appointments. I also discussed with the patient that there may be a patient responsible charge related to this service. The patient expressed understanding and verbally consented to this telephonic visit.    Video connection was lost when less than 50% of the duration of the visit was complete, at which time the remainder of the visit was completed via audio only.  Some vital signs may be absent or patient reported.   Hearing/Vision screen  Hearing Screening   125Hz  250Hz  500Hz  1000Hz   2000Hz  3000Hz  4000Hz  6000Hz  8000Hz   Right ear:           Left ear:            Comments: Patient is able to hear conversational tones without difficulty.  No issues reported.  Vision Screening Comments: Wears corrective lenses  Visual acuity not assessed, virtual visit. They have seen their ophthalmologist in the last 12 months.   Dietary issues and exercise activities discussed: Current Exercise Habits: The patient does not participate in regular exercise at present  Goals    . Maintain Healthy Lifestyle     Stay active Stay hydrated Eat healthy      Depression Screen PHQ 2/9 Scores 05/06/2020 01/14/2020 10/14/2019 06/13/2019 05/06/2019 04/03/2018 02/19/2015  PHQ - 2 Score 0 0 0 0 0 0 0    Fall Risk Fall Risk  05/06/2020 01/14/2020 10/14/2019 05/06/2019 02/04/2019  Falls in the past year? 0 0 0 0 1  Number falls in past yr: 0 0 0 - 0  Injury with Fall? 0 0 0 - 0  Follow up Falls evaluation completed Falls evaluation completed Falls evaluation completed Education provided;Falls prevention discussed -   Handrails in use when climbing stairs? Yes Home free of loose throw rugs in walkways, pet beds, electrical cords, etc? Yes  Adequate lighting in your home to reduce risk of falls? Yes   ASSISTIVE DEVICES UTILIZED TO PREVENT FALLS: Use of a cane, walker or w/c? No   TIMED UP AND GO: Was the test performed? No .  Virtual visit.   Cognitive Function: Patient is alert and oriented x3.  Enjoys puzzles and sewing.      6CIT Screen 05/06/2020 05/06/2019  What Year? 0 points 0 points  What month? 0 points 0 points  What time? 0 points 0 points  Count back from 20 0 points 0 points  Months in reverse - 0 points  Repeat phrase - 2 points  Total Score - 2    Immunizations Immunization History  Administered Date(s) Administered  . Fluad Quad(high Dose 65+) 02/04/2019  . Influenza, High Dose Seasonal PF 02/19/2015, 04/03/2018  . Influenza-Unspecified 03/16/2014, 02/27/2017  . PFIZER SARS-COV-2 Vaccination 07/09/2019, 07/30/2019  . Pneumococcal Conjugate-13  07/04/2018  . Pneumococcal-Unspecified 03/16/2014  . Tdap 07/04/2018, 03/28/2019  . Zoster 04/16/2013  . Zoster Recombinat (Shingrix) 07/04/2018, 03/10/2019, 03/28/2019   Health Maintenance Health Maintenance  Topic Date Due  . DEXA SCAN  Never done  . INFLUENZA VACCINE  09/02/2020 (Originally 01/04/2020)  . Hepatitis C Screening  05/06/2021 (Originally 1941/04/30)  . HEMOGLOBIN A1C  08/04/2020  . FOOT EXAM  10/13/2020  . OPHTHALMOLOGY EXAM  03/31/2021  . TETANUS/TDAP  03/27/2029  . COVID-19 Vaccine  Completed  . PNA vac Low Risk Adult  Completed   Colorectal cancer screening: Completed 12/07/16. . Repeat every 5 years   Mammogram status: Completed 09/04/19. Repeat every year. 3D Bilateral.   Dexa Scan- Deferred. Plans to complete with next mammogram 09/2020.  Influenza vaccine- Deferred. Plans to receive next office visit.  Lung Cancer Screening: (Low Dose CT Chest recommended if Age 52-80 years, 30 pack-year currently smoking OR have quit w/in 15years.) does not qualify.   Hepatitis C Screening: does not qualify  Vision Screening: Recommended annual ophthalmology exams for early detection of glaucoma and other disorders of the eye. Is the patient up to date with their annual eye exam?  Yes  Who is the provider or what is the name of the office in which the patient  attends annual eye exams? [facility] Dental Screening: Recommended annual dental exams for proper oral hygiene.  Community Resource Referral / Chronic Care Management: CRR required this visit?  No   CCM required this visit?  No      Plan:   Keep all routine maintenance appointments.   Follow up 05/27/20 @ 8:00  I have personally reviewed and noted the following in the patient's chart:   . Medical and social history . Use of alcohol, tobacco or illicit drugs  . Current medications and supplements . Functional ability and status . Nutritional status . Physical activity . Advanced directives . List  of other physicians . Hospitalizations, surgeries, and ER visits in previous 12 months . Vitals . Screenings to include cognitive, depression, and falls . Referrals and appointments  In addition, I have reviewed and discussed with patient certain preventive protocols, quality metrics, and best practice recommendations. A written personalized care plan for preventive services as well as general preventive health recommendations were provided to patient via mail.     Varney Biles, LPN   16/11/628

## 2020-05-06 NOTE — Patient Instructions (Addendum)
Taylor Shaw , Thank you for taking time to come for your Medicare Wellness Visit. I appreciate your ongoing commitment to your health goals. Please review the following plan we discussed and let me know if I can assist you in the future.   These are the goals we discussed: Goals    . Maintain Healthy Lifestyle     Stay active Stay hydrated Eat healthy       This is a list of the screening recommended for you and due dates:  Health Maintenance  Topic Date Due  . DEXA scan (bone density measurement)  Never done  . Flu Shot  09/02/2020*  .  Hepatitis C: One time screening is recommended by Center for Disease Control  (CDC) for  adults born from [birth year] through 1964.   05/06/2021*  . Hemoglobin A1C  08/04/2020  . Complete foot exam   10/13/2020  . Eye exam for diabetics  03/31/2021  . Tetanus Vaccine  03/27/2029  . COVID-19 Vaccine  Completed  . Pneumonia vaccines  Completed  *Topic was postponed. The date shown is not the original due date.    Immunizations Immunization History  Administered Date(s) Administered  . Fluad Quad(high Dose 65+) 02/04/2019  . Influenza, High Dose Seasonal PF 02/19/2015, 04/03/2018  . Influenza-Unspecified 03/16/2014, 02/27/2017  . PFIZER SARS-COV-2 Vaccination 07/09/2019, 07/30/2019  . Pneumococcal Conjugate-13 07/04/2018  . Pneumococcal-Unspecified 03/16/2014  . Tdap 07/04/2018, 03/28/2019  . Zoster 04/16/2013  . Zoster Recombinat (Shingrix) 07/04/2018, 03/10/2019, 03/28/2019   Keep all routine maintenance appointments.   Follow up 05/27/20 @ 8:00  Advanced directives: End of life planning; Advance aging; Advanced directives discussed.  Copy of current HCPOA/Living Will requested.    Conditions/risks identified: none new  Follow up in one year for your annual wellness visit.   Preventive Care 44 Years and Older, Female Preventive care refers to lifestyle choices and visits with your health care provider that can promote health and  wellness. What does preventive care include?  A yearly physical exam. This is also called an annual well check.  Dental exams once or twice a year.  Routine eye exams. Ask your health care provider how often you should have your eyes checked.  Personal lifestyle choices, including:  Daily care of your teeth and gums.  Regular physical activity.  Eating a healthy diet.  Avoiding tobacco and drug use.  Limiting alcohol use.  Practicing safe sex.  Taking low-dose aspirin every day.  Taking vitamin and mineral supplements as recommended by your health care provider. What happens during an annual well check? The services and screenings done by your health care provider during your annual well check will depend on your age, overall health, lifestyle risk factors, and family history of disease. Counseling  Your health care provider may ask you questions about your:  Alcohol use.  Tobacco use.  Drug use.  Emotional well-being.  Home and relationship well-being.  Sexual activity.  Eating habits.  History of falls.  Memory and ability to understand (cognition).  Work and work Statistician.  Reproductive health. Screening  You may have the following tests or measurements:  Height, weight, and BMI.  Blood pressure.  Lipid and cholesterol levels. These may be checked every 5 years, or more frequently if you are over 42 years old.  Skin check.  Lung cancer screening. You may have this screening every year starting at age 74 if you have a 30-pack-year history of smoking and currently smoke or have quit within the past  15 years.  Fecal occult blood test (FOBT) of the stool. You may have this test every year starting at age 29.  Flexible sigmoidoscopy or colonoscopy. You may have a sigmoidoscopy every 5 years or a colonoscopy every 10 years starting at age 41.  Hepatitis C blood test.  Hepatitis B blood test.  Sexually transmitted disease (STD)  testing.  Diabetes screening. This is done by checking your blood sugar (glucose) after you have not eaten for a while (fasting). You may have this done every 1-3 years.  Bone density scan. This is done to screen for osteoporosis. You may have this done starting at age 3.  Mammogram. This may be done every 1-2 years. Talk to your health care provider about how often you should have regular mammograms. Talk with your health care provider about your test results, treatment options, and if necessary, the need for more tests. Vaccines  Your health care provider may recommend certain vaccines, such as:  Influenza vaccine. This is recommended every year.  Tetanus, diphtheria, and acellular pertussis (Tdap, Td) vaccine. You may need a Td booster every 10 years.  Zoster vaccine. You may need this after age 60.  Pneumococcal 13-valent conjugate (PCV13) vaccine. One dose is recommended after age 17.  Pneumococcal polysaccharide (PPSV23) vaccine. One dose is recommended after age 2. Talk to your health care provider about which screenings and vaccines you need and how often you need them. This information is not intended to replace advice given to you by your health care provider. Make sure you discuss any questions you have with your health care provider. Document Released: 06/18/2015 Document Revised: 02/09/2016 Document Reviewed: 03/23/2015 Elsevier Interactive Patient Education  2017 [place] Prevention in the Home Falls can cause injuries. They can happen to people of all ages. There are many things you can do to make your home safe and to help prevent falls. What can I do on the outside of my home?  Regularly fix the edges of walkways and driveways and fix any cracks.  Remove anything that might make you trip as you walk through a door, such as a raised step or threshold.  Trim any bushes or trees on the path to your home.  Use bright outdoor lighting.  Clear any walking  paths of anything that might make someone trip, such as rocks or tools.  Regularly check to see if handrails are loose or broken. Make sure that both sides of any steps have handrails.  Any raised decks and porches should have guardrails on the edges.  Have any leaves, snow, or ice cleared regularly.  Use sand or salt on walking paths during winter.  Clean up any spills in your garage right away. This includes oil or grease spills. What can I do in the bathroom?  Use night lights.  Install grab bars by the toilet and in the tub and shower. Do not use towel bars as grab bars.  Use non-skid mats or decals in the tub or shower.  If you need to sit down in the shower, use a plastic, non-slip stool.  Keep the floor dry. Clean up any water that spills on the floor as soon as it happens.  Remove soap buildup in the tub or shower regularly.  Attach bath mats securely with double-sided non-slip rug tape.  Do not have throw rugs and other things on the floor that can make you trip. What can I do in the bedroom?  Use night lights.  Make sure that you have a light by your bed that is easy to reach.  Do not use any sheets or blankets that are too big for your bed. They should not hang down onto the floor.  Have a firm chair that has side arms. You can use this for support while you get dressed.  Do not have throw rugs and other things on the floor that can make you trip. What can I do in the kitchen?  Clean up any spills right away.  Avoid walking on wet floors.  Keep items that you use a lot in easy-to-reach places.  If you need to reach something above you, use a strong step stool that has a grab bar.  Keep electrical cords out of the way.  Do not use floor polish or wax that makes floors slippery. If you must use wax, use non-skid floor wax.  Do not have throw rugs and other things on the floor that can make you trip. What can I do with my stairs?  Do not leave any items  on the stairs.  Make sure that there are handrails on both sides of the stairs and use them. Fix handrails that are broken or loose. Make sure that handrails are as long as the stairways.  Check any carpeting to make sure that it is firmly attached to the stairs. Fix any carpet that is loose or worn.  Avoid having throw rugs at the top or bottom of the stairs. If you do have throw rugs, attach them to the floor with carpet tape.  Make sure that you have a light switch at the top of the stairs and the bottom of the stairs. If you do not have them, ask someone to add them for you. What else can I do to help prevent falls?  Wear shoes that:  Do not have high heels.  Have rubber bottoms.  Are comfortable and fit you well.  Are closed at the toe. Do not wear sandals.  If you use a stepladder:  Make sure that it is fully opened. Do not climb a closed stepladder.  Make sure that both sides of the stepladder are locked into place.  Ask someone to hold it for you, if possible.  Clearly mark and make sure that you can see:  Any grab bars or handrails.  First and last steps.  Where the edge of each step is.  Use tools that help you move around (mobility aids) if they are needed. These include:  Canes.  Walkers.  Scooters.  Crutches.  Turn on the lights when you go into a dark area. Replace any light bulbs as soon as they burn out.  Set up your furniture so you have a clear path. Avoid moving your furniture around.  If any of your floors are uneven, fix them.  If there are any pets around you, be aware of where they are.  Review your medicines with your doctor. Some medicines can make you feel dizzy. This can increase your chance of falling. Ask your doctor what other things that you can do to help prevent falls. This information is not intended to replace advice given to you by your health care provider. Make sure you discuss any questions you have with your health care  provider. Document Released: 03/18/2009 Document Revised: 10/28/2015 Document Reviewed: 06/26/2014 Elsevier Interactive Patient Education  2017 [employer].

## 2020-05-17 DIAGNOSIS — G4733 Obstructive sleep apnea (adult) (pediatric): Secondary | ICD-10-CM | POA: Diagnosis not present

## 2020-05-17 DIAGNOSIS — H2512 Age-related nuclear cataract, left eye: Secondary | ICD-10-CM | POA: Diagnosis not present

## 2020-05-19 ENCOUNTER — Other Ambulatory Visit: Payer: Self-pay

## 2020-05-19 ENCOUNTER — Encounter: Payer: Self-pay | Admitting: Ophthalmology

## 2020-05-25 NOTE — Discharge Instructions (Signed)

## 2020-05-27 ENCOUNTER — Other Ambulatory Visit: Payer: Self-pay | Admitting: Internal Medicine

## 2020-05-27 ENCOUNTER — Encounter: Payer: Self-pay | Admitting: Internal Medicine

## 2020-05-27 ENCOUNTER — Other Ambulatory Visit: Payer: Self-pay

## 2020-05-27 ENCOUNTER — Ambulatory Visit (INDEPENDENT_AMBULATORY_CARE_PROVIDER_SITE_OTHER): Payer: Medicare HMO | Admitting: Internal Medicine

## 2020-05-27 ENCOUNTER — Other Ambulatory Visit
Admission: RE | Admit: 2020-05-27 | Discharge: 2020-05-27 | Disposition: A | Discharge status: Home / Self Care | Payer: Medicare HMO | Source: Ambulatory Visit | Attending: Ophthalmology | Admitting: Ophthalmology

## 2020-05-27 VITALS — BP 146/74 | HR 50 | Temp 97.8°F | Ht 64.0 in | Wt 261.4 lb

## 2020-05-27 DIAGNOSIS — I10 Essential (primary) hypertension: Secondary | ICD-10-CM | POA: Diagnosis not present

## 2020-05-27 DIAGNOSIS — E039 Hypothyroidism, unspecified: Secondary | ICD-10-CM

## 2020-05-27 DIAGNOSIS — Z23 Encounter for immunization: Secondary | ICD-10-CM | POA: Diagnosis not present

## 2020-05-27 DIAGNOSIS — Z01812 Encounter for preprocedural laboratory examination: Secondary | ICD-10-CM | POA: Insufficient documentation

## 2020-05-27 DIAGNOSIS — E1159 Type 2 diabetes mellitus with other circulatory complications: Secondary | ICD-10-CM | POA: Diagnosis not present

## 2020-05-27 DIAGNOSIS — Z1231 Encounter for screening mammogram for malignant neoplasm of breast: Secondary | ICD-10-CM | POA: Diagnosis not present

## 2020-05-27 DIAGNOSIS — Z20822 Contact with and (suspected) exposure to covid-19: Secondary | ICD-10-CM | POA: Diagnosis not present

## 2020-05-27 DIAGNOSIS — I152 Hypertension secondary to endocrine disorders: Secondary | ICD-10-CM

## 2020-05-27 DIAGNOSIS — H269 Unspecified cataract: Secondary | ICD-10-CM

## 2020-05-27 DIAGNOSIS — E2839 Other primary ovarian failure: Secondary | ICD-10-CM

## 2020-05-27 LAB — COMPREHENSIVE METABOLIC PANEL
ALT: 7 U/L (ref 0–35)
AST: 12 U/L (ref 0–37)
Albumin: 3.9 g/dL (ref 3.5–5.2)
Alkaline Phosphatase: 62 U/L (ref 39–117)
BUN: 11 mg/dL (ref 6–23)
CO2: 34 mEq/L — ABNORMAL HIGH (ref 19–32)
Calcium: 9.3 mg/dL (ref 8.4–10.5)
Chloride: 98 mEq/L (ref 96–112)
Creatinine, Ser: 0.8 mg/dL (ref 0.40–1.20)
GFR: 70.67 mL/min (ref >60.00)
Glucose, Bld: 108 mg/dL — ABNORMAL HIGH (ref 70–99)
Potassium: 3.8 mEq/L (ref 3.5–5.1)
Sodium: 138 mEq/L (ref 135–145)
Total Bilirubin: 0.6 mg/dL (ref 0.2–1.2)
Total Protein: 7.2 g/dL (ref 6.0–8.3)

## 2020-05-27 LAB — LIPID PANEL
Cholesterol: 139 mg/dL (ref 0–200)
HDL: 56.3 mg/dL (ref >39.00)
LDL Cholesterol: 64 mg/dL (ref 0–99)
NonHDL: 82.32
Total CHOL/HDL Ratio: 2
Triglycerides: 92 mg/dL (ref 0.0–149.0)
VLDL: 18.4 mg/dL (ref 0.0–40.0)

## 2020-05-27 LAB — TSH: TSH: 6.49 u[IU]/mL — ABNORMAL HIGH (ref 0.35–4.50)

## 2020-05-27 LAB — HEMOGLOBIN A1C: Hgb A1c MFr Bld: 6.4 % (ref 4.6–6.5)

## 2020-05-27 LAB — SARS CORONAVIRUS 2 (TAT 6-24 HRS): SARS Coronavirus 2: NEGATIVE

## 2020-05-27 MED ORDER — HYDRALAZINE HCL 10 MG PO TABS: 10.0000 mg | ORAL_TABLET | Freq: Two times a day (BID) | ORAL | 3 refills | Status: DC

## 2020-05-27 MED ORDER — LEVOTHYROXINE SODIUM 50 MCG PO TABS: 50.0000 ug | ORAL_TABLET | Freq: Every day | ORAL | 3 refills | Status: DC

## 2020-05-27 NOTE — Progress Notes (Signed)
Chief Complaint  Patient presents with  . Follow-up  . Hypertension   F/u  1. Flu shot given today 2. BP elevated today on norvasc 10, coreg 25 bid, hydralazine 10 bid prn advised to take bid and hyzaar 100-25 mg qd  3. Hypothyroidism on levo 25 mcg qd check labs today  4. B/l cataracts pending surgery left 05/31/20 and right 06/2020 Dr. Edison Pace  Review of Systems  Constitutional: Negative for weight loss.  HENT: Negative for hearing loss.   Eyes: Negative for blurred vision.  Respiratory: Negative for shortness of breath.   Cardiovascular: Negative for chest pain.  Gastrointestinal: Negative for abdominal pain.  Musculoskeletal: Positive for joint pain. Negative for falls.       6/10 right hip pain   Skin: Negative for rash.  Neurological: Negative for headaches.  Psychiatric/Behavioral: Negative for depression.   Past Medical History:  Diagnosis Date  . Arthritis   . Diabetes ([place]) 02/19/2015  . Diverticulosis 12/07/2016   Sigmoid Colon  . Diverticulosis of sigmoid colon 12/08/2016  . Elevated blood sugar 01/15/2015  . GERD (gastroesophageal reflux disease)   . Hemorrhoids   . History of blood transfusion   . Hyperlipidemia   . Hypertension 01/15/2015  . Obesity 01/15/2015  . OSA on CPAP    Past Surgical History:  Procedure Laterality Date  . BOTOX INJECTION N/A 03/27/2018   Procedure: BOTOX INJECTION;  Surgeon: Jules Husbands, MD;  Location: [facility];  Service: General;  Laterality: N/A;  . COLONOSCOPY    . COLONOSCOPY WITH PROPOFOL N/A 12/07/2016   Procedure: COLONOSCOPY WITH PROPOFOL;  Surgeon: [name], MD;  Location: [place];  Service: Endoscopy;  Laterality: N/A;  Diabetic - oral meds  . EVALUATION UNDER ANESTHESIA WITH ANAL FISSUROTOMY N/A 03/27/2018   Procedure: EXAM UNDER ANESTHESIA, CHEMICAL SPHINCTEROTOMY;  Surgeon: Jules Husbands, MD;  Location: [facility];  Service: General;  Laterality: N/A;  . HEMORRHOID SURGERY N/A 06/13/2018   Procedure:  [id number];  Surgeon: Jules Husbands, MD;  Location: [facility];  Service: General;  Laterality: N/A;  . JOINT REPLACEMENT     b/l hips in [place] 2004 and 2005 Dr. Alfonso Ramus   . POLYPECTOMY  12/07/2016   Procedure: POLYPECTOMY;  Surgeon: [name], MD;  Location: [place];  Service: Endoscopy;;  . RECTAL EXAM UNDER ANESTHESIA N/A 06/13/2018   Procedure: RECTAL EXAM UNDER ANESTHESIA;  Surgeon: Jules Husbands, MD;  Location: [facility];  Service: General;  Laterality: N/A;  . TOTAL HIP ARTHROPLASTY Bilateral   . TUBAL LIGATION     Family History  Problem Relation Age of Onset  . Dementia Mother   . Hypertension Mother   . Arthritis Mother   . Alcohol abuse Father   . Cirrhosis Father   . Cancer Daughter        breast  . COPD Brother   . Breast cancer Sister 50  . Cancer Sister        ? type   . Dementia Sister   . Cancer Brother        ?type  . Cancer Brother        cancer ?type  . Cancer Daughter        cancer ?type died 38   . Alzheimer's disease Sister    Social History   Socioeconomic History  . Marital status: Divorced    Spouse name: Not on file  . Number of children: Not on file  . Years of education:  Not on file  . Highest education level: Not on file  Occupational History  . Occupation: Astronomer covers    Comment: retired  Tobacco Use  . Smoking status: Never Smoker  . Smokeless tobacco: Never Used  Vaping Use  . Vaping Use: Never used  Substance and Sexual Activity  . Alcohol use: No    Alcohol/week: 0.0 standard drinks  . Drug use: No  . Sexual activity: Not Currently  Other Topics Concern  . Not on file  Social History Narrative   Lives with daughter [name] to go to church    Moved from [place] 3-4 years ago    No guns, wears seat belt    Never smoker    Social Determinants of Health   Financial Resource Strain: Low Risk   . Difficulty of Paying Living Expenses: Not hard at all  Food  Insecurity: No Food Insecurity  . Worried About Charity fundraiser in the Last Year: Never true  . Ran Out of Food in the Last Year: Never true  Transportation Needs: No Transportation Needs  . Lack of Transportation (Medical): No  . Lack of Transportation (Non-Medical): No  Physical Activity: Not on file  Stress: No Stress Concern Present  . Feeling of Stress : Not at all  Social Connections: Unknown  . Frequency of Communication with Friends and Family: More than three times a week  . Frequency of Social Gatherings with Friends and Family: Not on file  . Attends Religious Services: Not on file  . Active Member of Clubs or Organizations: Not on file  . Attends Archivist Meetings: Not on file  . Marital Status: Not on file  Intimate Partner Violence: Not At Risk  . Fear of Current or Ex-Partner: No  . Emotionally Abused: No  . Physically Abused: No  . Sexually Abused: No   Current Meds  Medication Sig  . amLODipine (NORVASC) 10 MG tablet Take 1 tablet (10 mg total) by mouth daily.  Marland Kitchen atorvastatin (LIPITOR) 10 MG tablet Take 1 tablet (10 mg total) by mouth daily at 6 PM.  . Blood Glucose Calibration (TRUE METRIX LEVEL 2) Normal SOLN   . blood glucose meter kit and supplies Dispense based on patient and insurance preference. Use up to four times daily as directed. (FOR ICD-10 E10.9, E11.9).  . Calcium Carb-Cholecalciferol 600-800 MG-UNIT TABS Take 1 tablet by mouth 2 (two) times daily.  . carvedilol (COREG) 25 MG tablet Take 1 tablet (25 mg total) by mouth 2 (two) times daily with a meal.  . docusate sodium (COLACE) 100 MG capsule Take 100 mg by mouth 2 (two) times daily as needed.  Marland Kitchen levothyroxine (SYNTHROID) 25 MCG tablet Take 1 tablet (25 mcg total) by mouth daily before breakfast. 30 minutes before food  . losartan-hydrochlorothiazide (HYZAAR) 100-25 MG tablet Take 1 tablet by mouth daily. In am  . metFORMIN (GLUCOPHAGE) 500 MG tablet 1 pill daily in the am with food   . Omega-3 Fatty Acids (FISH OIL) 1000 MG CAPS Take 1,000 mg by mouth 2 (two) times daily.   Marland Kitchen omeprazole (PRILOSEC) 20 MG capsule Take 1 capsule (20 mg total) by mouth daily. 30 min before food  . TRUE METRIX BLOOD GLUCOSE TEST test strip   . TRUEplus Lancets 33G MISC   . [DISCONTINUED] hydrALAZINE (APRESOLINE) 10 MG tablet Take 1 tablet (10 mg total) by mouth 2 (two) times daily as needed. Take if BP >  140/>80   No Known Allergies Recent Results (from the past 2160 hour(s))  HM DIABETES EYE EXAM     Status: None   Collection Time: 03/30/20 12:00 AM  Result Value Ref Range   HM Diabetic Eye Exam No Retinopathy No Retinopathy    Comment: AE   Objective  Body mass index is 44.87 kg/m. Wt Readings from Last 3 Encounters:  05/27/20 261 lb 6.4 oz (118.6 kg)  05/06/20 255 lb (115.7 kg)  01/14/20 255 lb (115.7 kg)   Temp Readings from Last 3 Encounters:  05/27/20 97.8 F (36.6 C) (Oral)  11/24/19 (!) 97.5 F (36.4 C) (Oral)  10/14/19 (!) 97 F (36.1 C) (Temporal)   BP Readings from Last 3 Encounters:  05/27/20 (!) 146/74  05/06/20 131/69  02/05/20 (!) 152/82   Pulse Readings from Last 3 Encounters:  05/27/20 (!) 50  05/06/20 63  02/05/20 69    Physical Exam Vitals and nursing note reviewed.  Constitutional:      Appearance: Normal appearance. She is well-developed and well-groomed. She is morbidly obese.  HENT:     Head: Normocephalic and atraumatic.  Eyes:     Conjunctiva/sclera: Conjunctivae normal.     Pupils: Pupils are equal, round, and reactive to light.  Cardiovascular:     Rate and Rhythm: Normal rate and regular rhythm.     Heart sounds: Normal heart sounds. No murmur heard.   Pulmonary:     Effort: Pulmonary effort is normal.     Breath sounds: Normal breath sounds.  Musculoskeletal:     Right lower leg: 1+ Pitting Edema present.     Left lower leg: 1+ Pitting Edema present.  Skin:    General: Skin is warm and dry.  Neurological:     General: No  focal deficit present.     Mental Status: She is alert and oriented to person, place, and time. Mental status is at baseline.     Gait: Gait normal.  Psychiatric:        Attention and Perception: Attention and perception normal.        Mood and Affect: Mood and affect normal.        Speech: Speech normal.        Behavior: Behavior normal. Behavior is cooperative.        Thought Content: Thought content normal.        Cognition and Memory: Cognition and memory normal.        Judgment: Judgment normal.     Assessment  Plan  Hypertension uncontrolled associated with diabetes ([place]) - Plan: Lipid panel, Comprehensive metabolic panel, Hemoglobin A1c norvasc 10 mg qd, coreg 25 mg bid, hydralazine 10 mg bid not prn, hyzaar 100-25 mg qd   Hypothyroidism, unspecified type - Plan: TSH Levo 25 mcg qd   Cataract of both eyes, unspecified cataract type  Left 12/27 and right 06/2020  HM Flu shotutd given today Tdap had 07/04/2018 pna 23 had 10/12/15will pna 23 06/2019 prevnarutd Had zostervax, shingrix had2/2 2/2 covid 19 rec booster  Mammogram 09/04/19 negative ordered  Out of age window pap  Colonoscopy had 12/07/16 IH, diverticulosis and tubular adenoma f/u in 5 years Dr. Allen Norris  DEXA get report prior [facility]CP Dr. Quillian [facility] pt had 5 years ago -ordered dexa  OSA f/u pulm, pfts sch 11/12/2018 and f/u pulm 11/19/2018, referred again   dentist appt sch 10/20/19   Provider: Dr. Olivia Mackie McLean-Scocuzza-[facility] Medicine

## 2020-05-27 NOTE — Patient Instructions (Addendum)
In 2 weeks get booster pfizer shot please  Goal blood pressure <130/<80

## 2020-05-31 ENCOUNTER — Ambulatory Visit
Admission: RE | Admit: 2020-05-31 | Discharge: 2020-05-31 | Disposition: A | Discharge status: Home / Self Care | Payer: Medicare HMO | Attending: Ophthalmology | Admitting: Ophthalmology

## 2020-05-31 ENCOUNTER — Other Ambulatory Visit: Payer: Self-pay

## 2020-05-31 ENCOUNTER — Encounter
Admission: RE | Disposition: A | Discharge status: Home / Self Care | Payer: Self-pay | Source: Home / Self Care | Attending: Ophthalmology

## 2020-05-31 ENCOUNTER — Encounter: Payer: Self-pay | Admitting: Ophthalmology

## 2020-05-31 ENCOUNTER — Ambulatory Visit: Payer: Medicare HMO | Admitting: Anesthesiology

## 2020-05-31 DIAGNOSIS — Z79899 Other long term (current) drug therapy: Secondary | ICD-10-CM | POA: Insufficient documentation

## 2020-05-31 DIAGNOSIS — Z7984 Long term (current) use of oral hypoglycemic drugs: Secondary | ICD-10-CM | POA: Insufficient documentation

## 2020-05-31 DIAGNOSIS — Z79890 Hormone replacement therapy (postmenopausal): Secondary | ICD-10-CM | POA: Insufficient documentation

## 2020-05-31 DIAGNOSIS — H25812 Combined forms of age-related cataract, left eye: Secondary | ICD-10-CM | POA: Diagnosis not present

## 2020-05-31 DIAGNOSIS — H2512 Age-related nuclear cataract, left eye: Secondary | ICD-10-CM | POA: Diagnosis not present

## 2020-05-31 DIAGNOSIS — R7989 Other specified abnormal findings of blood chemistry: Secondary | ICD-10-CM

## 2020-05-31 HISTORY — PX: CATARACT EXTRACTION W/PHACO: SHX586

## 2020-05-31 LAB — GLUCOSE, CAPILLARY
Glucose-Capillary: 118 mg/dL — ABNORMAL HIGH (ref 70–99)
Glucose-Capillary: 112 mg/dL — ABNORMAL HIGH (ref 70–99)

## 2020-05-31 SURGERY — PHACOEMULSIFICATION, CATARACT, WITH IOL INSERTION
Anesthesia: Monitor Anesthesia Care | Site: Eye | Laterality: Left

## 2020-05-31 MED ORDER — EPINEPHRINE PF 1 MG/ML IJ SOLN
INTRAOCULAR | Status: DC | PRN
  Administered 2020-05-31: 08:00:00 98 mL via OPHTHALMIC

## 2020-05-31 MED ORDER — MOXIFLOXACIN HCL 0.5 % OP SOLN
OPHTHALMIC | Status: DC | PRN
  Administered 2020-05-31: 0.2 mL via OPHTHALMIC

## 2020-05-31 MED ORDER — LIDOCAINE HCL (PF) 2 % IJ SOLN
INTRAOCULAR | Status: DC | PRN
  Administered 2020-05-31: 08:00:00 1 mL via INTRAOCULAR

## 2020-05-31 MED ORDER — MIDAZOLAM HCL 2 MG/2ML IJ SOLN
INTRAMUSCULAR | Status: DC | PRN
  Administered 2020-05-31: 1 mg via INTRAVENOUS

## 2020-05-31 MED ORDER — LACTATED RINGERS IV SOLN: INTRAVENOUS | Status: DC

## 2020-05-31 MED ORDER — FENTANYL CITRATE (PF) 100 MCG/2ML IJ SOLN
INTRAMUSCULAR | Status: DC | PRN
  Administered 2020-05-31: 50 ug via INTRAVENOUS

## 2020-05-31 MED ORDER — SODIUM HYALURONATE 23 MG/ML IO SOLN
INTRAOCULAR | Status: DC | PRN
  Administered 2020-05-31: 0.6 mL via INTRAOCULAR

## 2020-05-31 MED ORDER — SODIUM HYALURONATE 10 MG/ML IO SOLN
INTRAOCULAR | Status: DC | PRN
  Administered 2020-05-31: 0.55 mL via INTRAOCULAR

## 2020-05-31 MED ORDER — ARMC OPHTHALMIC DILATING DROPS
1.0000 "application " | OPHTHALMIC | Status: DC | PRN
  Administered 2020-05-31 (×3): 1 via OPHTHALMIC

## 2020-05-31 MED ORDER — TETRACAINE HCL 0.5 % OP SOLN
1.0000 [drp] | OPHTHALMIC | Status: DC | PRN
  Administered 2020-05-31 (×3): 1 [drp] via OPHTHALMIC

## 2020-05-31 SURGICAL SUPPLY — 19 items
CANNULA ANT/CHMB 27G (MISCELLANEOUS) ×2 IMPLANT
CANNULA ANT/CHMB 27GA (MISCELLANEOUS) ×6 IMPLANT
DISSECTOR HYDRO NUCLEUS 50X22 (MISCELLANEOUS) ×3 IMPLANT
GLOVE SURG LX 7.5 STRW (GLOVE) ×4
GLOVE SURG LX STRL 7.5 STRW (GLOVE) ×1 IMPLANT
GLOVE SURG SYN 8.5  E (GLOVE) ×2
GLOVE SURG SYN 8.5 E (GLOVE) ×1 IMPLANT
GLOVE SURG SYN 8.5 PF PI (GLOVE) ×1 IMPLANT
GOWN STRL REUS W/ TWL LRG LVL3 (GOWN DISPOSABLE) ×2 IMPLANT
GOWN STRL REUS W/TWL LRG LVL3 (GOWN DISPOSABLE) ×6
LENS IOL TECNIS EYHANCE 22.5 (Intraocular Lens) ×2 IMPLANT
MARKER SKIN DUAL TIP RULER LAB (MISCELLANEOUS) ×3 IMPLANT
PACK DR. KING ARMS (PACKS) ×3 IMPLANT
PACK EYE AFTER SURG (MISCELLANEOUS) ×3 IMPLANT
PACK OPTHALMIC (MISCELLANEOUS) ×3 IMPLANT
SYR 3ML LL SCALE MARK (SYRINGE) ×3 IMPLANT
SYR TB 1ML LUER SLIP (SYRINGE) ×3 IMPLANT
WATER STERILE IRR 250ML POUR (IV SOLUTION) ×3 IMPLANT
WIPE NON LINTING 3.25X3.25 (MISCELLANEOUS) ×3 IMPLANT

## 2020-05-31 NOTE — Anesthesia Procedure Notes (Signed)
Procedure Name: MAC Date/Time: 05/31/2020 7:35 AM Performed by: Cameron Ali, CRNA Pre-anesthesia Checklist: Patient identified, Emergency Drugs available, Suction available, Timeout performed and Patient being monitored Patient Re-evaluated:Patient Re-evaluated prior to induction Oxygen Delivery Method: Nasal cannula Placement Confirmation: positive ETCO2

## 2020-05-31 NOTE — Anesthesia Postprocedure Evaluation (Signed)
Anesthesia Post Note  Patient: Taylor Shaw  Procedure(s) Performed: CATARACT EXTRACTION PHACO AND INTRAOCULAR LENS PLACEMENT (IOC) LEFT (Left Eye)     Patient location during evaluation: PACU Anesthesia Type: MAC Level of consciousness: awake and alert Pain management: pain level controlled Vital Signs Assessment: post-procedure vital signs reviewed and stable Respiratory status: spontaneous breathing Cardiovascular status: blood pressure returned to baseline Postop Assessment: no apparent nausea or vomiting, adequate PO intake and no headache Anesthetic complications: no   No complications documented.  Adele Barthel Mirta Mally

## 2020-05-31 NOTE — Anesthesia Preprocedure Evaluation (Signed)
Anesthesia Evaluation  Patient identified by MRN, date of birth, ID band Patient awake    History of Anesthesia Complications Negative for: history of anesthetic complications  Airway Mallampati: II  TM Distance: >3 FB Neck ROM: Full    Dental no notable dental hx.    Pulmonary sleep apnea and Continuous Positive Airway Pressure Ventilation ,    Pulmonary exam normal        Cardiovascular Exercise Tolerance: Good hypertension, Pt. on medications and Pt. on home beta blockers Normal cardiovascular exam     Neuro/Psych    GI/Hepatic GERD  Medicated,  Endo/Other  diabetes, Type 2Hypothyroidism   Renal/GU      Musculoskeletal   Abdominal   Peds  Hematology   Anesthesia Other Findings   Reproductive/Obstetrics                             Anesthesia Physical Anesthesia Plan  ASA: II  Anesthesia Plan: MAC   Post-op Pain Management:    Induction: Intravenous  PONV Risk Score and Plan: 2 and Midazolam and Treatment may vary due to age or medical condition  Airway Management Planned: Natural Airway and Nasal Cannula  Additional Equipment: None  Intra-op Plan:   Post-operative Plan:   Informed Consent: I have reviewed the patients History and Physical, chart, labs and discussed the procedure including the risks, benefits and alternatives for the proposed anesthesia with the patient or authorized representative who has indicated his/her understanding and acceptance.       Plan Discussed with: CRNA  Anesthesia Plan Comments:         Anesthesia Quick Evaluation

## 2020-05-31 NOTE — Op Note (Signed)
OPERATIVE NOTE  Taylor Shaw [id number] 05/31/2020   PREOPERATIVE DIAGNOSIS:  Nuclear sclerotic cataract left eye.  H25.12   POSTOPERATIVE DIAGNOSIS:    Nuclear sclerotic cataract left eye.     PROCEDURE:  Phacoemusification with posterior chamber intraocular lens placement of the left eye   LENS:   Implant Name Type Inv. Item Serial No. Manufacturer Lot No. LRB No. Used Action  LENS IOL TECNIS EYHANCE 22.5 - [id number] Intraocular Lens LENS IOL TECNIS EYHANCE 22.5 [id number] JOHNSON   Left 1 Implanted      Procedure(s) with comments: CATARACT EXTRACTION PHACO AND INTRAOCULAR LENS PLACEMENT (IOC) LEFT (Left) - 4.16 0:40.4  DIB00 +22.5   ULTRASOUND TIME: 0 minutes 40 seconds.  CDE 4.16   SURGEON:  Willey Blade, MD, MPH   ANESTHESIA:  Topical with tetracaine drops augmented with 1% preservative-free intracameral lidocaine.  ESTIMATED BLOOD LOSS: <1 mL   COMPLICATIONS:  None.   DESCRIPTION OF PROCEDURE:  The patient was identified in the holding room and transported to the operating room and placed in the supine position under the operating microscope.  The left eye was identified as the operative eye and it was prepped and draped in the usual sterile ophthalmic fashion.   A 1.0 millimeter clear-corneal paracentesis was made at the 5:00 position. 0.5 ml of preservative-free 1% lidocaine with epinephrine was injected into the anterior chamber.  The anterior chamber was filled with Healon 5 viscoelastic.  A 2.4 millimeter keratome was used to make a near-clear corneal incision at the 2:00 position.  A curvilinear capsulorrhexis was made with a cystotome and capsulorrhexis forceps.  Balanced salt solution was used to hydrodissect and hydrodelineate the nucleus.   Phacoemulsification was then used in stop and chop fashion to remove the lens nucleus and epinucleus.  The remaining cortex was then removed using the irrigation and aspiration handpiece. Healon was then placed into the  capsular bag to distend it for lens placement.  A lens was then injected into the capsular bag.  The remaining viscoelastic was aspirated.   Wounds were hydrated with balanced salt solution.  The anterior chamber was inflated to a physiologic pressure with balanced salt solution.  Intracameral vigamox 0.1 mL undiltued was injected into the eye and a drop placed onto the ocular surface.  No wound leaks were noted.  The patient was taken to the recovery room in stable condition without complications of anesthesia or surgery  Willey Blade 05/31/2020, 7:59 AM

## 2020-05-31 NOTE — H&P (Signed)
[facility]   Primary Care Physician:  McLean-Scocuzza, Nino Glow, MD Ophthalmologist: Dr. Benay Pillow  Pre-Procedure History & Physical: HPI:  Taylor Shaw is a 79 y.o. female here for cataract surgery.   Past Medical History:  Diagnosis Date  . Arthritis   . Diabetes ([place]) 02/19/2015  . Diverticulosis 12/07/2016   Sigmoid Colon  . Diverticulosis of sigmoid colon 12/08/2016  . Elevated blood sugar 01/15/2015  . GERD (gastroesophageal reflux disease)   . Hemorrhoids   . History of blood transfusion   . Hyperlipidemia   . Hypertension 01/15/2015  . Obesity 01/15/2015  . OSA on CPAP     Past Surgical History:  Procedure Laterality Date  . BOTOX INJECTION N/A 03/27/2018   Procedure: BOTOX INJECTION;  Surgeon: Jules Husbands, MD;  Location: [facility];  Service: General;  Laterality: N/A;  . COLONOSCOPY    . COLONOSCOPY WITH PROPOFOL N/A 12/07/2016   Procedure: COLONOSCOPY WITH PROPOFOL;  Surgeon: [name], MD;  Location: [place];  Service: Endoscopy;  Laterality: N/A;  Diabetic - oral meds  . EVALUATION UNDER ANESTHESIA WITH ANAL FISSUROTOMY N/A 03/27/2018   Procedure: EXAM UNDER ANESTHESIA, CHEMICAL SPHINCTEROTOMY;  Surgeon: Jules Husbands, MD;  Location: [facility];  Service: General;  Laterality: N/A;  . HEMORRHOID SURGERY N/A 06/13/2018   Procedure: [id number];  Surgeon: Jules Husbands, MD;  Location: [facility];  Service: General;  Laterality: N/A;  . JOINT REPLACEMENT     b/l hips in [place] 2004 and 2005 Dr. Alfonso Ramus   . POLYPECTOMY  12/07/2016   Procedure: POLYPECTOMY;  Surgeon: [name], MD;  Location: [place];  Service: Endoscopy;;  . RECTAL EXAM UNDER ANESTHESIA N/A 06/13/2018   Procedure: RECTAL EXAM UNDER ANESTHESIA;  Surgeon: Jules Husbands, MD;  Location: [facility];  Service: General;  Laterality: N/A;  . TOTAL HIP ARTHROPLASTY Bilateral   . TUBAL LIGATION      Prior to Admission medications   Medication Sig Start Date End Date  Taking? Authorizing Provider  amLODipine (NORVASC) 10 MG tablet Take 1 tablet (10 mg total) by mouth daily. 02/25/20  Yes McLean-Scocuzza, Nino Glow, MD  atorvastatin (LIPITOR) 10 MG tablet Take 1 tablet (10 mg total) by mouth daily at 6 PM. 02/25/20  Yes McLean-Scocuzza, Nino Glow, MD  Calcium Carb-Cholecalciferol 600-800 MG-UNIT TABS Take 1 tablet by mouth 2 (two) times daily.   Yes [provider]  carvedilol (COREG) 25 MG tablet Take 1 tablet (25 mg total) by mouth 2 (two) times daily with a meal. 02/26/20  Yes McLean-Scocuzza, Nino Glow, MD  docusate sodium (COLACE) 100 MG capsule Take 100 mg by mouth 2 (two) times daily as needed.   Yes [provider]  hydrALAZINE (APRESOLINE) 10 MG tablet Take 1 tablet (10 mg total) by mouth in the morning and at bedtime. 05/27/20  Yes McLean-Scocuzza, Nino Glow, MD  levothyroxine (SYNTHROID) 50 MCG tablet Take 1 tablet (50 mcg total) by mouth daily before breakfast. 30 minutes before food 05/27/20  Yes McLean-Scocuzza, Nino Glow, MD  losartan-hydrochlorothiazide (HYZAAR) 100-25 MG tablet Take 1 tablet by mouth daily. In am 02/26/20  Yes McLean-Scocuzza, Nino Glow, MD  metFORMIN (GLUCOPHAGE) 500 MG tablet 1 pill daily in the am with food 02/25/20  Yes McLean-Scocuzza, Nino Glow, MD  Omega-3 Fatty Acids (FISH OIL) 1000 MG CAPS Take 1,000 mg by mouth 2 (two) times daily.    Yes [provider]  omeprazole (PRILOSEC) 20 MG capsule Take 1  capsule (20 mg total) by mouth daily. 30 min before food 02/25/20  Yes McLean-Scocuzza, Nino Glow, MD  Blood Glucose Calibration (TRUE METRIX LEVEL 2) Normal SOLN  02/27/20   [provider]  blood glucose meter kit and supplies Dispense based on patient and insurance preference. Use up to four times daily as directed. (FOR ICD-10 E10.9, E11.9). 02/25/20   McLean-Scocuzza, Nino Glow, MD  TRUE METRIX BLOOD GLUCOSE TEST test strip  02/27/20   [provider]  TRUEplus Lancets 33G [place]  02/27/20   [provider]    Allergies as of 04/13/2020  . (No Known Allergies)    Family History  Problem Relation Age of Onset  . Dementia Mother   . Hypertension Mother   . Arthritis Mother   . Alcohol abuse Father   . Cirrhosis Father   . Cancer Daughter        breast  . COPD Brother   . Breast cancer Sister 58  . Cancer Sister        ? type   . Dementia Sister   . Cancer Brother        ?type  . Cancer Brother        cancer ?type  . Cancer Daughter        cancer ?type died 77   . Alzheimer's disease Sister     Social History   Socioeconomic History  . Marital status: Divorced    Spouse name: Not on file  . Number of children: Not on file  . Years of education: Not on file  . Highest education level: Not on file  Occupational History  . Occupation: Astronomer covers    Comment: retired  Tobacco Use  . Smoking status: Never Smoker  . Smokeless tobacco: Never Used  Vaping Use  . Vaping Use: Never used  Substance and Sexual Activity  . Alcohol use: No    Alcohol/week: 0.0 standard drinks  . Drug use: No  . Sexual activity: Not Currently  Other Topics Concern  . Not on file  Social History Narrative   Lives with daughter [name] to go to church    Moved from [place] 3-4 years ago    No guns, wears seat belt    Never smoker    Social Determinants of Health   Financial Resource Strain: Low Risk   . Difficulty of Paying Living Expenses: Not hard at all  Food Insecurity: No Food Insecurity  . Worried About Charity fundraiser in the Last Year: Never true  . Ran Out of Food in the Last Year: Never true  Transportation Needs: No Transportation Needs  . Lack of Transportation (Medical): No  . Lack of Transportation (Non-Medical): No  Physical Activity: Not on file  Stress: No Stress Concern Present  . Feeling of Stress : Not at all  Social Connections: Unknown  . Frequency of Communication with Friends and Family: More  than three times a week  . Frequency of Social Gatherings with Friends and Family: Not on file  . Attends Religious Services: Not on file  . Active Member of Clubs or Organizations: Not on file  . Attends Archivist Meetings: Not on file  . Marital Status: Not on file  Intimate Partner Violence: Not At Risk  . Fear of Current or Ex-Partner: No  . Emotionally Abused: No  . Physically Abused: No  . Sexually Abused: No    Review  of Systems: See HPI, otherwise negative ROS  Physical Exam: BP (!) 181/72   Pulse (!) 51   Temp (!) 97 F (36.1 C) (Temporal)   Resp 18   Ht _0  (1.626 m)   Wt 117 kg   SpO2 99%   BMI 44.29 kg/m  General:   Alert,  pleasant and cooperative in NAD Head:  Normocephalic and atraumatic. Respiratory:  Normal work of breathing.  Impression/Plan: Taylor Shaw is here for cataract surgery.  Risks, benefits, limitations, and alternatives regarding cataract surgery have been reviewed with the patient.  Questions have been answered.  All parties agreeable.   Benay Pillow, MD  05/31/2020, 7:20 AM

## 2020-05-31 NOTE — Transfer of Care (Signed)
Immediate Anesthesia Transfer of Care Note  Patient: Taylor Shaw  Procedure(s) Performed: CATARACT EXTRACTION PHACO AND INTRAOCULAR LENS PLACEMENT (IOC) LEFT (Left Eye)  Patient Location: PACU  Anesthesia Type: MAC  Level of Consciousness: awake, alert  and patient cooperative  Airway and Oxygen Therapy: Patient Spontanous Breathing and Patient connected to supplemental oxygen  Post-op Assessment: Post-op Vital signs reviewed, Patient's Cardiovascular Status Stable, Respiratory Function Stable, Patent Airway and No signs of Nausea or vomiting  Post-op Vital Signs: Reviewed and stable  Complications: No complications documented.

## 2020-06-11 DIAGNOSIS — H2511 Age-related nuclear cataract, right eye: Secondary | ICD-10-CM | POA: Diagnosis not present

## 2020-06-11 DIAGNOSIS — G4733 Obstructive sleep apnea (adult) (pediatric): Secondary | ICD-10-CM | POA: Diagnosis not present

## 2020-06-14 ENCOUNTER — Other Ambulatory Visit: Payer: Self-pay

## 2020-06-14 ENCOUNTER — Encounter: Payer: Self-pay | Admitting: Anesthesiology

## 2020-06-14 ENCOUNTER — Encounter: Payer: Self-pay | Admitting: Ophthalmology

## 2020-06-14 NOTE — Anesthesia Preprocedure Evaluation (Deleted)
Anesthesia Evaluation    Airway        Dental   Pulmonary           Cardiovascular hypertension,      Neuro/Psych    GI/Hepatic   Endo/Other  diabetes  Renal/GU      Musculoskeletal   Abdominal   Peds  Hematology   Anesthesia Other Findings   Reproductive/Obstetrics                             Anesthesia Physical Anesthesia Plan  ASA: III  Anesthesia Plan:    Post-op Pain Management:    Induction:   PONV Risk Score and Plan:   Airway Management Planned:   Additional Equipment:   Intra-op Plan:   Post-operative Plan:   Informed Consent:   Plan Discussed with:   Anesthesia Plan Comments:         Anesthesia Quick Evaluation

## 2020-06-17 ENCOUNTER — Other Ambulatory Visit: Payer: Self-pay

## 2020-06-17 ENCOUNTER — Other Ambulatory Visit
Admission: RE | Admit: 2020-06-17 | Discharge: 2020-06-17 | Disposition: A | Discharge status: Home / Self Care | Payer: Medicare HMO | Source: Ambulatory Visit | Attending: Ophthalmology | Admitting: Ophthalmology

## 2020-06-17 DIAGNOSIS — Z20822 Contact with and (suspected) exposure to covid-19: Secondary | ICD-10-CM | POA: Insufficient documentation

## 2020-06-17 DIAGNOSIS — Z01812 Encounter for preprocedural laboratory examination: Secondary | ICD-10-CM | POA: Diagnosis not present

## 2020-06-17 LAB — SARS CORONAVIRUS 2 (TAT 6-24 HRS): SARS Coronavirus 2: NEGATIVE

## 2020-06-17 NOTE — Discharge Instructions (Signed)

## 2020-06-28 ENCOUNTER — Encounter: Payer: Self-pay | Admitting: Ophthalmology

## 2020-06-28 ENCOUNTER — Other Ambulatory Visit: Payer: Self-pay

## 2020-07-01 ENCOUNTER — Other Ambulatory Visit: Payer: Self-pay

## 2020-07-01 ENCOUNTER — Other Ambulatory Visit
Admission: RE | Admit: 2020-07-01 | Discharge: 2020-07-01 | Disposition: A | Discharge status: Home / Self Care | Payer: Medicare HMO | Source: Ambulatory Visit | Attending: Ophthalmology | Admitting: Ophthalmology

## 2020-07-01 DIAGNOSIS — Z20822 Contact with and (suspected) exposure to covid-19: Secondary | ICD-10-CM | POA: Insufficient documentation

## 2020-07-01 DIAGNOSIS — Z01812 Encounter for preprocedural laboratory examination: Secondary | ICD-10-CM | POA: Diagnosis not present

## 2020-07-01 LAB — SARS CORONAVIRUS 2 (TAT 6-24 HRS): SARS Coronavirus 2: NEGATIVE

## 2020-07-03 DIAGNOSIS — G4733 Obstructive sleep apnea (adult) (pediatric): Secondary | ICD-10-CM | POA: Diagnosis not present

## 2020-07-05 ENCOUNTER — Encounter: Payer: Self-pay | Admitting: Ophthalmology

## 2020-07-05 ENCOUNTER — Other Ambulatory Visit: Payer: Self-pay

## 2020-07-05 ENCOUNTER — Encounter: Payer: Self-pay | Admitting: Anesthesiology

## 2020-07-05 ENCOUNTER — Encounter
Admission: RE | Disposition: A | Discharge status: Home / Self Care | Payer: Self-pay | Source: Home / Self Care | Attending: Ophthalmology

## 2020-07-05 ENCOUNTER — Ambulatory Visit
Admission: RE | Admit: 2020-07-05 | Discharge: 2020-07-05 | Disposition: A | Discharge status: Home / Self Care | Payer: Medicare HMO | Attending: Ophthalmology | Admitting: Ophthalmology

## 2020-07-05 DIAGNOSIS — H2511 Age-related nuclear cataract, right eye: Secondary | ICD-10-CM | POA: Diagnosis not present

## 2020-07-05 DIAGNOSIS — Z79899 Other long term (current) drug therapy: Secondary | ICD-10-CM | POA: Insufficient documentation

## 2020-07-05 DIAGNOSIS — Z79890 Hormone replacement therapy (postmenopausal): Secondary | ICD-10-CM | POA: Insufficient documentation

## 2020-07-05 DIAGNOSIS — Z7984 Long term (current) use of oral hypoglycemic drugs: Secondary | ICD-10-CM | POA: Insufficient documentation

## 2020-07-05 DIAGNOSIS — E1136 Type 2 diabetes mellitus with diabetic cataract: Secondary | ICD-10-CM | POA: Insufficient documentation

## 2020-07-05 DIAGNOSIS — H25811 Combined forms of age-related cataract, right eye: Secondary | ICD-10-CM | POA: Diagnosis not present

## 2020-07-05 HISTORY — PX: CATARACT EXTRACTION W/PHACO: SHX586

## 2020-07-05 LAB — GLUCOSE, CAPILLARY
Glucose-Capillary: 104 mg/dL — ABNORMAL HIGH (ref 70–99)
Glucose-Capillary: 103 mg/dL — ABNORMAL HIGH (ref 70–99)

## 2020-07-05 SURGERY — PHACOEMULSIFICATION, CATARACT, WITH IOL INSERTION
Anesthesia: Monitor Anesthesia Care | Site: Eye | Laterality: Right

## 2020-07-05 MED ORDER — LIDOCAINE HCL (PF) 2 % IJ SOLN
INTRAOCULAR | Status: DC | PRN
  Administered 2020-07-05: 4 mL via INTRAOCULAR

## 2020-07-05 MED ORDER — TETRACAINE HCL 0.5 % OP SOLN
1.0000 [drp] | OPHTHALMIC | Status: DC | PRN
  Administered 2020-07-05 (×3): 1 [drp] via OPHTHALMIC

## 2020-07-05 MED ORDER — SODIUM HYALURONATE 23 MG/ML IO SOLN
INTRAOCULAR | Status: DC | PRN
  Administered 2020-07-05: 0.6 mL via INTRAOCULAR

## 2020-07-05 MED ORDER — MIDAZOLAM HCL 2 MG/2ML IJ SOLN
INTRAMUSCULAR | Status: DC | PRN
  Administered 2020-07-05: 2 mg via INTRAVENOUS

## 2020-07-05 MED ORDER — LACTATED RINGERS IV SOLN: INTRAVENOUS | Status: DC

## 2020-07-05 MED ORDER — ACETAMINOPHEN 160 MG/5ML PO SOLN: 325.0000 mg | Freq: Once | ORAL | Status: DC

## 2020-07-05 MED ORDER — SODIUM HYALURONATE 10 MG/ML IO SOLN
INTRAOCULAR | Status: DC | PRN
  Administered 2020-07-05: 0.55 mL via INTRAOCULAR

## 2020-07-05 MED ORDER — EPINEPHRINE PF 1 MG/ML IJ SOLN
INTRAOCULAR | Status: DC | PRN
  Administered 2020-07-05: 70 mL via OPHTHALMIC

## 2020-07-05 MED ORDER — ARMC OPHTHALMIC DILATING DROPS
1.0000 "application " | OPHTHALMIC | Status: DC | PRN
  Administered 2020-07-05 (×3): 1 via OPHTHALMIC

## 2020-07-05 MED ORDER — ACETAMINOPHEN 325 MG PO TABS: 325.0000 mg | ORAL_TABLET | Freq: Once | ORAL | Status: DC

## 2020-07-05 MED ORDER — MOXIFLOXACIN HCL 0.5 % OP SOLN
OPHTHALMIC | Status: DC | PRN
  Administered 2020-07-05: 0.2 mL via OPHTHALMIC

## 2020-07-05 MED ORDER — FENTANYL CITRATE (PF) 100 MCG/2ML IJ SOLN
INTRAMUSCULAR | Status: DC | PRN
  Administered 2020-07-05: 50 ug via INTRAVENOUS

## 2020-07-05 SURGICAL SUPPLY — 19 items
CANNULA ANT/CHMB 27G (MISCELLANEOUS) ×2 IMPLANT
CANNULA ANT/CHMB 27GA (MISCELLANEOUS) ×4 IMPLANT
DISSECTOR HYDRO NUCLEUS 50X22 (MISCELLANEOUS) ×2 IMPLANT
GLOVE SURG LX 7.5 STRW (GLOVE) ×1
GLOVE SURG LX STRL 7.5 STRW (GLOVE) ×1 IMPLANT
GLOVE SURG SYN 8.5  E (GLOVE) ×1
GLOVE SURG SYN 8.5 E (GLOVE) ×1 IMPLANT
GLOVE SURG SYN 8.5 PF PI (GLOVE) ×1 IMPLANT
GOWN STRL REUS W/ TWL LRG LVL3 (GOWN DISPOSABLE) ×2 IMPLANT
GOWN STRL REUS W/TWL LRG LVL3 (GOWN DISPOSABLE) ×4
LENS IOL TECNIS EYHANCE 22.0 (Intraocular Lens) ×1 IMPLANT
MARKER SKIN DUAL TIP RULER LAB (MISCELLANEOUS) ×2 IMPLANT
PACK DR. KING ARMS (PACKS) ×2 IMPLANT
PACK EYE AFTER SURG (MISCELLANEOUS) ×2 IMPLANT
PACK OPTHALMIC (MISCELLANEOUS) ×2 IMPLANT
SYR 3ML LL SCALE MARK (SYRINGE) ×2 IMPLANT
SYR TB 1ML LUER SLIP (SYRINGE) ×2 IMPLANT
WATER STERILE IRR 250ML POUR (IV SOLUTION) ×2 IMPLANT
WIPE NON LINTING 3.25X3.25 (MISCELLANEOUS) ×2 IMPLANT

## 2020-07-05 NOTE — Op Note (Signed)
OPERATIVE NOTE  Taylor Shaw [id number] 07/05/2020   PREOPERATIVE DIAGNOSIS:  Nuclear sclerotic cataract right eye.  H25.11   POSTOPERATIVE DIAGNOSIS:    Nuclear sclerotic cataract right eye.     PROCEDURE:  Phacoemusification with posterior chamber intraocular lens placement of the right eye   LENS:   Implant Name Type Inv. Item Serial No. Manufacturer Lot No. LRB No. Used Action  LENS IOL TECNIS EYHANCE 22.0 - [id number] Intraocular Lens LENS IOL TECNIS EYHANCE 22.0 [id number] JOHNSON   Right 1 Implanted       Procedure(s) with comments: CATARACT EXTRACTION PHACO AND INTRAOCULAR LENS PLACEMENT (IOC) RIGHT DIABETIC 3.20 00:26.3 (Right) - Diabetic - oral meds  DIB00 +22.0   ULTRASOUND TIME: 0 minutes 26 seconds.  CDE 3.20   SURGEON:  Benay Pillow, MD, MPH  ANESTHESIOLOGIST: Anesthesiologist: Ronelle Nigh, MD CRNA: Silvana Newness, CRNA   ANESTHESIA:  Topical with tetracaine drops augmented with 1% preservative-free intracameral lidocaine.  ESTIMATED BLOOD LOSS: less than 1 mL.   COMPLICATIONS:  None.   DESCRIPTION OF PROCEDURE:  The patient was identified in the holding room and transported to the operating room and placed in the supine position under the operating microscope.  The right eye was identified as the operative eye and it was prepped and draped in the usual sterile ophthalmic fashion.   A 1.0 millimeter clear-corneal paracentesis was made at the 10:30 position. 0.5 ml of preservative-free 1% lidocaine with epinephrine was injected into the anterior chamber.  The anterior chamber was filled with Healon 5 viscoelastic.  A 2.4 millimeter keratome was used to make a near-clear corneal incision at the 8:00 position.  A curvilinear capsulorrhexis was made with a cystotome and capsulorrhexis forceps.  Balanced salt solution was used to hydrodissect and hydrodelineate the nucleus.   Phacoemulsification was then used in stop and chop fashion to remove the lens nucleus  and epinucleus.  The remaining cortex was then removed using the irrigation and aspiration handpiece. Healon was then placed into the capsular bag to distend it for lens placement.  A lens was then injected into the capsular bag.  The remaining viscoelastic was aspirated.   Wounds were hydrated with balanced salt solution.  The anterior chamber was inflated to a physiologic pressure with balanced salt solution.   Intracameral vigamox 0.1 mL undiluted was injected into the eye and a drop placed onto the ocular surface.  No wound leaks were noted.  The patient was taken to the recovery room in stable condition without complications of anesthesia or surgery  Benay Pillow 07/05/2020, 7:51 AM

## 2020-07-05 NOTE — H&P (Signed)
[facility]   Primary Care Physician:  McLean-Scocuzza, Nino Glow, MD Ophthalmologist: Dr. Benay Pillow  Pre-Procedure History & Physical: HPI:  Taylor Shaw is a 80 y.o. female here for cataract surgery.   Past Medical History:  Diagnosis Date  . Arthritis   . Diabetes ([place]) 02/19/2015  . Diverticulosis 12/07/2016   Sigmoid Colon  . Diverticulosis of sigmoid colon 12/08/2016  . Elevated blood sugar 01/15/2015  . GERD (gastroesophageal reflux disease)   . Hemorrhoids   . History of blood transfusion   . Hyperlipidemia   . Hypertension 01/15/2015  . Obesity 01/15/2015  . OSA on CPAP     Past Surgical History:  Procedure Laterality Date  . BOTOX INJECTION N/A 03/27/2018   Procedure: BOTOX INJECTION;  Surgeon: Jules Husbands, MD;  Location: [facility];  Service: General;  Laterality: N/A;  . CATARACT EXTRACTION W/PHACO Left 05/31/2020   Procedure: CATARACT EXTRACTION PHACO AND INTRAOCULAR LENS PLACEMENT ([place]) LEFT;  Surgeon: Eulogio Bear, MD;  Location: [place];  Service: Ophthalmology;  Laterality: Left;  4.16 0:40.4  . COLONOSCOPY    . COLONOSCOPY WITH PROPOFOL N/A 12/07/2016   Procedure: COLONOSCOPY WITH PROPOFOL;  Surgeon: [name], MD;  Location: [place];  Service: Endoscopy;  Laterality: N/A;  Diabetic - oral meds  . EVALUATION UNDER ANESTHESIA WITH ANAL FISSUROTOMY N/A 03/27/2018   Procedure: EXAM UNDER ANESTHESIA, CHEMICAL SPHINCTEROTOMY;  Surgeon: Jules Husbands, MD;  Location: [facility];  Service: General;  Laterality: N/A;  . HEMORRHOID SURGERY N/A 06/13/2018   Procedure: [id number];  Surgeon: Jules Husbands, MD;  Location: [facility];  Service: General;  Laterality: N/A;  . JOINT REPLACEMENT     b/l hips in [place] 2004 and 2005 Dr. Alfonso Ramus   . POLYPECTOMY  12/07/2016   Procedure: POLYPECTOMY;  Surgeon: [name], MD;  Location: [place];  Service: Endoscopy;;  . RECTAL EXAM UNDER ANESTHESIA N/A 06/13/2018    Procedure: RECTAL EXAM UNDER ANESTHESIA;  Surgeon: Jules Husbands, MD;  Location: [facility];  Service: General;  Laterality: N/A;  . TOTAL HIP ARTHROPLASTY Bilateral   . TUBAL LIGATION      Prior to Admission medications   Medication Sig Start Date End Date Taking? Authorizing Provider  amLODipine (NORVASC) 10 MG tablet Take 1 tablet (10 mg total) by mouth daily. 02/25/20  Yes McLean-Scocuzza, Nino Glow, MD  atorvastatin (LIPITOR) 10 MG tablet Take 1 tablet (10 mg total) by mouth daily at 6 PM. 02/25/20  Yes McLean-Scocuzza, Nino Glow, MD  Calcium Carb-Cholecalciferol 600-800 MG-UNIT TABS Take 1 tablet by mouth 2 (two) times daily.   Yes [provider]  carvedilol (COREG) 25 MG tablet Take 1 tablet (25 mg total) by mouth 2 (two) times daily with a meal. 02/26/20  Yes McLean-Scocuzza, Nino Glow, MD  docusate sodium (COLACE) 100 MG capsule Take 100 mg by mouth 2 (two) times daily as needed.   Yes [provider]  hydrALAZINE (APRESOLINE) 10 MG tablet Take 1 tablet (10 mg total) by mouth in the morning and at bedtime. 05/27/20  Yes McLean-Scocuzza, Nino Glow, MD  levothyroxine (SYNTHROID) 50 MCG tablet Take 1 tablet (50 mcg total) by mouth daily before breakfast. 30 minutes before food 05/27/20  Yes McLean-Scocuzza, Nino Glow, MD  losartan-hydrochlorothiazide (HYZAAR) 100-25 MG tablet Take 1 tablet by mouth daily. In am 02/26/20  Yes McLean-Scocuzza, Nino Glow, MD  metFORMIN (GLUCOPHAGE) 500 MG tablet 1 pill daily in the am with food 02/25/20  Yes McLean-Scocuzza, Nino Glow, MD  Omega-3 Fatty Acids (FISH OIL) 1000 MG CAPS Take 1,000 mg by mouth 2 (two) times daily.    Yes [provider]  omeprazole (PRILOSEC) 20 MG capsule Take 1 capsule (20 mg total) by mouth daily. 30 min before food 02/25/20  Yes McLean-Scocuzza, Nino Glow, MD  Blood Glucose Calibration (TRUE METRIX LEVEL 2) Normal SOLN  02/27/20   [provider]  blood glucose meter kit and supplies Dispense based on patient and  insurance preference. Use up to four times daily as directed. (FOR ICD-10 E10.9, E11.9). 02/25/20   McLean-Scocuzza, Nino Glow, MD  TRUE METRIX BLOOD GLUCOSE TEST test strip  02/27/20   [provider]  TRUEplus Lancets 33G [place]  02/27/20   [provider]    Allergies as of 06/02/2020  . (No Known Allergies)    Family History  Problem Relation Age of Onset  . Dementia Mother   . Hypertension Mother   . Arthritis Mother   . Alcohol abuse Father   . Cirrhosis Father   . Cancer Daughter        breast  . COPD Brother   . Breast cancer Sister 53  . Cancer Sister        ? type   . Dementia Sister   . Cancer Brother        ?type  . Cancer Brother        cancer ?type  . Cancer Daughter        cancer ?type died 63   . Alzheimer's disease Sister     Social History   Socioeconomic History  . Marital status: Divorced    Spouse name: Not on file  . Number of children: Not on file  . Years of education: Not on file  . Highest education level: Not on file  Occupational History  . Occupation: Astronomer covers    Comment: retired  Tobacco Use  . Smoking status: Never Smoker  . Smokeless tobacco: Never Used  Vaping Use  . Vaping Use: Never used  Substance and Sexual Activity  . Alcohol use: No    Alcohol/week: 0.0 standard drinks  . Drug use: No  . Sexual activity: Not Currently  Other Topics Concern  . Not on file  Social History Narrative   Lives with daughter [name] to go to church    Moved from [place] 3-4 years ago    No guns, wears seat belt    Never smoker    Social Determinants of Health   Financial Resource Strain: Low Risk   . Difficulty of Paying Living Expenses: Not hard at all  Food Insecurity: No Food Insecurity  . Worried About Charity fundraiser in the Last Year: Never true  . Ran Out of Food in the Last Year: Never true  Transportation Needs: No Transportation Needs  . Lack of Transportation  (Medical): No  . Lack of Transportation (Non-Medical): No  Physical Activity: Not on file  Stress: No Stress Concern Present  . Feeling of Stress : Not at all  Social Connections: Unknown  . Frequency of Communication with Friends and Family: More than three times a week  . Frequency of Social Gatherings with Friends and Family: Not on file  . Attends Religious Services: Not on file  . Active Member of Clubs or Organizations: Not on file  . Attends Archivist Meetings: Not on file  . Marital  Status: Not on file  Intimate Partner Violence: Not At Risk  . Fear of Current or Ex-Partner: No  . Emotionally Abused: No  . Physically Abused: No  . Sexually Abused: No    Review of Systems: See HPI, otherwise negative ROS  Physical Exam: BP (!) 156/77   Pulse (!) 58   Temp (!) 97.1 F (36.2 C) (Core (Comment))   Resp 16   Ht 5' 4"  (1.626 m)   Wt 113.9 kg   SpO2 98%   BMI 43.08 kg/m  General:   Alert,  pleasant and cooperative in NAD Head:  Normocephalic and atraumatic. Respiratory:  Normal work of breathing.  Impression/Plan: Taylor Shaw is here for cataract surgery.  Risks, benefits, limitations, and alternatives regarding cataract surgery have been reviewed with the patient.  Questions have been answered.  All parties agreeable.   Benay Pillow, MD  07/05/2020, 7:14 AM

## 2020-07-05 NOTE — Anesthesia Preprocedure Evaluation (Signed)
Anesthesia Evaluation  Patient identified by MRN, date of birth, ID band Patient awake    History of Anesthesia Complications Negative for: history of anesthetic complications  Airway Mallampati: II  TM Distance: >3 FB Neck ROM: Full    Dental no notable dental hx.    Pulmonary sleep apnea and Continuous Positive Airway Pressure Ventilation ,    Pulmonary exam normal        Cardiovascular Exercise Tolerance: Good hypertension, Pt. on medications and Pt. on home beta blockers Normal cardiovascular exam     Neuro/Psych    GI/Hepatic GERD  Medicated,  Endo/Other  diabetes, Type 2Hypothyroidism   Renal/GU      Musculoskeletal   Abdominal   Peds  Hematology   Anesthesia Other Findings   Reproductive/Obstetrics                             Anesthesia Physical  Anesthesia Plan  ASA: III  Anesthesia Plan: MAC   Post-op Pain Management:    Induction: Intravenous  PONV Risk Score and Plan: 2 and Midazolam, Treatment may vary due to age or medical condition and TIVA  Airway Management Planned: Natural Airway and Nasal Cannula  Additional Equipment: None  Intra-op Plan:   Post-operative Plan:   Informed Consent: I have reviewed the patients History and Physical, chart, labs and discussed the procedure including the risks, benefits and alternatives for the proposed anesthesia with the patient or authorized representative who has indicated his/her understanding and acceptance.       Plan Discussed with: CRNA  Anesthesia Plan Comments:         Anesthesia Quick Evaluation

## 2020-07-05 NOTE — Anesthesia Procedure Notes (Signed)
Procedure Name: MAC Date/Time: 07/05/2020 7:33 AM Performed by: Silvana Newness, CRNA Pre-anesthesia Checklist: Patient identified, Emergency Drugs available, Suction available, Patient being monitored and Timeout performed Patient Re-evaluated:Patient Re-evaluated prior to induction Oxygen Delivery Method: Nasal cannula Placement Confirmation: positive ETCO2

## 2020-07-05 NOTE — Transfer of Care (Signed)
Immediate Anesthesia Transfer of Care Note  Patient: Taylor Shaw  Procedure(s) Performed: CATARACT EXTRACTION PHACO AND INTRAOCULAR LENS PLACEMENT (IOC) RIGHT DIABETIC 3.20 00:26.3 (Right Eye)  Patient Location: PACU  Anesthesia Type: MAC  Level of Consciousness: awake, alert  and patient cooperative  Airway and Oxygen Therapy: Patient Spontanous Breathing and Patient connected to supplemental oxygen  Post-op Assessment: Post-op Vital signs reviewed, Patient's Cardiovascular Status Stable, Respiratory Function Stable, Patent Airway and No signs of Nausea or vomiting  Post-op Vital Signs: Reviewed and stable  Complications: No complications documented.

## 2020-07-05 NOTE — Anesthesia Postprocedure Evaluation (Signed)
Anesthesia Post Note  Patient: Taylor Shaw  Procedure(s) Performed: CATARACT EXTRACTION PHACO AND INTRAOCULAR LENS PLACEMENT (IOC) RIGHT DIABETIC 3.20 00:26.3 (Right Eye)     Patient location during evaluation: PACU Anesthesia Type: MAC Level of consciousness: awake and alert and oriented Pain management: satisfactory to patient Vital Signs Assessment: post-procedure vital signs reviewed and stable Respiratory status: spontaneous breathing, nonlabored ventilation and respiratory function stable Cardiovascular status: blood pressure returned to baseline and stable Postop Assessment: Adequate PO intake and No signs of nausea or vomiting Anesthetic complications: no   No complications documented.  Raliegh Ip

## 2020-07-19 ENCOUNTER — Other Ambulatory Visit (INDEPENDENT_AMBULATORY_CARE_PROVIDER_SITE_OTHER): Payer: Medicare HMO

## 2020-07-19 ENCOUNTER — Other Ambulatory Visit: Payer: Self-pay

## 2020-07-19 ENCOUNTER — Other Ambulatory Visit: Payer: Self-pay | Admitting: Internal Medicine

## 2020-07-19 DIAGNOSIS — E039 Hypothyroidism, unspecified: Secondary | ICD-10-CM

## 2020-07-19 DIAGNOSIS — R7989 Other specified abnormal findings of blood chemistry: Secondary | ICD-10-CM | POA: Diagnosis not present

## 2020-07-19 LAB — TSH: TSH: 5.12 u[IU]/mL — ABNORMAL HIGH (ref 0.35–4.50)

## 2020-07-19 MED ORDER — LEVOTHYROXINE SODIUM 75 MCG PO TABS: 75.0000 ug | ORAL_TABLET | Freq: Every day | ORAL | 3 refills | Status: DC

## 2020-07-22 ENCOUNTER — Telehealth: Payer: Self-pay | Admitting: Internal Medicine

## 2020-07-22 NOTE — Telephone Encounter (Signed)
Patient needs to be scheduled for an early morning follow up in 3 months.  Will do labs at her appointment.   Spoke with Patient's daughter and she will call back in to schedule.

## 2020-09-07 ENCOUNTER — Ambulatory Visit
Admission: RE | Admit: 2020-09-07 | Discharge: 2020-09-07 | Disposition: A | Discharge status: Home / Self Care | Payer: Medicare HMO | Source: Ambulatory Visit | Attending: Internal Medicine | Admitting: Internal Medicine

## 2020-09-07 ENCOUNTER — Other Ambulatory Visit: Payer: Self-pay

## 2020-09-07 DIAGNOSIS — Z1231 Encounter for screening mammogram for malignant neoplasm of breast: Secondary | ICD-10-CM | POA: Insufficient documentation

## 2020-09-07 DIAGNOSIS — Z780 Asymptomatic menopausal state: Secondary | ICD-10-CM | POA: Diagnosis not present

## 2020-09-07 DIAGNOSIS — E2839 Other primary ovarian failure: Secondary | ICD-10-CM | POA: Insufficient documentation

## 2020-10-26 ENCOUNTER — Encounter: Payer: Self-pay | Admitting: Internal Medicine

## 2020-10-26 ENCOUNTER — Other Ambulatory Visit: Payer: Self-pay

## 2020-10-26 ENCOUNTER — Ambulatory Visit (INDEPENDENT_AMBULATORY_CARE_PROVIDER_SITE_OTHER): Payer: Medicare HMO | Admitting: Internal Medicine

## 2020-10-26 VITALS — BP 132/70 | HR 68 | Temp 96.6°F | Resp 16 | Ht 64.0 in | Wt 259.2 lb

## 2020-10-26 DIAGNOSIS — L602 Onychogryphosis: Secondary | ICD-10-CM | POA: Diagnosis not present

## 2020-10-26 DIAGNOSIS — R0602 Shortness of breath: Secondary | ICD-10-CM | POA: Diagnosis not present

## 2020-10-26 DIAGNOSIS — E1159 Type 2 diabetes mellitus with other circulatory complications: Secondary | ICD-10-CM

## 2020-10-26 DIAGNOSIS — K429 Umbilical hernia without obstruction or gangrene: Secondary | ICD-10-CM

## 2020-10-26 DIAGNOSIS — E039 Hypothyroidism, unspecified: Secondary | ICD-10-CM | POA: Diagnosis not present

## 2020-10-26 DIAGNOSIS — R140 Abdominal distension (gaseous): Secondary | ICD-10-CM | POA: Diagnosis not present

## 2020-10-26 DIAGNOSIS — I152 Hypertension secondary to endocrine disorders: Secondary | ICD-10-CM | POA: Diagnosis not present

## 2020-10-26 HISTORY — DX: Umbilical hernia without obstruction or gangrene: K42.9

## 2020-10-26 LAB — CBC WITH DIFFERENTIAL/PLATELET
Basophils Absolute: 0.1 10*3/uL (ref 0.0–0.1)
Basophils Relative: 1 % (ref 0.0–3.0)
Eosinophils Absolute: 0.2 10*3/uL (ref 0.0–0.7)
Eosinophils Relative: 2.9 % (ref 0.0–5.0)
HCT: 37.2 % (ref 36.0–46.0)
Hemoglobin: 12.2 g/dL (ref 12.0–15.0)
Lymphocytes Relative: 26.8 % (ref 12.0–46.0)
Lymphs Abs: 2.3 10*3/uL (ref 0.7–4.0)
MCHC: 32.9 g/dL (ref 30.0–36.0)
MCV: 80.9 fl (ref 78.0–100.0)
Monocytes Absolute: 0.5 10*3/uL (ref 0.1–1.0)
Monocytes Relative: 6.2 % (ref 3.0–12.0)
Neutro Abs: 5.4 10*3/uL (ref 1.4–7.7)
Neutrophils Relative %: 63.1 % (ref 43.0–77.0)
Platelets: 236 10*3/uL (ref 150.0–400.0)
RBC: 4.6 Mil/uL (ref 3.87–5.11)
RDW: 14 % (ref 11.5–15.5)
WBC: 8.6 10*3/uL (ref 4.0–10.5)

## 2020-10-26 LAB — COMPREHENSIVE METABOLIC PANEL
ALT: 6 U/L (ref 0–35)
AST: 13 U/L (ref 0–37)
Albumin: 4 g/dL (ref 3.5–5.2)
Alkaline Phosphatase: 60 U/L (ref 39–117)
BUN: 17 mg/dL (ref 6–23)
CO2: 32 mEq/L (ref 19–32)
Calcium: 9.3 mg/dL (ref 8.4–10.5)
Chloride: 100 mEq/L (ref 96–112)
Creatinine, Ser: 0.88 mg/dL (ref 0.40–1.20)
GFR: 62.85 mL/min (ref >60.00)
Glucose, Bld: 125 mg/dL — ABNORMAL HIGH (ref 70–99)
Potassium: 3.7 mEq/L (ref 3.5–5.1)
Sodium: 140 mEq/L (ref 135–145)
Total Bilirubin: 0.7 mg/dL (ref 0.2–1.2)
Total Protein: 7.1 g/dL (ref 6.0–8.3)

## 2020-10-26 LAB — LIPID PANEL
Cholesterol: 137 mg/dL (ref 0–200)
HDL: 55.2 mg/dL (ref >39.00)
LDL Cholesterol: 67 mg/dL (ref 0–99)
NonHDL: 81.41
Total CHOL/HDL Ratio: 2
Triglycerides: 74 mg/dL (ref 0.0–149.0)
VLDL: 14.8 mg/dL (ref 0.0–40.0)

## 2020-10-26 LAB — HEMOGLOBIN A1C: Hgb A1c MFr Bld: 6.4 % (ref 4.6–6.5)

## 2020-10-26 LAB — TSH: TSH: 3.02 u[IU]/mL (ref 0.35–4.50)

## 2020-10-26 NOTE — Progress Notes (Signed)
Chief Complaint  Patient presents with  . Follow-up    3 month follow up    F/u  1. Dm 2 see # 2  2. htn bp controlled on norvasc 10 mg qd coreg 25 mg bid, hydralazone 10 bid, hyzaar 100-25 mg qd on metformin 500 mg, on lipitor 10 mg qd  3. Sob with exertion walking a few feet 6-10 feet used to work in tobacco [place], never smoking worked in [employer] x 7 years and 2nd hand smoke from husband and mom Denies CP  pfts 12/2018 moderate restriction consider repeat with [facility] pulm  4. Long toenails unable to cut  5. Umbilical hernia and left sided ab bloating new will work up denies pain     Review of Systems  Constitutional: Negative for weight loss.  HENT: Negative for hearing loss.   Eyes: Negative for blurred vision.  Respiratory: Positive for shortness of breath.   Gastrointestinal: Negative for abdominal pain.  Musculoskeletal: Negative for falls and joint pain.  Skin: Negative for rash.  Neurological: Negative for headaches.  Psychiatric/Behavioral: Negative for depression.   Past Medical History:  Diagnosis Date  . Arthritis   . Diabetes ([place]) 02/19/2015  . Diverticulosis 12/07/2016   Sigmoid Colon  . Diverticulosis of sigmoid colon 12/08/2016  . Elevated blood sugar 01/15/2015  . GERD (gastroesophageal reflux disease)   . Hemorrhoids   . History of blood transfusion   . Hyperlipidemia   . Hypertension 01/15/2015  . Obesity 01/15/2015  . OSA on CPAP    Past Surgical History:  Procedure Laterality Date  . BOTOX INJECTION N/A 03/27/2018   Procedure: BOTOX INJECTION;  Surgeon: Jules Husbands, MD;  Location: [facility];  Service: General;  Laterality: N/A;  . CATARACT EXTRACTION W/PHACO Left 05/31/2020   Procedure: CATARACT EXTRACTION PHACO AND INTRAOCULAR LENS PLACEMENT ([place]) LEFT;  Surgeon: Eulogio Bear, MD;  Location: [place];  Service: Ophthalmology;  Laterality: Left;  4.16 0:40.4  . CATARACT EXTRACTION W/PHACO Right 07/05/2020   Procedure: CATARACT  EXTRACTION PHACO AND INTRAOCULAR LENS PLACEMENT (IOC) RIGHT DIABETIC 3.20 00:26.3;  Surgeon: Eulogio Bear, MD;  Location: [place];  Service: Ophthalmology;  Laterality: Right;  Diabetic - oral meds  . COLONOSCOPY    . COLONOSCOPY WITH PROPOFOL N/A 12/07/2016   Procedure: COLONOSCOPY WITH PROPOFOL;  Surgeon: [name], MD;  Location: [place];  Service: Endoscopy;  Laterality: N/A;  Diabetic - oral meds  . EVALUATION UNDER ANESTHESIA WITH ANAL FISSUROTOMY N/A 03/27/2018   Procedure: EXAM UNDER ANESTHESIA, CHEMICAL SPHINCTEROTOMY;  Surgeon: Jules Husbands, MD;  Location: [facility];  Service: General;  Laterality: N/A;  . HEMORRHOID SURGERY N/A 06/13/2018   Procedure: [id number];  Surgeon: Jules Husbands, MD;  Location: [facility];  Service: General;  Laterality: N/A;  . JOINT REPLACEMENT     b/l hips in [place] 2004 and 2005 Dr. Alfonso Ramus   . POLYPECTOMY  12/07/2016   Procedure: POLYPECTOMY;  Surgeon: [name], MD;  Location: [place];  Service: Endoscopy;;  . RECTAL EXAM UNDER ANESTHESIA N/A 06/13/2018   Procedure: RECTAL EXAM UNDER ANESTHESIA;  Surgeon: Jules Husbands, MD;  Location: [facility];  Service: General;  Laterality: N/A;  . TOTAL HIP ARTHROPLASTY Bilateral   . TUBAL LIGATION     Family History  Problem Relation Age of Onset  . Dementia Mother   . Hypertension Mother   . Arthritis Mother   . Alcohol abuse Father   . Cirrhosis Father   .  Cancer Daughter        breast  . COPD Brother   . Breast cancer Sister 49  . Cancer Sister        ? type   . Dementia Sister   . Cancer Brother        ?type  . Cancer Brother        cancer ?type  . Cancer Daughter        cancer ?type died 61   . Alzheimer's disease Sister    Social History   Socioeconomic History  . Marital status: Divorced    Spouse name: Not on file  . Number of children: Not on file  . Years of education: Not on file  . Highest education level: Not on file   Occupational History  . Occupation: Astronomer covers    Comment: retired  Tobacco Use  . Smoking status: Never Smoker  . Smokeless tobacco: Never Used  Vaping Use  . Vaping Use: Never used  Substance and Sexual Activity  . Alcohol use: No    Alcohol/week: 0.0 standard drinks  . Drug use: No  . Sexual activity: Not Currently  Other Topics Concern  . Not on file  Social History Narrative   Lives with daughter [name] to go to church    Moved from [place] 3-4 years ago    No guns, wears seat belt    Never smoker    Social Determinants of Health   Financial Resource Strain: Low Risk   . Difficulty of Paying Living Expenses: Not hard at all  Food Insecurity: No Food Insecurity  . Worried About Charity fundraiser in the Last Year: Never true  . Ran Out of Food in the Last Year: Never true  Transportation Needs: No Transportation Needs  . Lack of Transportation (Medical): No  . Lack of Transportation (Non-Medical): No  Physical Activity: Not on file  Stress: No Stress Concern Present  . Feeling of Stress : Not at all  Social Connections: Unknown  . Frequency of Communication with Friends and Family: More than three times a week  . Frequency of Social Gatherings with Friends and Family: Not on file  . Attends Religious Services: Not on file  . Active Member of Clubs or Organizations: Not on file  . Attends Archivist Meetings: Not on file  . Marital Status: Not on file  Intimate Partner Violence: Not At Risk  . Fear of Current or Ex-Partner: No  . Emotionally Abused: No  . Physically Abused: No  . Sexually Abused: No   Current Meds  Medication Sig  . amLODipine (NORVASC) 10 MG tablet Take 1 tablet (10 mg total) by mouth daily.  Marland Kitchen atorvastatin (LIPITOR) 10 MG tablet Take 1 tablet (10 mg total) by mouth daily at 6 PM.  . Blood Glucose Calibration (TRUE METRIX LEVEL 2) Normal SOLN   . blood glucose meter kit and supplies  Dispense based on patient and insurance preference. Use up to four times daily as directed. (FOR ICD-10 E10.9, E11.9).  . Calcium Carb-Cholecalciferol 600-800 MG-UNIT TABS Take 1 tablet by mouth 2 (two) times daily.  . carvedilol (COREG) 25 MG tablet Take 1 tablet (25 mg total) by mouth 2 (two) times daily with a meal.  . docusate sodium (COLACE) 100 MG capsule Take 100 mg by mouth 2 (two) times daily as needed.  . hydrALAZINE (APRESOLINE) 10 MG tablet Take 1 tablet (10 mg total)  by mouth in the morning and at bedtime.  Marland Kitchen levothyroxine (SYNTHROID) 75 MCG tablet Take 1 tablet (75 mcg total) by mouth daily before breakfast. 30 minutes before food  . losartan-hydrochlorothiazide (HYZAAR) 100-25 MG tablet Take 1 tablet by mouth daily. In am  . metFORMIN (GLUCOPHAGE) 500 MG tablet 1 pill daily in the am with food  . Omega-3 Fatty Acids (FISH OIL) 1000 MG CAPS Take 1,000 mg by mouth 2 (two) times daily.   Marland Kitchen omeprazole (PRILOSEC) 20 MG capsule Take 1 capsule (20 mg total) by mouth daily. 30 min before food  . TRUE METRIX BLOOD GLUCOSE TEST test strip   . TRUEplus Lancets 33G MISC    No Known Allergies No results found for this or any previous visit (from the past 2160 hour(s)). Objective  Body mass index is 44.49 kg/m. Wt Readings from Last 3 Encounters:  10/26/20 259 lb 3.2 oz (117.6 kg)  07/05/20 251 lb (113.9 kg)  05/31/20 258 lb (117 kg)   Temp Readings from Last 3 Encounters:  10/26/20 (!) 96.6 F (35.9 C) (Temporal)  07/05/20 97.6 F (36.4 C)  05/31/20 (!) 97.2 F (36.2 C)   BP Readings from Last 3 Encounters:  10/26/20 132/70  07/05/20 124/61  05/31/20 140/81   Pulse Readings from Last 3 Encounters:  10/26/20 68  07/05/20 (!) 49  05/31/20 (!) 47    Physical Exam Vitals and nursing note reviewed.  Constitutional:      Appearance: Normal appearance. She is well-developed and well-groomed. She is morbidly obese.  HENT:     Head: Normocephalic and atraumatic.  Eyes:      Conjunctiva/sclera: Conjunctivae normal.     Pupils: Pupils are equal, round, and reactive to light.  Cardiovascular:     Rate and Rhythm: Normal rate and regular rhythm.     Heart sounds: Normal heart sounds. No murmur heard.   Pulmonary:     Effort: Pulmonary effort is normal.     Breath sounds: Normal breath sounds.  Abdominal:     Hernia: A hernia is present. Hernia is present in the umbilical area.  Skin:    General: Skin is warm and dry.  Neurological:     General: No focal deficit present.     Mental Status: She is alert and oriented to person, place, and time. Mental status is at baseline.     Gait: Gait normal.  Psychiatric:        Attention and Perception: Attention and perception normal.        Mood and Affect: Mood and affect normal.        Speech: Speech normal.        Behavior: Behavior normal. Behavior is cooperative.        Thought Content: Thought content normal.        Cognition and Memory: Cognition and memory normal.        Judgment: Judgment normal.     Assessment  Plan  Umbilical hernia without obstruction and without gangrene with ab bloating- Plan: CT Abdomen Pelvis Wo Contrast  SOB (shortness of breath) on exertion - Plan: Ambulatory referral to Cardiology echo, ekg, nm stress test  F/u with [facility] pulm consider repeat pfts  cxtr 12/2018 negative   Abdominal bloating - Plan: CT Abdomen Pelvis Wo Contrast  Overgrown toenails - Plan: Ambulatory referral to Podiatry  Hypertension associated controlled with diabetes ([place]) - Plan: Comprehensive metabolic panel, CBC with Differential/Platelet, Lipid panel, Hemoglobin A1c, Urinalysis, Routine w reflex microscopic, Microalbumin /  creatinine urine ratio norvasc 10 mg qd coreg 25 mg bid, hydralazone 10 bid, hyzaar 100-25 mg qd on metformin 500 mg, on lipitor 10 mg qd  On metformin 500 mg   Hypothyroidism, unspecified type - Plan: TSH  On levo 75 mcg qd   HM Flu shotutd  Tdap had 07/04/2018 pna 23  utd Consider prevnar 20 In future  prevnarutd Had zostervax, shingrix had2/2 2/2covid 19 rec booster x 2  Mammogram4/5/22 negative Out of age window pap  Colonoscopy had 12/07/16 IH, diverticulosis and tubular adenoma f/u in 5 years Dr. Allen Norris  DEXA get report prior [facility]CP Dr. Qu[facility] pt had 5 years ago -09/07/20 normal   OSA f/u pulm, pfts sch 11/12/2018 and f/u pulm 11/19/2018, referred again  dentist appt sch 10/20/19  Provider: Dr. Olivia Mackie McLean-Scocuzza-Internal Medicine

## 2020-10-26 NOTE — Patient Instructions (Addendum)
Consider pfizer 3rd and 4th shots  [facility] [place], [facility], [facility], [facility]   Call [facility] and schedule your yearly appt and tell them about your shortness of breath  Umbilical Hernia, Adult  A hernia is a bulge of tissue that pushes through an opening between muscles. An umbilical hernia happens in the abdomen, near the belly button (umbilicus). The hernia may contain tissues from the small intestine, large intestine, or fatty tissue covering the intestines (omentum). Umbilical hernias in adults tend to get worse over time, and they require surgical treatment. There are several types of umbilical hernias. You may have:  A hernia located just above or below the umbilicus (indirect hernia). This is the most common type of umbilical hernia in adults.  A hernia that forms through an opening formed by the umbilicus (direct hernia).  A hernia that comes and goes (reducible hernia). A reducible hernia may be visible only when you strain, lift something heavy, or cough. This type of hernia can be pushed back into the abdomen (reduced).  A hernia that traps abdominal tissue inside the hernia (incarcerated hernia). This type of hernia cannot be reduced.  A hernia that cuts off blood flow to the tissues inside the hernia (strangulated hernia). The tissues can start to die if this happens. This type of hernia requires emergency treatment. What are the causes? An umbilical hernia happens when tissue inside the abdomen presses on a weak area of the abdominal muscles. What increases the risk? You may have a greater risk of this condition if you:  Are obese.  Have had several pregnancies.  Have a buildup of fluid inside your abdomen (ascites).  Have had surgery that weakens the abdominal muscles. What are the signs or symptoms? The main symptom of this condition is a painless bulge at or near the belly button. A reducible hernia may be visible only when you strain, lift  something heavy, or cough. Other symptoms may include:  Dull pain.  A feeling of pressure. Symptoms of a strangulated hernia may include:  Pain that gets increasingly worse.  Nausea and vomiting.  Pain when pressing on the hernia.  Skin over the hernia becoming red or purple.  Constipation.  Blood in the stool. How is this diagnosed? This condition may be diagnosed based on:  A physical exam. You may be asked to cough or strain while standing. These actions increase the pressure inside your abdomen and force the hernia through the opening in your muscles. Your health care provider may try to reduce the hernia by pressing on it.  Your symptoms and medical history. How is this treated? Surgery is the only treatment for an umbilical hernia. Surgery for a strangulated hernia is done as soon as possible. If you have a small hernia that is not incarcerated, you may need to lose weight before having surgery. Follow these instructions at home:  Lose weight, if told by your health care provider.  Do not try to push the hernia back in.  Watch your hernia for any changes in color or size. Tell your health care provider if any changes occur.  You may need to avoid activities that increase pressure on your hernia.  Do not lift anything that is heavier than 10 lb (4.5 kg) until your health care provider says that this is safe.  Take over-the-counter and prescription medicines only as told by your health care provider.  Keep all follow-up visits as told by your health care provider. This is  important. Contact a health care provider if:  Your hernia gets larger.  Your hernia becomes painful. Get help right away if:  You develop sudden, severe pain near the area of your hernia.  You have pain as well as nausea or vomiting.  You have pain and the skin over your hernia changes color.  You develop a fever. This information is not intended to replace advice given to you by your  health care provider. Make sure you discuss any questions you have with your health care provider. Document Revised: 07/04/2017 Document Reviewed: 11/20/2016 Elsevier Patient Education  [place] of Breath, Adult Shortness of breath is when a person has trouble breathing enough air or when a person feels like she or he is having trouble breathing in enough air. Shortness of breath could be a sign of a medical problem. Follow these instructions at home:  Pay attention to any changes in your symptoms.  Do not use any products that contain nicotine or tobacco, such as cigarettes, e-cigarettes, and chewing tobacco.  Do not smoke. Smoking is a common cause of shortness of breath. If you need help quitting, ask your health care provider.  Avoid things that can irritate your airways, such as: ? Mold. ? Dust. ? Air pollution. ? Chemical fumes. ? Things that can cause allergy symptoms (allergens), if you have allergies.  Keep your living space clean and free of mold and dust.  Rest as needed. Slowly return to your usual activities.  Take over-the-counter and prescription medicines only as told by your health care provider. This includes oxygen therapy and inhaled medicines.  Keep all follow-up visits as told by your health care provider. This is important.   Contact a health care provider if:  Your condition does not improve as soon as expected.  You have a hard time doing your normal activities, even after you rest.  You have new symptoms. Get help right away if:  Your shortness of breath gets worse.  You have shortness of breath when you are resting.  You feel light-headed or you faint.  You have a cough that is not controlled with medicines.  You cough up blood.  You have pain with breathing.  You have pain in your chest, arms, shoulders, or abdomen.  You have a fever.  You cannot walk up stairs or exercise the way that you normally do. These  symptoms may represent a serious problem that is an emergency. Do not wait to see if the symptoms will go away. Get medical help right away. Call your local emergency services (911 in the [place] Do not drive yourself to the hospital. Summary  Shortness of breath is when a person has trouble breathing enough air. It can be a sign of a medical problem.  Avoid things that irritate your lungs, such as smoking, pollution, mold, and dust.  Pay attention to changes in your symptoms and contact your health care provider if you have a hard time completing daily activities because of shortness of breath. This information is not intended to replace advice given to you by your health care provider. Make sure you discuss any questions you have with your health care provider. Document Revised: 10/22/2017 Document Reviewed: 10/22/2017 Elsevier Patient Education  2021 [employer].

## 2020-10-27 LAB — URINALYSIS, ROUTINE W REFLEX MICROSCOPIC
Bacteria, UA: NONE SEEN /HPF
Bilirubin Urine: NEGATIVE
Glucose, UA: NEGATIVE
Hgb urine dipstick: NEGATIVE
Hyaline Cast: NONE SEEN /LPF
Ketones, ur: NEGATIVE
Nitrite: NEGATIVE
Protein, ur: NEGATIVE
RBC / HPF: NONE SEEN /HPF (ref 0–2)
Specific Gravity, Urine: 1.005 (ref 1.001–1.035)
Squamous Epithelial / HPF: NONE SEEN /HPF (ref <=5)
pH: 6.5 (ref 5.0–8.0)

## 2020-10-27 LAB — MICROALBUMIN / CREATININE URINE RATIO
Creatinine, Urine: 28 mg/dL (ref 20–275)
Microalb Creat Ratio: 36 mcg/mg creat — ABNORMAL HIGH (ref <30)
Microalb, Ur: 1 mg/dL

## 2020-10-27 LAB — MICROSCOPIC MESSAGE

## 2020-10-29 ENCOUNTER — Other Ambulatory Visit: Payer: Self-pay

## 2020-10-29 ENCOUNTER — Ambulatory Visit (INDEPENDENT_AMBULATORY_CARE_PROVIDER_SITE_OTHER): Payer: Medicare HMO | Admitting: Cardiovascular Disease

## 2020-10-29 ENCOUNTER — Encounter: Payer: Self-pay | Admitting: Cardiovascular Disease

## 2020-10-29 VITALS — BP 128/68 | HR 53 | Ht 64.0 in | Wt 259.1 lb

## 2020-10-29 DIAGNOSIS — E785 Hyperlipidemia, unspecified: Secondary | ICD-10-CM

## 2020-10-29 DIAGNOSIS — I209 Angina pectoris, unspecified: Secondary | ICD-10-CM | POA: Diagnosis not present

## 2020-10-29 DIAGNOSIS — I10 Essential (primary) hypertension: Secondary | ICD-10-CM | POA: Diagnosis not present

## 2020-10-29 DIAGNOSIS — R0602 Shortness of breath: Secondary | ICD-10-CM

## 2020-10-29 NOTE — Progress Notes (Signed)
Cardiology Office Note   Date:  10/29/2020   ID:  Taylor Shaw, DOB [birth year], MRN [id number]  PCP:  McLean-Scocuzza, Nino Glow, MD  Cardiologist:   Kathlyn Sacramento, MD   Chief Complaint  Patient presents with  . New Patient (Initial Visit)    Ref by Dr. Terese Door for HTN, shortness of breath on exertion, LE edema & fatigue. Medications reviewed by the patient verbally.       History of Present Illness: Taylor Shaw is a 80 y.o. female who was referred by Dr. Terese Door for evaluation of exertional dyspnea.  She has no prior cardiac history.  She has past medical history of type 2 diabetes, essential hypertension, GERD, hyperlipidemia, obesity, and obstructive sleep apnea on CPAP.  She reports progressive symptoms of exertional dyspnea Currently happening with minimal exertion even just walking short distance on flat level.  No orthopnea, PND or leg edema.  She uses her CPAP on a regular basis.  She denies chest discomfort or tightness.  No recent cardiac evaluation.  She does not exercise on a regular basis.  She was told that she occasionally wheezes but she has no history of lung disease.  She is not a smoker and does not drink alcohol.  There is no family history of premature coronary artery disease.  Past Medical History:  Diagnosis Date  . Arthritis   . Diabetes ([place]) 02/19/2015  . Diverticulosis 12/07/2016   Sigmoid Colon  . Diverticulosis of sigmoid colon 12/08/2016  . Elevated blood sugar 01/15/2015  . GERD (gastroesophageal reflux disease)   . Hemorrhoids   . History of blood transfusion   . Hyperlipidemia   . Hypertension 01/15/2015  . Obesity 01/15/2015  . OSA on CPAP     Past Surgical History:  Procedure Laterality Date  . BOTOX INJECTION N/A 03/27/2018   Procedure: BOTOX INJECTION;  Surgeon: Jules Husbands, MD;  Location: [facility];  Service: General;  Laterality: N/A;  . CATARACT EXTRACTION W/PHACO Left 05/31/2020   Procedure: CATARACT  EXTRACTION PHACO AND INTRAOCULAR LENS PLACEMENT ([place]) LEFT;  Surgeon: Eulogio Bear, MD;  Location: [place];  Service: Ophthalmology;  Laterality: Left;  4.16 0:40.4  . CATARACT EXTRACTION W/PHACO Right 07/05/2020   Procedure: CATARACT EXTRACTION PHACO AND INTRAOCULAR LENS PLACEMENT ([facility]) RIGHT DIABETIC 3.20 00:26.3;  Surgeon: Eulogio Bear, MD;  Location: [place];  Service: Ophthalmology;  Laterality: Right;  Diabetic - oral meds  . COLONOSCOPY    . COLONOSCOPY WITH PROPOFOL N/A 12/07/2016   Procedure: COLONOSCOPY WITH PROPOFOL;  Surgeon: [name], MD;  Location: [place];  Service: Endoscopy;  Laterality: N/A;  Diabetic - oral meds  . EVALUATION UNDER ANESTHESIA WITH ANAL FISSUROTOMY N/A 03/27/2018   Procedure: EXAM UNDER ANESTHESIA, CHEMICAL SPHINCTEROTOMY;  Surgeon: Jules Husbands, MD;  Location: [facility];  Service: General;  Laterality: N/A;  . HEMORRHOID SURGERY N/A 06/13/2018   Procedure: [id number];  Surgeon: Jules Husbands, MD;  Location: [facility];  Service: General;  Laterality: N/A;  . JOINT REPLACEMENT     b/l hips in [place] 2004 and 2005 Dr. Alfonso Ramus   . POLYPECTOMY  12/07/2016   Procedure: POLYPECTOMY;  Surgeon: [name], MD;  Location: [place];  Service: Endoscopy;;  . RECTAL EXAM UNDER ANESTHESIA N/A 06/13/2018   Procedure: RECTAL EXAM UNDER ANESTHESIA;  Surgeon: Jules Husbands, MD;  Location: [facility];  Service: General;  Laterality: N/A;  . TOTAL HIP ARTHROPLASTY Bilateral   .  TUBAL LIGATION       Current Outpatient Medications  Medication Sig Dispense Refill  . amLODipine (NORVASC) 10 MG tablet Take 1 tablet (10 mg total) by mouth daily. 90 tablet 3  . atorvastatin (LIPITOR) 10 MG tablet Take 1 tablet (10 mg total) by mouth daily at 6 PM. 90 tablet 3  . Blood Glucose Calibration (TRUE METRIX LEVEL 2) Normal SOLN     . blood glucose meter kit and supplies Dispense based on patient and insurance preference.  Use up to four times daily as directed. (FOR ICD-10 E10.9, E11.9). 1 each 0  . Calcium Carb-Cholecalciferol 600-800 MG-UNIT TABS Take 1 tablet by mouth 2 (two) times daily.    . carvedilol (COREG) 25 MG tablet Take 1 tablet (25 mg total) by mouth 2 (two) times daily with a meal. 180 tablet 3  . docusate sodium (COLACE) 100 MG capsule Take 100 mg by mouth 2 (two) times daily as needed.    . hydrALAZINE (APRESOLINE) 10 MG tablet Take 1 tablet (10 mg total) by mouth in the morning and at bedtime. 180 tablet 3  . levothyroxine (SYNTHROID) 75 MCG tablet Take 1 tablet (75 mcg total) by mouth daily before breakfast. 30 minutes before food 90 tablet 3  . losartan-hydrochlorothiazide (HYZAAR) 100-25 MG tablet Take 1 tablet by mouth daily. In am 90 tablet 3  . metFORMIN (GLUCOPHAGE) 500 MG tablet 1 pill daily in the am with food 90 tablet 3  . Omega-3 Fatty Acids (FISH OIL) 1000 MG CAPS Take 1,000 mg by mouth 2 (two) times daily.     Marland Kitchen omeprazole (PRILOSEC) 20 MG capsule Take 1 capsule (20 mg total) by mouth daily. 30 min before food 90 capsule 3  . TRUE METRIX BLOOD GLUCOSE TEST test strip     . TRUEplus Lancets 33G MISC      No current facility-administered medications for this visit.    Allergies:   Patient has no known allergies.    Social History:  The patient  reports that she has never smoked. She has never used smokeless tobacco. She reports that she does not drink alcohol and does not use drugs.   Family History:  The patient's family history includes Alcohol abuse in her father; Alzheimer's disease in her sister; Arthritis in her mother; Breast cancer (age of onset: 77) in her sister; COPD in her brother; Cancer in her brother, brother, daughter, daughter, and sister; Cirrhosis in her father; Dementia in her mother and sister; Hypertension in her mother.    ROS:  Please see the history of present illness.   Otherwise, review of systems are positive for none.   All other systems are reviewed  and negative.    PHYSICAL EXAM: VS:  BP 128/68 (BP Location: Right Arm, Patient Position: Sitting, Cuff Size: Large)   Pulse (!) 53   Ht 5' 4" (1.626 m)   Wt 259 lb 2 oz (117.5 kg)   SpO2 99%   BMI 44.48 kg/m  , BMI Body mass index is 44.48 kg/m. GEN: Well nourished, well developed, in no acute distress  HEENT: normal  Neck: no JVD, carotid bruits, or masses Cardiac: RRR; no murmurs, rubs, or gallops,no edema  Respiratory:  clear to auscultation bilaterally, normal work of breathing GI: soft, nontender, nondistended, + BS MS: no deformity or atrophy  Skin: warm and dry, no rash Neuro:  Strength and sensation are intact Psych: euthymic mood, full affect   EKG:  EKG is ordered today. The ekg  ordered today demonstrates sinus bradycardia with no significant ST or T wave changes.   Recent Labs: 10/26/2020: ALT 6; BUN 17; Creatinine, Ser 0.88; Hemoglobin 12.2; Platelets 236.0; Potassium 3.7; Sodium 140; TSH 3.02    Lipid Panel    Component Value Date/Time   CHOL 137 10/26/2020 0852   CHOL 103 01/19/2015 0811   TRIG 74.0 10/26/2020 0852   HDL 55.20 10/26/2020 0852   HDL 50 01/19/2015 0811   CHOLHDL 2 10/26/2020 0852   VLDL 14.8 10/26/2020 0852   LDLCALC 67 10/26/2020 0852   LDLCALC 42 01/19/2015 0811      Wt Readings from Last 3 Encounters:  10/29/20 259 lb 2 oz (117.5 kg)  10/26/20 259 lb 3.2 oz (117.6 kg)  07/05/20 251 lb (113.9 kg)        PAD Screen 10/29/2020  Previous PAD dx? No  Previous surgical procedure? No  Pain with walking? No  Feet/toe relief with dangling? No  Painful, non-healing ulcers? No  Extremities discolored? No      ASSESSMENT AND PLAN:  1.  Severe exertional dyspnea concerning for anginal equivalent given prolonged history of diabetes mellitus and multiple other risk factors.  There is a possibility of physical deconditioning as well. I recommend evaluation with cardiac CTA with FFR if needed. In addition, I will obtain an  echocardiogram to evaluate LV systolic/diastolic function, pulmonary pressures given sleep apnea as well as valvular structure.  2.  Essential hypertension: Blood pressure is controlled on current medications.  3.  Hyperlipidemia: Currently on atorvastatin 10 mg once daily.  Recent lipid profile showed an LDL of 67.  4.  Type 2 diabetes: Generally well controlled with most recent hemoglobin A1c of 6.4.  Disposition:   FU with me in 2 months  Signed,  Kathlyn Sacramento, MD  10/29/2020 8:37 AM    Gap

## 2020-10-29 NOTE — Patient Instructions (Addendum)
Medication Instructions:  Your physician recommends that you continue on your current medications as directed. Please refer to the Current Medication list given to you today.  *If you need a refill on your cardiac medications before your next appointment, please call your pharmacy*   Lab Work: None ordered If you have labs (blood work) drawn today and your tests are completely normal, you will receive your results only by: Marland Kitchen MyChart Message (if you have MyChart) OR . A paper copy in the mail If you have any lab test that is abnormal or we need to change your treatment, we will call you to review the results.   Testing/Procedures: Your physician has requested that you have an echocardiogram. Echocardiography is a painless test that uses sound waves to create images of your heart. It provides your doctor with information about the size and shape of your heart and how well your heart's chambers and valves are working. This procedure takes approximately one hour. There are no restrictions for this procedure.  Your physician has requested that you have cardiac CT. Cardiac computed tomography (CT) is a painless test that uses an x-ray machine to take clear, detailed pictures of your heart. For further information please visit [email or web address].[email or web address]. Please follow instruction sheet as given.      Follow-Up: At [facility], you and your health needs are our priority.  As part of our continuing mission to provide you with exceptional heart care, we have created designated Provider Care Teams.  These Care Teams include your primary Cardiologist (physician) and Advanced Practice Providers (APPs -  Physician Assistants and Nurse Practitioners) who all work together to provide you with the care you need, when you need it.  We recommend signing up for the patient portal called "MyChart".  Sign up information is provided on this After Visit Summary.  MyChart is used to connect with patients for Virtual  Visits (Telemedicine).  Patients are able to view lab/test results, encounter notes, upcoming appointments, etc.  Non-urgent messages can be sent to your provider as well.   To learn more about what you can do with MyChart, go to [email or web address].    Your next appointment:   4 week(s)  The format for your next appointment:   In Person  Provider:   You may see Kathlyn Sacramento, MD or one of the following Advanced Practice Providers on your designated Care Team:    Murray Hodgkins, NP  Christell Faith, PA-C  Marrianne Mood, PA-C  Cadence Kathlen Mody, [place]  Laurann Montana, NP    Other Instructions  Your cardiac CT will be scheduled at one of the below locations:     [facility] [address] [place], [place] [address] [phone]    If scheduled at [facility], please arrive 15 mins early for check-in and test prep.  Please follow these instructions carefully (unless otherwise directed):    On the Night Before the Test: . Be sure to Drink plenty of water. . Do not consume any caffeinated/decaffeinated beverages or chocolate 12 hours prior to your test. . Do not take any antihistamines 12 hours prior to your test.  On the Day of the Test: . Drink plenty of water until 1 hour prior to the test. . Do not eat any food 4 hours prior to the test. . You may take your regular medications prior to the test.  . Take Carvedilol two hours prior to test. . HOLD Hydrochlorothiazide morning of the test. .  FEMALES- please wear underwire-free bra if available        After the Test: . Drink plenty of water. . After receiving IV contrast, you may experience a mild flushed feeling. This is normal. . On occasion, you may experience a mild rash up to 24 hours after the test. This is not dangerous. If this occurs, you can take Benadryl 25 mg and increase your fluid intake. . If you experience trouble breathing,  this can be serious. If it is severe call 911 IMMEDIATELY. If it is mild, please call our office. . If you take any of these medications: Metformin, Avandament, Glucavance, please do not take 48 hours after completing test unless otherwise instructed.   Once we have confirmed authorization from your insurance company, we will call you to set up a date and time for your test. Based on how quickly your insurance processes prior authorizations requests, please allow up to 4 weeks to be contacted for scheduling your Cardiac CT appointment. Be advised that routine Cardiac CT appointments could be scheduled as many as 8 weeks after your provider has ordered it.  For non-scheduling related questions, please contact the cardiac imaging nurse navigator should you have any questions/concerns: Marchia Bond, Cardiac Imaging Nurse Navigator Gordy Clement, Cardiac Imaging Nurse Navigator [facility] Direct Office Dial: [phone]   For scheduling needs, including cancellations and rescheduling, please call [place], [phone].

## 2020-11-02 NOTE — Addendum Note (Signed)
Addended by: Orland Mustard on: 11/02/2020 09:14 AM   Modules accepted: Orders

## 2020-11-03 ENCOUNTER — Telehealth: Payer: Self-pay | Admitting: *Deleted

## 2020-11-03 ENCOUNTER — Ambulatory Visit
Admission: RE | Admit: 2020-11-03 | Discharge: 2020-11-03 | Disposition: A | Discharge status: Home / Self Care | Payer: Medicare HMO | Source: Ambulatory Visit | Attending: Internal Medicine | Admitting: Internal Medicine

## 2020-11-03 ENCOUNTER — Other Ambulatory Visit: Payer: Self-pay | Admitting: Internal Medicine

## 2020-11-03 ENCOUNTER — Other Ambulatory Visit: Payer: Self-pay

## 2020-11-03 DIAGNOSIS — R140 Abdominal distension (gaseous): Secondary | ICD-10-CM | POA: Diagnosis not present

## 2020-11-03 DIAGNOSIS — K429 Umbilical hernia without obstruction or gangrene: Secondary | ICD-10-CM

## 2020-11-03 NOTE — Telephone Encounter (Signed)
Left message to call office

## 2020-11-03 NOTE — Telephone Encounter (Signed)
-----   Message from Delorise Jackson, MD sent at 11/03/2020  9:46 AM EDT ----- Ct scan recommended for hernia further evaluation

## 2020-11-03 NOTE — Addendum Note (Signed)
Addended by: Orland Mustard on: 11/03/2020 09:49 AM   Modules accepted: Orders

## 2020-11-05 ENCOUNTER — Encounter: Payer: Self-pay | Admitting: Podiatry

## 2020-11-05 ENCOUNTER — Ambulatory Visit: Payer: Medicare HMO | Admitting: Podiatry

## 2020-11-05 ENCOUNTER — Other Ambulatory Visit: Payer: Self-pay

## 2020-11-05 DIAGNOSIS — B351 Tinea unguium: Secondary | ICD-10-CM

## 2020-11-05 DIAGNOSIS — M79674 Pain in right toe(s): Secondary | ICD-10-CM

## 2020-11-05 DIAGNOSIS — M79675 Pain in left toe(s): Secondary | ICD-10-CM

## 2020-11-05 NOTE — Progress Notes (Signed)
   SUBJECTIVE Patient with a history of diabetes mellitus presents to office today complaining of elongated, thickened nails that cause pain while ambulating in shoes.  She is unable to trim her own nails. Patient is here for further evaluation and treatment.   Past Medical History:  Diagnosis Date  . Arthritis   . Diabetes ([place]) 02/19/2015  . Diverticulosis 12/07/2016   Sigmoid Colon  . Diverticulosis of sigmoid colon 12/08/2016  . Elevated blood sugar 01/15/2015  . GERD (gastroesophageal reflux disease)   . Hemorrhoids   . History of blood transfusion   . Hyperlipidemia   . Hypertension 01/15/2015  . Obesity 01/15/2015  . OSA on CPAP     OBJECTIVE General Patient is awake, alert, and oriented x 3 and in no acute distress. Derm Skin is dry and supple bilateral. Negative open lesions or macerations. Remaining integument unremarkable. Nails are tender, long, thickened and dystrophic with subungual debris, consistent with onychomycosis, 1-5 bilateral. No signs of infection noted. Vasc  DP and PT pedal pulses palpable bilaterally. Temperature gradient within normal limits.  Neuro Epicritic and protective threshold sensation diminished bilaterally.  Musculoskeletal Exam No symptomatic pedal deformities noted bilateral. Muscular strength within normal limits.  ASSESSMENT 1. Diabetes Mellitus w/ peripheral neuropathy 2. Onychomycosis of nail due to dermatophyte bilateral 3. Pain in foot bilateral  PLAN OF CARE 1. Patient evaluated today. 2. Instructed to maintain good pedal hygiene and foot care. Stressed importance of controlling blood sugar.  3. Mechanical debridement of nails 1-5 bilaterally performed using a nail nipper. Filed with dremel without incident.  4. Return to clinic in 3 mos.     Edrick Kins, DPM [facility]  Dr. Edrick Kins, DPM    [address], [place] [address]                Office [phone]   Fax [phone]

## 2020-11-17 ENCOUNTER — Telehealth: Payer: Self-pay | Admitting: Cardiovascular Disease

## 2020-11-17 ENCOUNTER — Telehealth (HOSPITAL_COMMUNITY): Payer: Self-pay | Admitting: Emergency Medicine

## 2020-11-17 NOTE — Telephone Encounter (Signed)
Patient daughter calling  Patient having Cardiac CT tomorrow Needs prescription for metoprolol sent into [facility] in [place] to take prior to procedure

## 2020-11-17 NOTE — Telephone Encounter (Signed)
Daughter returning phone call regarding upcoming cardiac imaging study; pt verbalizes understanding of appt date/time, parking situation and where to check in, pre-test NPO status and medications ordered, and verified current allergies; name and call back number provided for further questions should they arise Marchia Bond RN Navigator Cardiac Imaging [place] [facility] [phone] office [phone] cell    Daughter understands that the patient does not require additional beta blocker for HR as her HR ranges from 50-65bpm. No additional questions Clarise Cruz

## 2020-11-17 NOTE — Telephone Encounter (Signed)
Reviewed the patient's chart.  Marchia Bond, RN nurse navigator for the Cardiac CT program called the patient's daughter (@ 9:37 am) after this call came in.  Per Sara's note: Note Daughter returning phone call regarding upcoming cardiac imaging study; pt verbalizes understanding of appt date/time, parking situation and where to check in, pre-test NPO status and medications ordered, and verified current allergies; name and call back number provided for further questions should they arise Marchia Bond RN Navigator Cardiac Imaging [facility] [phone] office [phone] cell       Daughter understands that the patient does not require additional beta blocker for HR as her HR ranges from 50-65bpm. No additional questions Clarise Cruz       Will close this encounter.

## 2020-11-17 NOTE — Telephone Encounter (Signed)
Attempted to call patient regarding upcoming cardiac CT appointment. Left message on voicemail with name and callback number Marchia Bond RN Navigator Cardiac Imaging [facility] [phone] Office [phone] Cell  Pt does not need additional beta blocker for HR control, HR 53 during last office visit.  Taylor Shaw

## 2020-11-18 ENCOUNTER — Other Ambulatory Visit: Payer: Self-pay

## 2020-11-18 ENCOUNTER — Ambulatory Visit
Admission: RE | Admit: 2020-11-18 | Discharge: 2020-11-18 | Disposition: A | Discharge status: Home / Self Care | Payer: Medicare HMO | Source: Ambulatory Visit | Attending: Internal Medicine | Admitting: Internal Medicine

## 2020-11-18 ENCOUNTER — Ambulatory Visit
Admission: RE | Admit: 2020-11-18 | Discharge: 2020-11-18 | Disposition: A | Discharge status: Home / Self Care | Payer: Medicare HMO | Source: Ambulatory Visit | Attending: Cardiovascular Disease | Admitting: Cardiovascular Disease

## 2020-11-18 DIAGNOSIS — K429 Umbilical hernia without obstruction or gangrene: Secondary | ICD-10-CM

## 2020-11-18 DIAGNOSIS — I209 Angina pectoris, unspecified: Secondary | ICD-10-CM

## 2020-11-18 MED ORDER — METOPROLOL TARTRATE 5 MG/5ML IV SOLN
10.0000 mg | Freq: Once | INTRAVENOUS | Status: AC
  Administered 2020-11-18: 10 mg via INTRAVENOUS

## 2020-11-18 MED ORDER — NITROGLYCERIN 0.4 MG SL SUBL
0.8000 mg | SUBLINGUAL_TABLET | Freq: Once | SUBLINGUAL | Status: AC
  Administered 2020-11-18: 0.8 mg via SUBLINGUAL

## 2020-11-18 MED ORDER — IOHEXOL 350 MG/ML SOLN
125.0000 mL | Freq: Once | INTRAVENOUS | Status: AC | PRN
  Administered 2020-11-18: 125 mL via INTRAVENOUS

## 2020-11-18 NOTE — Progress Notes (Signed)
Patient tolerated CT well. Drank water after and gave a bottle of water to take with patient to drink. Vital signs stable encourage to drink water throughout day.Reasons explained and verbalized understanding. Ambulated steady gait.

## 2020-11-23 ENCOUNTER — Other Ambulatory Visit: Payer: Self-pay

## 2020-11-23 ENCOUNTER — Ambulatory Visit
Admission: RE | Admit: 2020-11-23 | Discharge: 2020-11-23 | Disposition: A | Discharge status: Home / Self Care | Payer: Medicare HMO | Source: Ambulatory Visit | Attending: Cardiovascular Disease | Admitting: Cardiovascular Disease

## 2020-11-23 DIAGNOSIS — E118 Type 2 diabetes mellitus with unspecified complications: Secondary | ICD-10-CM | POA: Diagnosis not present

## 2020-11-23 DIAGNOSIS — R0602 Shortness of breath: Secondary | ICD-10-CM | POA: Diagnosis not present

## 2020-11-23 DIAGNOSIS — I10 Essential (primary) hypertension: Secondary | ICD-10-CM | POA: Insufficient documentation

## 2020-11-23 DIAGNOSIS — I517 Cardiomegaly: Secondary | ICD-10-CM | POA: Insufficient documentation

## 2020-11-23 DIAGNOSIS — R0600 Dyspnea, unspecified: Secondary | ICD-10-CM | POA: Insufficient documentation

## 2020-11-23 DIAGNOSIS — E785 Hyperlipidemia, unspecified: Secondary | ICD-10-CM | POA: Insufficient documentation

## 2020-11-23 LAB — ECHOCARDIOGRAM COMPLETE
AR max vel: 3.46 cm2
AV Area VTI: 3.58 cm2
AV Area mean vel: 3.57 cm2
AV Mean grad: 7 mmHg
AV Peak grad: 13 mmHg
Ao pk vel: 1.8 m/s
Area-P 1/2: 2.24 cm2
MV VTI: 3.34 cm2
S' Lateral: 3.1 cm

## 2020-11-23 NOTE — Progress Notes (Signed)
*  PRELIMINARY RESULTS* Echocardiogram 2D Echocardiogram has been performed.  Taylor Shaw 11/23/2020, 10:36 AM

## 2020-11-25 NOTE — Progress Notes (Deleted)
Cardiology Office Note    Date:  11/25/2020   ID:  Taylor Shaw, DOB 07-Apr-1941, MRN [id number]  PCP:  McLean-Scocuzza, Nino Glow, MD  Cardiologist:  Kathlyn Sacramento, MD  Electrophysiologist:  None   Chief Complaint: Follow-up  History of Present Illness:   Taylor Shaw is a 80 y.o. female with history of mild nonobstructive CAD by noninvasive imaging, DM2, HTN, HLD, obesity, OSA on CPAP, and GERD who presents for follow-up of coronary CTA and echo.  She was evaluated as a new patient by Dr. Fletcher Anon, at the request of her PCP for HTN, exertional SOB, lower extremity swelling, and fatigue on 10/29/2020.  At that time, the patient reported progressive symptoms of exertional dyspnea that were occurring with minimal exertion without chest discomfort.  She did not have a regular exercise regimen.  She indicated she had previously been told she occasionally wheezes, though did not have history of lung disease.  Given her symptoms she underwent coronary CTA on 11/18/2020 which showed a calcium score of 629 which was the 92nd percentile, as well as mild nonobstructive CAD including calcified plaque in the proximal LAD causing mild stenosis, and minimal distal RCA stenosis estimated at less than 25%.  Echo on 11/23/2020 showed an EF of 60 to 65%, moderate LVH, grade 1 diastolic dysfunction, normal RV systolic function and ventricular cavity size, mildly dilated left atrium, and trivial mitral regurgitation with moderate mitral annular calcification.  ***   Labs independently reviewed: 10/2020 - TSH normal, A1c 6.4, TC 137, TG 74, HDL 55, LDL 67, Hgb 12.2, PLT 236, potassium 3.7, BUN 17, serum creatinine 0.88, albumin 4.0, AST/ALT normal  Past Medical History:  Diagnosis Date   Arthritis    Diabetes ([place]) 02/19/2015   Diverticulosis 12/07/2016   Sigmoid Colon   Diverticulosis of sigmoid colon 12/08/2016   Elevated blood sugar 01/15/2015   GERD (gastroesophageal reflux disease)    Hemorrhoids     History of blood transfusion    Hyperlipidemia    Hypertension 01/15/2015   Obesity 01/15/2015   OSA on CPAP     Past Surgical History:  Procedure Laterality Date   BOTOX INJECTION N/A 03/27/2018   Procedure: BOTOX INJECTION;  Surgeon: Jules Husbands, MD;  Location: [facility];  Service: General;  Laterality: N/A;   CATARACT EXTRACTION W/PHACO Left 05/31/2020   Procedure: CATARACT EXTRACTION PHACO AND INTRAOCULAR LENS PLACEMENT ([place]) LEFT;  Surgeon: Eulogio Bear, MD;  Location: [place];  Service: Ophthalmology;  Laterality: Left;  4.16 0:40.4   CATARACT EXTRACTION W/PHACO Right 07/05/2020   Procedure: CATARACT EXTRACTION PHACO AND INTRAOCULAR LENS PLACEMENT ([facility]) RIGHT DIABETIC 3.20 00:26.3;  Surgeon: Eulogio Bear, MD;  Location: [place];  Service: Ophthalmology;  Laterality: Right;  Diabetic - oral meds   COLONOSCOPY     COLONOSCOPY WITH PROPOFOL N/A 12/07/2016   Procedure: COLONOSCOPY WITH PROPOFOL;  Surgeon: [name], MD;  Location: [place];  Service: Endoscopy;  Laterality: N/A;  Diabetic - oral meds   EVALUATION UNDER ANESTHESIA WITH ANAL FISSUROTOMY N/A 03/27/2018   Procedure: EXAM UNDER ANESTHESIA, CHEMICAL SPHINCTEROTOMY;  Surgeon: Jules Husbands, MD;  Location: [facility];  Service: General;  Laterality: N/A;   HEMORRHOID SURGERY N/A 06/13/2018   Procedure: OVFIEPPIRJJOACZY;  Surgeon: Jules Husbands, MD;  Location: [facility];  Service: General;  Laterality: N/A;   JOINT REPLACEMENT     b/l hips in [place] 2004 and 2005 Dr. Alfonso Ramus    POLYPECTOMY  12/07/2016   Procedure: POLYPECTOMY;  Surgeon: [name], MD;  Location: [place];  Service: Endoscopy;;   RECTAL EXAM UNDER ANESTHESIA N/A 06/13/2018   Procedure: RECTAL EXAM UNDER ANESTHESIA;  Surgeon: Jules Husbands, MD;  Location: [facility];  Service: General;  Laterality: N/A;   TOTAL HIP ARTHROPLASTY Bilateral    TUBAL LIGATION      Current Medications: No outpatient  medications have been marked as taking for the 11/26/20 encounter (Appointment) with Rise Mu, PA-C.    Allergies:   Patient has no known allergies.   Social History   Socioeconomic History   Marital status: Divorced    Spouse name: Not on file   Number of children: Not on file   Years of education: Not on file   Highest education level: Not on file  Occupational History   Occupation: Engineer, maintenance furniture covers    Comment: retired  Tobacco Use   Smoking status: Never   Smokeless tobacco: Never  Vaping Use   Vaping Use: Never used  Substance and Sexual Activity   Alcohol use: No    Alcohol/week: 0.0 standard drinks   Drug use: No   Sexual activity: Not Currently  Other Topics Concern   Not on file  Social History Narrative   Lives with daughter [name] to go to church    Moved from [place] 3-4 years ago    No guns, wears seat belt    Never smoker    Social Determinants of Radio broadcast assistant Strain: Low Risk    Difficulty of Paying Living Expenses: Not hard at all  Food Insecurity: No Food Insecurity   Worried About Charity fundraiser in the Last Year: Never true   Arboriculturist in the Last Year: Never true  Transportation Needs: No Transportation Needs   Lack of Transportation (Medical): No   Lack of Transportation (Non-Medical): No  Physical Activity: Not on file  Stress: No Stress Concern Present   Feeling of Stress : Not at all  Social Connections: Unknown   Frequency of Communication with Friends and Family: More than three times a week   Frequency of Social Gatherings with Friends and Family: Not on file   Attends Religious Services: Not on file   Active Member of Clubs or Organizations: Not on file   Attends Archivist Meetings: Not on file   Marital Status: Not on file     Family History:  The patient's family history includes Alcohol abuse in her father; Alzheimer's disease in her sister;  Arthritis in her mother; Breast cancer (age of onset: 70) in her sister; COPD in her brother; Cancer in her brother, brother, daughter, daughter, and sister; Cirrhosis in her father; Dementia in her mother and sister; Hypertension in her mother.  ROS:   ROS   EKGs/Labs/Other Studies Reviewed:    Studies reviewed were summarized above. The additional studies were reviewed today:  2D echo 11/23/2020: 1. Left ventricular ejection fraction, by estimation, is 60 to 65%. The  left ventricle has normal function. Left ventricular endocardial border  not optimally defined to evaluate regional wall motion. There is moderate  left ventricular hypertrophy. Left  ventricular diastolic parameters are consistent with Grade I diastolic  dysfunction (impaired relaxation). Elevated left atrial pressure.   2. Right ventricular systolic function is normal. The right ventricular  size is normal. Tricuspid regurgitation signal is inadequate for assessing  PA pressure.  3. Left atrial size was mildly dilated.   4. The mitral valve is abnormal. Trivial mitral valve regurgitation. No  evidence of mitral stenosis. Moderate mitral annular calcification.   5. The aortic valve was not well visualized. Aortic valve regurgitation  is not visualized. No aortic stenosis is present.  Coronary CTA 11/18/2020: FINDINGS: Aorta: Normal size. Aortic root calcifications noted. No dissection.   Aortic Valve:  Trileaflet.  No calcifications.   Mitral valve: Heavy mitral annular calcifications noted.   Coronary Arteries:  Normal coronary origin.  Right dominance.   RCA is a dominant artery that gives rise to PDA and PLA. There is calcified plaque in the distal RCA causing minimal stenosis (<25%).   Left main is a large artery that gives rise to LAD and LCX arteries.   LAD has proximal to mid calcified plaque causing mild stenosis (25-49%).   LCX is a non-dominant artery that gives rise to one obtuse  marginal branch. There is no plaque.   Other findings:   Normal pulmonary vein drainage into the left atrium.   Normal left atrial appendage without a thrombus.   Normal size of the pulmonary artery.   IMPRESSION: 1. Coronary calcium score of 629. This was 92nd percentile for age and sex matched control.   2. Normal coronary origin with right dominance.   3. Calcified plaque in the proximal LAD causing mild stenosis.   4. Minimal (<25%) distal RCA stenosis   5. CAD-RADS 2. Mild non-obstructive CAD (25-49%). Consider non-atherosclerotic causes of chest pain. Consider preventive therapy and risk factor modification.    EKG:  EKG is ordered today.  The EKG ordered today demonstrates ***  Recent Labs: 10/26/2020: ALT 6; BUN 17; Creatinine, Ser 0.88; Hemoglobin 12.2; Platelets 236.0; Potassium 3.7; Sodium 140; TSH 3.02  Recent Lipid Panel    Component Value Date/Time   CHOL 137 10/26/2020 0852   CHOL 103 01/19/2015 0811   TRIG 74.0 10/26/2020 0852   HDL 55.20 10/26/2020 0852   HDL 50 01/19/2015 0811   CHOLHDL 2 10/26/2020 0852   VLDL 14.8 10/26/2020 0852   LDLCALC 67 10/26/2020 0852   LDLCALC 42 01/19/2015 0811    PHYSICAL EXAM:    VS:  There were no vitals taken for this visit.  BMI: There is no height or weight on file to calculate BMI.  Physical Exam  Wt Readings from Last 3 Encounters:  10/29/20 259 lb 2 oz (117.5 kg)  10/26/20 259 lb 3.2 oz (117.6 kg)  07/05/20 251 lb (113.9 kg)     ASSESSMENT & PLAN:   Nonobstructive CAD:  Exertional dyspnea:  HTN: Blood ***  HLD: LDL 67 in 10/2020.  Obesity with physical deconditioning and OSA:  Disposition: F/u with Dr. Fletcher Anon or an APP in ***.   Medication Adjustments/Labs and Tests Ordered: Current medicines are reviewed at length with the patient today.  Concerns regarding medicines are outlined above. Medication changes, Labs and Tests ordered today are summarized above and listed in the Patient  Instructions accessible in Encounters.   Signed, Christell Faith, PA-C 11/25/2020 8:42 AM     [facility] [address] [place], [place] [address] [phone]

## 2020-11-26 ENCOUNTER — Ambulatory Visit: Payer: Medicare HMO | Admitting: Physician Assistant

## 2020-12-02 DIAGNOSIS — G4733 Obstructive sleep apnea (adult) (pediatric): Secondary | ICD-10-CM | POA: Diagnosis not present

## 2020-12-07 ENCOUNTER — Ambulatory Visit: Payer: Medicare HMO | Admitting: Cardiology

## 2021-01-03 ENCOUNTER — Encounter: Payer: Self-pay | Admitting: Physician Assistant

## 2021-01-03 ENCOUNTER — Other Ambulatory Visit: Payer: Self-pay

## 2021-01-03 ENCOUNTER — Telehealth: Payer: Self-pay

## 2021-01-03 ENCOUNTER — Ambulatory Visit (INDEPENDENT_AMBULATORY_CARE_PROVIDER_SITE_OTHER): Payer: Medicare HMO | Admitting: Physician Assistant

## 2021-01-03 VITALS — BP 124/80 | HR 65 | Ht 64.0 in | Wt 253.0 lb

## 2021-01-03 DIAGNOSIS — I10 Essential (primary) hypertension: Secondary | ICD-10-CM

## 2021-01-03 DIAGNOSIS — R0609 Other forms of dyspnea: Secondary | ICD-10-CM

## 2021-01-03 DIAGNOSIS — I2510 Atherosclerotic heart disease of native coronary artery without angina pectoris: Secondary | ICD-10-CM

## 2021-01-03 DIAGNOSIS — Z9989 Dependence on other enabling machines and devices: Secondary | ICD-10-CM

## 2021-01-03 DIAGNOSIS — R0600 Dyspnea, unspecified: Secondary | ICD-10-CM

## 2021-01-03 DIAGNOSIS — G4733 Obstructive sleep apnea (adult) (pediatric): Secondary | ICD-10-CM

## 2021-01-03 DIAGNOSIS — E785 Hyperlipidemia, unspecified: Secondary | ICD-10-CM

## 2021-01-03 DIAGNOSIS — Z6841 Body Mass Index (BMI) 40.0 and over, adult: Secondary | ICD-10-CM

## 2021-01-03 NOTE — Patient Instructions (Signed)
Medication Instructions:  No changes at this time.  *If you need a refill on your cardiac medications before your next appointment, please call your pharmacy*   Lab Work: None  If you have labs (blood work) drawn today and your tests are completely normal, you will receive your results only by: [place] (if you have MyChart) OR A paper copy in the mail If you have any lab test that is abnormal or we need to change your treatment, we will call you to review the results.   Testing/Procedures: None   Follow-Up: At [facility], you and your health needs are our priority.  As part of our continuing mission to provide you with exceptional heart care, we have created designated Provider Care Teams.  These Care Teams include your primary Cardiologist (physician) and Advanced Practice Providers (APPs -  Physician Assistants and Nurse Practitioners) who all work together to provide you with the care you need, when you need it.  We recommend signing up for the patient portal called "MyChart".  Sign up information is provided on this After Visit Summary.  MyChart is used to connect with patients for Virtual Visits (Telemedicine).  Patients are able to view lab/test results, encounter notes, upcoming appointments, etc.  Non-urgent messages can be sent to your provider as well.   To learn more about what you can do with MyChart, go to [email or web address].    Your next appointment:   2 month(s)  The format for your next appointment:   In Person  Provider:   Kathlyn Sacramento, MD or Christell Faith, PA-C

## 2021-01-03 NOTE — Telephone Encounter (Signed)
Spoke to patient and requested that she bring SD card to 01/04/2021 visit.

## 2021-01-03 NOTE — Progress Notes (Signed)
Cardiology Office Note    Date:  01/03/2021   ID:  Taylor Shaw, DOB June 20, 1940, MRN [id number]  PCP:  McLean-Scocuzza, Nino Glow, MD  Cardiologist:  Kathlyn Sacramento, MD  Electrophysiologist:  None   Chief Complaint: Follow-up  History of Present Illness:   Taylor Shaw is a 80 y.o. female with history of nonobstructive CAD, DM2, moderate restrictive lung disease, HTN, HLD, obesity, OSA on CPAP, and GERD who presents for follow-up of recent coronary CTA and echo.  She was seen as a new patient by Dr. Fletcher Anon on 10/29/2020 at the request of her PCP for shortness of breath, lower extremity swelling, fatigue, and hypertension.  At that time, she had no prior known cardiac history.  At her initial visit in 10/2020, she reported progressive symptoms of exertional dyspnea that were occurring with minimal exertion.  She denied any chest discomfort or tightness.  Given symptoms, she underwent coronary CTA on 11/18/2020 with no significant extracardiac findings with prior granulomatous disease noted.  Cardiac findings include a calcium score of 629 which was the 92nd percentile, as well as mild nonobstructive disease involving the proximal to mid LAD estimated at 25 to 49% and less than 25% of distal RCA stenosis.  Medical management and risk factor modification was recommended.  Echo on 11/23/2020 showed an EF of 60 to 65%, moderate LVH, grade 1 diastolic dysfunction, normal RV systolic function and ventricular cavity size, mildly dilated left atrium, trivial mitral regurgitation, and moderate mitral annular calcification.  Prior PFTs in 2020 demonstrated moderate restrictive lung disease without significant bronchodilator response.  She comes in doing well today.  She does indicate she feels better today than she did at her last visit.  When asked about this, she indicates that she does not feel as achy or sore.  Her dyspnea remains unchanged.  She indicates she can [place] medical [place] to three  fourths of the way to our office in the medical arts building before becoming short of breath.  No chest pain, palpitations, dizziness, presyncope, or syncope.  No lower extremity swelling, abdominal distention, orthopnea, PND, or early satiety.  She is tolerating all medications without issues.  When compared to her visit in 10/2020, her weight is down 6 pounds today.  She lives a sedentary lifestyle for the most part.  She will be seeing pulmonology tomorrow for further evaluation of her dyspnea and to establish care for her OSA.   Labs independently reviewed: 10/2020 - TSH normal, A1c 6.4, TC 137, TG 74, HDL 55, LDL 67, Hgb 12.2, PLT 236, potassium 3.7, BUN 17, serum creatinine 0.88, albumin 4.0, AST/ALT normal   Past Medical History:  Diagnosis Date   Arthritis    Diabetes ([place]) 02/19/2015   Diverticulosis 12/07/2016   Sigmoid Colon   Diverticulosis of sigmoid colon 12/08/2016   Elevated blood sugar 01/15/2015   GERD (gastroesophageal reflux disease)    Hemorrhoids    History of blood transfusion    Hyperlipidemia    Hypertension 01/15/2015   Obesity 01/15/2015   OSA on CPAP     Past Surgical History:  Procedure Laterality Date   BOTOX INJECTION N/A 03/27/2018   Procedure: BOTOX INJECTION;  Surgeon: Jules Husbands, MD;  Location: [facility];  Service: General;  Laterality: N/A;   CATARACT EXTRACTION W/PHACO Left 05/31/2020   Procedure: CATARACT EXTRACTION PHACO AND INTRAOCULAR LENS PLACEMENT ([place]) LEFT;  Surgeon: Eulogio Bear, MD;  Location: [facility];  Service: Ophthalmology;  Laterality: Left;  4.16 0:40.4   CATARACT EXTRACTION W/PHACO Right 07/05/2020   Procedure: CATARACT EXTRACTION PHACO AND INTRAOCULAR LENS PLACEMENT ([facility]) RIGHT DIABETIC 3.20 00:26.3;  Surgeon: Eulogio Bear, MD;  Location: [facility];  Service: Ophthalmology;  Laterality: Right;  Diabetic - oral meds   COLONOSCOPY     COLONOSCOPY WITH PROPOFOL N/A 12/07/2016   Procedure: COLONOSCOPY WITH  PROPOFOL;  Surgeon: [name], MD;  Location: [facility];  Service: Endoscopy;  Laterality: N/A;  Diabetic - oral meds   EVALUATION UNDER ANESTHESIA WITH ANAL FISSUROTOMY N/A 03/27/2018   Procedure: EXAM UNDER ANESTHESIA, CHEMICAL SPHINCTEROTOMY;  Surgeon: Jules Husbands, MD;  Location: [facility];  Service: General;  Laterality: N/A;   HEMORRHOID SURGERY N/A 06/13/2018   Procedure: HEMORRHOIDECTOMY;  Surgeon: Jules Husbands, MD;  Location: [facility];  Service: General;  Laterality: N/A;   JOINT REPLACEMENT     b/l hips in [place] 2004 and 2005 Dr. Alfonso Ramus    POLYPECTOMY  12/07/2016   Procedure: POLYPECTOMY;  Surgeon: [name], MD;  Location: [facility];  Service: Endoscopy;;   RECTAL EXAM UNDER ANESTHESIA N/A 06/13/2018   Procedure: RECTAL EXAM UNDER ANESTHESIA;  Surgeon: Jules Husbands, MD;  Location: [facility];  Service: General;  Laterality: N/A;   TOTAL HIP ARTHROPLASTY Bilateral    TUBAL LIGATION      Current Medications: Current Meds  Medication Sig   amLODipine (NORVASC) 10 MG tablet Take 1 tablet (10 mg total) by mouth daily.   atorvastatin (LIPITOR) 10 MG tablet Take 1 tablet (10 mg total) by mouth daily at 6 PM.   Blood Glucose Calibration (TRUE METRIX LEVEL 2) Normal SOLN    blood glucose meter kit and supplies Dispense based on patient and insurance preference. Use up to four times daily as directed. (FOR ICD-10 E10.9, E11.9).   Calcium Carb-Cholecalciferol 600-800 MG-UNIT TABS Take 1 tablet by mouth 2 (two) times daily.   carvedilol (COREG) 25 MG tablet Take 1 tablet (25 mg total) by mouth 2 (two) times daily with a meal.   docusate sodium (COLACE) 100 MG capsule Take 100 mg by mouth 2 (two) times daily as needed.   hydrALAZINE (APRESOLINE) 10 MG tablet Take 1 tablet (10 mg total) by mouth in the morning and at bedtime.   levothyroxine (SYNTHROID) 75 MCG tablet Take 1 tablet (75 mcg total) by mouth daily before breakfast. 30 minutes before food    losartan-hydrochlorothiazide (HYZAAR) 100-25 MG tablet Take 1 tablet by mouth daily. In am   metFORMIN (GLUCOPHAGE) 500 MG tablet 1 pill daily in the am with food   Omega-3 Fatty Acids (FISH OIL) 1000 MG CAPS Take 1,000 mg by mouth 2 (two) times daily.    omeprazole (PRILOSEC) 20 MG capsule Take 1 capsule (20 mg total) by mouth daily. 30 min before food   TRUE METRIX BLOOD GLUCOSE TEST test strip    TRUEplus Lancets 33G MISC     Allergies:   Patient has no known allergies.   Social History   Socioeconomic History   Marital status: Divorced    Spouse name: Not on file   Number of children: Not on file   Years of education: Not on file   Highest education level: Not on file  Occupational History   Occupation: Engineer, maintenance furniture covers    Comment: retired  Tobacco Use   Smoking status: Never   Smokeless tobacco: Never  Vaping Use   Vaping Use: Never used  Substance and  Sexual Activity   Alcohol use: No    Alcohol/week: 0.0 standard drinks   Drug use: No   Sexual activity: Not Currently  Other Topics Concern   Not on file  Social History Narrative   Lives with daughter [name] to go to church    Moved from [place] 3-4 years ago    No guns, wears seat belt    Never smoker    Social Determinants of Radio broadcast assistant Strain: Low Risk    Difficulty of Paying Living Expenses: Not hard at all  Food Insecurity: No Food Insecurity   Worried About Charity fundraiser in the Last Year: Never true   Arboriculturist in the Last Year: Never true  Transportation Needs: No Transportation Needs   Lack of Transportation (Medical): No   Lack of Transportation (Non-Medical): No  Physical Activity: Not on file  Stress: No Stress Concern Present   Feeling of Stress : Not at all  Social Connections: Unknown   Frequency of Communication with Friends and Family: More than three times a week   Frequency of Social Gatherings with Friends and Family:  Not on file   Attends Religious Services: Not on file   Active Member of Clubs or Organizations: Not on file   Attends Archivist Meetings: Not on file   Marital Status: Not on file     Family History:  The patient's family history includes Alcohol abuse in her father; Alzheimer's disease in her sister; Arthritis in her mother; Breast cancer (age of onset: 82) in her sister; COPD in her brother; Cancer in her brother, brother, daughter, daughter, and sister; Cirrhosis in her father; Dementia in her mother and sister; Hypertension in her mother.  ROS:   Review of Systems  Constitutional:  Positive for malaise/fatigue. Negative for chills, diaphoresis, fever and weight loss.  HENT:  Negative for congestion.   Eyes:  Negative for discharge and redness.  Respiratory:  Positive for shortness of breath. Negative for cough, sputum production and wheezing.   Cardiovascular:  Negative for chest pain, palpitations, orthopnea, claudication, leg swelling and PND.  Gastrointestinal:  Negative for abdominal pain, heartburn, nausea and vomiting.  Musculoskeletal:  Negative for falls and myalgias.  Skin:  Negative for rash.  Neurological:  Negative for dizziness, tingling, tremors, sensory change, speech change, focal weakness, loss of consciousness and weakness.  Endo/Heme/Allergies:  Does not bruise/bleed easily.  Psychiatric/Behavioral:  Negative for substance abuse. The patient is not nervous/anxious.   All other systems reviewed and are negative.   EKGs/Labs/Other Studies Reviewed:    Studies reviewed were summarized above. The additional studies were reviewed today:  2D echo 11/23/2020: 1. Left ventricular ejection fraction, by estimation, is 60 to 65%. The  left ventricle has normal function. Left ventricular endocardial border  not optimally defined to evaluate regional wall motion. There is moderate  left ventricular hypertrophy. Left  ventricular diastolic parameters are  consistent with Grade I diastolic  dysfunction (impaired relaxation). Elevated left atrial pressure.   2. Right ventricular systolic function is normal. The right ventricular  size is normal. Tricuspid regurgitation signal is inadequate for assessing  PA pressure.   3. Left atrial size was mildly dilated.   4. The mitral valve is abnormal. Trivial mitral valve regurgitation. No  evidence of mitral stenosis. Moderate mitral annular calcification.   5. The aortic valve was not well visualized. Aortic valve regurgitation  is not visualized. No aortic  stenosis is present.  __________  Coronary CTA 11/18/2020: FINDINGS: Aorta: Normal size. Aortic root calcifications noted. No dissection.   Aortic Valve:  Trileaflet.  No calcifications.   Mitral valve: Heavy mitral annular calcifications noted.   Coronary Arteries:  Normal coronary origin.  Right dominance.   RCA is a dominant artery that gives rise to PDA and PLA. There is calcified plaque in the distal RCA causing minimal stenosis (<25%).   Left main is a large artery that gives rise to LAD and LCX arteries.   LAD has proximal to mid calcified plaque causing mild stenosis (25-49%).   LCX is a non-dominant artery that gives rise to one obtuse marginal branch. There is no plaque.   Other findings:   Normal pulmonary vein drainage into the left atrium.   Normal left atrial appendage without a thrombus.   Normal size of the pulmonary artery.   IMPRESSION: 1. Coronary calcium score of 629. This was 92nd percentile for age and sex matched control.   2. Normal coronary origin with right dominance.   3. Calcified plaque in the proximal LAD causing mild stenosis.   4. Minimal (<25%) distal RCA stenosis   5. CAD-RADS 2. Mild non-obstructive CAD (25-49%). Consider non-atherosclerotic causes of chest pain. Consider preventive therapy and risk factor modification.    EKG:  EKG is not ordered today.    Recent Labs: 10/26/2020:  ALT 6; BUN 17; Creatinine, Ser 0.88; Hemoglobin 12.2; Platelets 236.0; Potassium 3.7; Sodium 140; TSH 3.02  Recent Lipid Panel    Component Value Date/Time   CHOL 137 10/26/2020 0852   CHOL 103 01/19/2015 0811   TRIG 74.0 10/26/2020 0852   HDL 55.20 10/26/2020 0852   HDL 50 01/19/2015 0811   CHOLHDL 2 10/26/2020 0852   VLDL 14.8 10/26/2020 0852   LDLCALC 67 10/26/2020 0852   LDLCALC 42 01/19/2015 0811    PHYSICAL EXAM:    VS:  BP 124/80 (BP Location: Left Arm, Patient Position: Sitting, Cuff Size: Large)   Pulse 65   Ht _0  (1.626 m)   Wt 253 lb (114.8 kg)   SpO2 95%   BMI 43.43 kg/m   BMI: Body mass index is 43.43 kg/m.  Physical Exam Vitals reviewed.  Constitutional:      Appearance: She is well-developed.  HENT:     Head: Normocephalic and atraumatic.  Eyes:     General:        Right eye: No discharge.        Left eye: No discharge.  Neck:     Vascular: No JVD.  Cardiovascular:     Rate and Rhythm: Normal rate and regular rhythm.     Pulses:          Posterior tibial pulses are 2+ on the right side and 2+ on the left side.     Heart sounds: Normal heart sounds, S1 normal and S2 normal. Heart sounds not distant. No midsystolic click and no opening snap. No murmur heard.   No friction rub.  Pulmonary:     Effort: Pulmonary effort is normal. No respiratory distress.     Breath sounds: Normal breath sounds. No decreased breath sounds, wheezing or rales.  Chest:     Chest wall: No tenderness.  Abdominal:     General: There is no distension.     Palpations: Abdomen is soft.     Tenderness: There is no abdominal tenderness.  Musculoskeletal:     Cervical back: Normal range of motion.  Right lower leg: No edema.     Left lower leg: No edema.  Skin:    General: Skin is warm and dry.     Nails: There is no clubbing.  Neurological:     Mental Status: She is alert and oriented to person, place, and time.  Psychiatric:        Speech: Speech normal.         Behavior: Behavior normal.        Thought Content: Thought content normal.        Judgment: Judgment normal.    Wt Readings from Last 3 Encounters:  01/03/21 253 lb (114.8 kg)  10/29/20 259 lb 2 oz (117.5 kg)  10/26/20 259 lb 3.2 oz (117.6 kg)     ASSESSMENT & PLAN:   Nonobstructive CAD: No symptoms of angina.  Cardiac work-up has revealed nonobstructive disease.  Continue risk factor modification and medical therapy including amlodipine, atorvastatin, and carvedilol.  Add ASA 81 mg daily.  No plans for further ischemic evaluation at this time.  Exertional dyspnea: Cardiac work-up has been largely unrevealing as outlined above.  Prior PFTs in 2020 demonstrated moderate restrictive lung disease.  She will be reevaluated by pulmonology tomorrow.  Cannot exclude morbid obesity and physical deconditioning contributing to symptoms of dyspnea.  HTN: Blood pressure is well controlled in the office today.  Continue current medical therapy.  HLD: LDL 67 in 10/2020.  She remains on atorvastatin.  Followed by PCP.  DM2: A1c 6.4 in 10/2020.  Follow-up with PCP as directed.  Obesity with OSA: Continued weight loss is encouraged through heart healthy diet and regular exercise.  She remains compliant with CPAP.  Disposition: F/u with Dr. Fletcher Anon or an APP in 2 months.   Medication Adjustments/Labs and Tests Ordered: Current medicines are reviewed at length with the patient today.  Concerns regarding medicines are outlined above. Medication changes, Labs and Tests ordered today are summarized above and listed in the Patient Instructions accessible in Encounters.   Signed, Christell Faith, PA-C 01/03/2021 2:05 PM     [address] [place], [place] [address] [phone]

## 2021-01-04 ENCOUNTER — Encounter: Payer: Self-pay | Admitting: Internal Medicine

## 2021-01-04 ENCOUNTER — Ambulatory Visit (INDEPENDENT_AMBULATORY_CARE_PROVIDER_SITE_OTHER): Payer: Medicare HMO | Admitting: Internal Medicine

## 2021-01-04 VITALS — BP 130/70 | HR 68 | Temp 98.3°F | Ht 63.5 in | Wt 253.0 lb

## 2021-01-04 DIAGNOSIS — G4733 Obstructive sleep apnea (adult) (pediatric): Secondary | ICD-10-CM | POA: Diagnosis not present

## 2021-01-04 DIAGNOSIS — Z9989 Dependence on other enabling machines and devices: Secondary | ICD-10-CM

## 2021-01-04 NOTE — Progress Notes (Signed)
History of Present Illness Taylor Shaw is a 80 y.o. female with OSA on CPAP. She is followed by Dr. Mortimer Shaw.   She had a sleep test repeated for a new cpap device as her current CPAP machine is so old. Home Sleep Test confirmed that she has mild OSA with an AHI of 12, and nocturnal desaturations to 68% when she is not using her CPAP.    01/2020 Pt. Presents for follow up of OSA on CPAP. She has excellent  compliant with CPAP.   Test Results: CPAP Down Load 11/10/2019-12/09/2019>> Raw data personally reviewed S 9 Elite, set pressure of 16 cm H2O AHI is 0.3 Usage 29/30 days( 97%) > 4 hours 29 days ( 97%) Average usage 8 hours 38 minutes.   CPAP download data 01/10/2018-04/09/2018.>>  Raw data personally reviewed , down Load graphs reviewed. Uses greater than 4 hours is 86/90 days.  Average usage on days used 7 hours 20 minutes.  Set pressure is 16 CPAP.  Leaks are minimal.  Residual AHI 0.6.  Overall this shows very good compliance with CPAP with excellent control of obstructive sleep apnea. **CPAP download 09/11/2018- 12/09/2018>> raw data personally reviewed.  Usage greater than 4 hours 85/90 days.  Average usage on days used is 8 hours 31 minutes.  Set pressure 16, leaks are within normal limits, residual AHI 0.4.  Overall this shows very good compliance with CPAP with excellent control obstructive sleep apnea.  CPAP DL 06/2019 AHI reduced to  0.9 CPAP 16 cm h20 Full face maks  **Baseline PSG 12/14/2010: Severe obstructive sleep apnea with AHI of 39.  CPAP titration was recommended   CC Follow up OSA   HPI Patient doing well with CPAP Fullface mask No evidence of heart failure at this time  Patient with excellent compliance report with therapy for sleep apnea   No exacerbation at this time No evidence of heart failure at this time No evidence or signs of infection at this time No respiratory distress No fevers, chills, nausea, vomiting, diarrhea No evidence of lower extremity  edema No evidence hemoptysis   Encouraged proper weight management.  Important to get eight or more hours of sleep  Limiting the use of the computer and television before bedtime.  Decrease naps during the day, so night time sleep will become enhanced.  Limit caffeine, and sleep deprivation.  HTN, stroke, uncontrolled diabetes and heart failure are potential risk factors.  Risk of untreated sleep apnea including cardiac arrhthymias, stroke, DM, pulm HTN.   CBC Latest Ref Rng & Units 10/26/2020 02/05/2020 08/04/2019  WBC 4.0 - 10.5 K/uL 8.6 9.1 7.4  Hemoglobin 12.0 - 15.0 g/dL 12.2 12.1 11.5(L)  Hematocrit 36.0 - 46.0 % 37.2 37.8 35.5(L)  Platelets 150.0 - 400.0 K/uL 236.0 234.0 208.0    BMP Latest Ref Rng & Units 10/26/2020 05/27/2020 02/05/2020  Glucose 70 - 99 mg/dL 125(H) 108(H) 104(H)  BUN 6 - 23 mg/dL 17 11 12   Creatinine 0.40 - 1.20 mg/dL 0.88 0.80 0.80  BUN/Creat Ratio 11 - 26 - - -  Sodium 135 - 145 mEq/L 140 138 139  Potassium 3.5 - 5.1 mEq/L 3.7 3.8 3.8  Chloride 96 - 112 mEq/L 100 98 99  CO2 19 - 32 mEq/L 32 34(H) 33(H)  Calcium 8.4 - 10.5 mg/dL 9.3 9.3 9.5     Past medical hx Past Medical History:  Diagnosis Date   Arthritis    Diabetes ([place]) 02/19/2015   Diverticulosis 12/07/2016   Sigmoid  Colon   Diverticulosis of sigmoid colon 12/08/2016   Elevated blood sugar 01/15/2015   GERD (gastroesophageal reflux disease)    Hemorrhoids    History of blood transfusion    Hyperlipidemia    Hypertension 01/15/2015   Obesity 01/15/2015   OSA on CPAP      Social History   Tobacco Use   Smoking status: Never   Smokeless tobacco: Never   Tobacco comments:    Never   Vaping Use   Vaping Use: Never used  Substance Use Topics   Alcohol use: No    Alcohol/week: 0.0 standard drinks   Drug use: No    Taylor Shaw reports that she has never smoked. She has never used smokeless tobacco. She reports that she does not drink alcohol and does not use drugs.  Tobacco  Cessation: Never smoker  Past surgical hx, Family hx, Social hx all reviewed.  Current Outpatient Medications on File Prior to Visit  Medication Sig   amLODipine (NORVASC) 10 MG tablet Take 1 tablet (10 mg total) by mouth daily.   atorvastatin (LIPITOR) 10 MG tablet Take 1 tablet (10 mg total) by mouth daily at 6 PM.   Blood Glucose Calibration (TRUE METRIX LEVEL 2) Normal SOLN    blood glucose meter kit and supplies Dispense based on patient and insurance preference. Use up to four times daily as directed. (FOR ICD-10 E10.9, E11.9).   Calcium Carb-Cholecalciferol 600-800 MG-UNIT TABS Take 1 tablet by mouth 2 (two) times daily.   carvedilol (COREG) 25 MG tablet Take 1 tablet (25 mg total) by mouth 2 (two) times daily with a meal.   docusate sodium (COLACE) 100 MG capsule Take 100 mg by mouth 2 (two) times daily as needed.   hydrALAZINE (APRESOLINE) 10 MG tablet Take 1 tablet (10 mg total) by mouth in the morning and at bedtime.   levothyroxine (SYNTHROID) 75 MCG tablet Take 1 tablet (75 mcg total) by mouth daily before breakfast. 30 minutes before food   losartan-hydrochlorothiazide (HYZAAR) 100-25 MG tablet Take 1 tablet by mouth daily. In am   metFORMIN (GLUCOPHAGE) 500 MG tablet 1 pill daily in the am with food   Omega-3 Fatty Acids (FISH OIL) 1000 MG CAPS Take 1,000 mg by mouth 2 (two) times daily.    omeprazole (PRILOSEC) 20 MG capsule Take 1 capsule (20 mg total) by mouth daily. 30 min before food   TRUE METRIX BLOOD GLUCOSE TEST test strip    TRUEplus Lancets 33G MISC    No current facility-administered medications on file prior to visit.     No Known Allergies   Review of Systems:  Gen:  Denies  fever, sweats, chills weight loss  HEENT: Denies blurred vision, double vision, ear pain, eye pain, hearing loss, nose bleeds, sore throat Cardiac:  No dizziness, chest pain or heaviness, chest tightness,edema, No JVD Resp:   No cough, -sputum production, +shortness of  breath,-wheezing, -hemoptysis,  Gi: Denies swallowing difficulty, stomach pain, nausea or vomiting, diarrhea, constipation, bowel incontinence Gu:  Denies bladder incontinence, burning urine Ext:   Denies Joint pain, stiffness or swelling Skin: Denies  skin rash, easy bruising or bleeding or hives Endoc:  Denies polyuria, polydipsia , polyphagia or weight change Psych:   Denies depression, insomnia or hallucinations  Other:  All other systems negative   Vital Signs BP 130/70 (BP Location: Left Arm, Patient Position: Sitting, Cuff Size: Normal)   Pulse 68   Temp 98.3 F (36.8 C) (Oral)   Ht 5' 3.5" (  1.613 m)   Wt 253 lb (114.8 kg)   SpO2 98%   BMI 44.11 kg/m    Physical Examination:   General Appearance: No distress  EYES PERRLA, EOM intact.   NECK Supple, No JVD Pulmonary: normal breath sounds, No wheezing.  CardiovascularNormal S1,S2.  No m/r/g.   Abdomen: Benign, Soft, non-tender. Skin:   warm, no rashes, no ecchymosis  Extremities: normal, no cyanosis, clubbing. Neuro:without focal findings,  speech normal  PSYCHIATRIC: Mood, affect within normal limits.   ALL OTHER ROS ARE NEGATIVE    Assessment/Plan 80 year old pleasant African-American female seen today for the first time by me for follow-up obstructive sleep apnea initially diagnosed in 2012 and revisited in 2019 Patient with moderate to severe sleep apnea based on previous diagnostic testing Compliance report reviewed from January 2021 Patient with excellent compliance report AHI significantly reduced to 0.9 Patient wears full facemask   Encouraged proper weight management.  Important to get eight or more hours of sleep  Limiting the use of the computer and television before bedtime.  Decrease naps during the day, so night time sleep will become enhanced.  Limit caffeine, and sleep deprivation.  HTN, stroke, uncontrolled diabetes and heart failure are potential risk factors.  Risk of untreated sleep  apnea including cardiac arrhthymias, stroke, DM, pulm HTN.    Obesity -recommend significant weight loss -recommend changing diet  Deconditioned state -Recommend increased daily activity and exercise  Follow-up cardiology as scheduled    MEDICATION ADJUSTMENTS/LABS AND TESTS ORDERED: Continue OSA therapy as prescribed   CURRENT MEDICATIONS REVIEWED AT LENGTH WITH PATIENT TODAY   Patient  satisfied with Plan of action and management. All questions answered  Follow up 1 year  Total Time Spent  25 mins   Breely Panik Patricia Pesa, M.D.  [facility]  Medical Director [place] Director [facility] Cardio-Pulmonary Department

## 2021-01-04 NOTE — Patient Instructions (Addendum)
Continue therapy for sleep apnea as prescribed  Keep up the great work    Recommend weight loss

## 2021-01-05 ENCOUNTER — Other Ambulatory Visit: Payer: Self-pay

## 2021-01-05 DIAGNOSIS — E039 Hypothyroidism, unspecified: Secondary | ICD-10-CM

## 2021-02-10 ENCOUNTER — Encounter (INDEPENDENT_AMBULATORY_CARE_PROVIDER_SITE_OTHER): Payer: Self-pay

## 2021-02-10 ENCOUNTER — Encounter: Payer: Self-pay | Admitting: Podiatry

## 2021-02-10 ENCOUNTER — Ambulatory Visit: Payer: Medicare HMO | Admitting: Podiatry

## 2021-02-10 ENCOUNTER — Other Ambulatory Visit: Payer: Self-pay

## 2021-02-10 DIAGNOSIS — E119 Type 2 diabetes mellitus without complications: Secondary | ICD-10-CM

## 2021-02-10 DIAGNOSIS — M79675 Pain in left toe(s): Secondary | ICD-10-CM | POA: Diagnosis not present

## 2021-02-10 DIAGNOSIS — B351 Tinea unguium: Secondary | ICD-10-CM | POA: Diagnosis not present

## 2021-02-10 DIAGNOSIS — M79674 Pain in right toe(s): Secondary | ICD-10-CM

## 2021-02-10 NOTE — Progress Notes (Signed)
This patient returns to my office for at risk foot care.  This patient requires this care by a professional since this patient will be at risk due to having diabetic neuropathy.  This patient is unable to cut nails herself since the patient cannot reach hernails.These nails are painful walking and wearing shoes.  This patient presents for at risk foot care today.  General Appearance  Alert, conversant and in no acute stress.  Vascular  Dorsalis pedis and posterior tibial  pulses are palpable  bilaterally.  Capillary return is within normal limits  bilaterally. Temperature is within normal limits  bilaterally.  Neurologic  Senn-Weinstein monofilament wire test diminished   bilaterally. Muscle power within normal limits bilaterally.  Nails Thick disfigured discolored nails with subungual debris  from hallux to fifth toes bilaterally. No evidence of bacterial infection or drainage bilaterally.  Orthopedic  No limitations of motion  feet .  No crepitus or effusions noted.  No bony pathology or digital deformities noted.  Skin  normotropic skin with no porokeratosis noted bilaterally.  No signs of infections or ulcers noted.     Onychomycosis  Pain in right toes  Pain in left toes  Consent was obtained for treatment procedures.   Mechanical debridement of nails 1-5  bilaterally performed with a nail nipper.  Filed with dremel without incident.    Return office visit   3 months                   Told patient to return for periodic foot care and evaluation due to potential at risk complications.   Marshay Slates DPM   

## 2021-03-08 ENCOUNTER — Ambulatory Visit: Payer: Medicare HMO | Admitting: Cardiovascular Disease

## 2021-03-08 ENCOUNTER — Other Ambulatory Visit: Payer: Self-pay

## 2021-03-08 ENCOUNTER — Encounter: Payer: Self-pay | Admitting: Cardiovascular Disease

## 2021-03-08 VITALS — BP 124/68 | HR 58 | Ht 64.0 in | Wt 251.5 lb

## 2021-03-08 DIAGNOSIS — E119 Type 2 diabetes mellitus without complications: Secondary | ICD-10-CM | POA: Diagnosis not present

## 2021-03-08 DIAGNOSIS — I10 Essential (primary) hypertension: Secondary | ICD-10-CM

## 2021-03-08 DIAGNOSIS — I2510 Atherosclerotic heart disease of native coronary artery without angina pectoris: Secondary | ICD-10-CM

## 2021-03-08 DIAGNOSIS — E785 Hyperlipidemia, unspecified: Secondary | ICD-10-CM | POA: Diagnosis not present

## 2021-03-08 NOTE — Progress Notes (Signed)
Cardiology Office Note   Date:  03/08/2021   ID:  Taylor Shaw, DOB Jun 28, 1940, MRN [id number]  PCP:  McLean-Scocuzza, Nino Glow, MD  Cardiologist:   Kathlyn Sacramento, MD   Chief Complaint  Patient presents with   Other    2 month f/u no complaints today. Meds reviewed verbally with pt.      History of Present Illness: Taylor Shaw is a 80 y.o. female who is here today for follow-up visit regarding mild nonobstructive coronary artery disease.    She has past medical history of type 2 diabetes, essential hypertension, GERD, hyperlipidemia, obesity, and obstructive sleep apnea on CPAP.  She was seen few months ago for significant exertional dyspnea without chest discomfort.  She underwent cardiac CTA which showed a calcium score of 629 with calcified plaque in the proximal LAD causing mild stenosis.  No evidence of obstructive disease. Echocardiogram showed normal LV systolic function with grade 1 diastolic dysfunction and trivial mitral regurgitation.  She is overall doing reasonably well with improvement in symptoms overall.   Past Medical History:  Diagnosis Date   Arthritis    Diabetes ([place]) 02/19/2015   Diverticulosis 12/07/2016   Sigmoid Colon   Diverticulosis of sigmoid colon 12/08/2016   Elevated blood sugar 01/15/2015   GERD (gastroesophageal reflux disease)    Hemorrhoids    History of blood transfusion    Hyperlipidemia    Hypertension 01/15/2015   Obesity 01/15/2015   OSA on CPAP     Past Surgical History:  Procedure Laterality Date   BOTOX INJECTION N/A 03/27/2018   Procedure: BOTOX INJECTION;  Surgeon: Jules Husbands, MD;  Location: [facility];  Service: General;  Laterality: N/A;   CATARACT EXTRACTION W/PHACO Left 05/31/2020   Procedure: CATARACT EXTRACTION PHACO AND INTRAOCULAR LENS PLACEMENT ([place]) LEFT;  Surgeon: Eulogio Bear, MD;  Location: [place];  Service: Ophthalmology;  Laterality: Left;  4.16 0:40.4   CATARACT EXTRACTION  W/PHACO Right 07/05/2020   Procedure: CATARACT EXTRACTION PHACO AND INTRAOCULAR LENS PLACEMENT ([facility]) RIGHT DIABETIC 3.20 00:26.3;  Surgeon: Eulogio Bear, MD;  Location: [place];  Service: Ophthalmology;  Laterality: Right;  Diabetic - oral meds   COLONOSCOPY     COLONOSCOPY WITH PROPOFOL N/A 12/07/2016   Procedure: COLONOSCOPY WITH PROPOFOL;  Surgeon: [name], MD;  Location: [place];  Service: Endoscopy;  Laterality: N/A;  Diabetic - oral meds   EVALUATION UNDER ANESTHESIA WITH ANAL FISSUROTOMY N/A 03/27/2018   Procedure: EXAM UNDER ANESTHESIA, CHEMICAL SPHINCTEROTOMY;  Surgeon: Jules Husbands, MD;  Location: [facility];  Service: General;  Laterality: N/A;   HEMORRHOID SURGERY N/A 06/13/2018   Procedure: HEMORRHOIDECTOMY;  Surgeon: Jules Husbands, MD;  Location: [facility];  Service: General;  Laterality: N/A;   JOINT REPLACEMENT     b/l hips in [place] 2004 and 2005 Dr. Alfonso Ramus    POLYPECTOMY  12/07/2016   Procedure: POLYPECTOMY;  Surgeon: [name], MD;  Location: [place];  Service: Endoscopy;;   RECTAL EXAM UNDER ANESTHESIA N/A 06/13/2018   Procedure: RECTAL EXAM UNDER ANESTHESIA;  Surgeon: Jules Husbands, MD;  Location: [facility];  Service: General;  Laterality: N/A;   TOTAL HIP ARTHROPLASTY Bilateral    TUBAL LIGATION       Current Outpatient Medications  Medication Sig Dispense Refill   amLODipine (NORVASC) 10 MG tablet Take 1 tablet (10 mg total) by mouth daily. 90 tablet 3   atorvastatin (LIPITOR) 10 MG tablet Take  1 tablet (10 mg total) by mouth daily at 6 PM. 90 tablet 3   Blood Glucose Calibration (TRUE METRIX LEVEL 2) Normal SOLN      blood glucose meter kit and supplies Dispense based on patient and insurance preference. Use up to four times daily as directed. (FOR ICD-10 E10.9, E11.9). 1 each 0   Calcium Carb-Cholecalciferol 600-800 MG-UNIT TABS Take 1 tablet by mouth 2 (two) times daily.     carvedilol (COREG) 25 MG tablet Take 1  tablet (25 mg total) by mouth 2 (two) times daily with a meal. 180 tablet 3   docusate sodium (COLACE) 100 MG capsule Take 100 mg by mouth 2 (two) times daily as needed.     hydrALAZINE (APRESOLINE) 10 MG tablet Take 1 tablet (10 mg total) by mouth in the morning and at bedtime. 180 tablet 3   levothyroxine (SYNTHROID) 75 MCG tablet Take 1 tablet (75 mcg total) by mouth daily before breakfast. 30 minutes before food 90 tablet 3   losartan-hydrochlorothiazide (HYZAAR) 100-25 MG tablet Take 1 tablet by mouth daily. In am 90 tablet 3   metFORMIN (GLUCOPHAGE) 500 MG tablet 1 pill daily in the am with food 90 tablet 3   Omega-3 Fatty Acids (FISH OIL) 1000 MG CAPS Take 1,000 mg by mouth 2 (two) times daily.      omeprazole (PRILOSEC) 20 MG capsule Take 1 capsule (20 mg total) by mouth daily. 30 min before food 90 capsule 3   TRUE METRIX BLOOD GLUCOSE TEST test strip      TRUEplus Lancets 33G MISC      No current facility-administered medications for this visit.    Allergies:   Patient has no known allergies.    Social History:  The patient  reports that she has never smoked. She has never used smokeless tobacco. She reports that she does not drink alcohol and does not use drugs.   Family History:  The patient's family history includes Alcohol abuse in her father; Alzheimer's disease in her sister; Arthritis in her mother; Breast cancer (age of onset: 80) in her sister; COPD in her brother; Cancer in her brother, brother, daughter, daughter, and sister; Cirrhosis in her father; Dementia in her mother and sister; Hypertension in her mother.    ROS:  Please see the history of present illness.   Otherwise, review of systems are positive for none.   All other systems are reviewed and negative.    PHYSICAL EXAM: VS:  BP 124/68 (BP Location: Left Arm, Patient Position: Sitting, Cuff Size: Large)   Pulse (!) 58   Ht 5' 4"  (1.626 m)   Wt 251 lb 8 oz (114.1 kg)   SpO2 93%   BMI 43.17 kg/m  , BMI Body  mass index is 43.17 kg/m. GEN: Well nourished, well developed, in no acute distress  HEENT: normal  Neck: no JVD, carotid bruits, or masses Cardiac: RRR; no rubs, or gallops,no edema  Respiratory:  clear to auscultation bilaterally, normal work of breathing GI: soft, nontender, nondistended, + BS MS: no deformity or atrophy  Skin: warm and dry, no rash Neuro:  Strength and sensation are intact Psych: euthymic mood, full affect   EKG:  EKG is ordered today. The ekg ordered today demonstrates sinus bradycardia with a heart rate of 50-minute.  No significant ST or T wave changes.   Recent Labs: 10/26/2020: ALT 6; BUN 17; Creatinine, Ser 0.88; Hemoglobin 12.2; Platelets 236.0; Potassium 3.7; Sodium 140; TSH 3.02  Lipid Panel    Component Value Date/Time   CHOL 137 10/26/2020 0852   CHOL 103 01/19/2015 0811   TRIG 74.0 10/26/2020 0852   HDL 55.20 10/26/2020 0852   HDL 50 01/19/2015 0811   CHOLHDL 2 10/26/2020 0852   VLDL 14.8 10/26/2020 0852   LDLCALC 67 10/26/2020 0852   LDLCALC 42 01/19/2015 0811      Wt Readings from Last 3 Encounters:  03/08/21 251 lb 8 oz (114.1 kg)  01/04/21 253 lb (114.8 kg)  01/03/21 253 lb (114.8 kg)        PAD Screen 10/29/2020  Previous PAD dx? No  Previous surgical procedure? No  Pain with walking? No  Feet/toe relief with dangling? No  Painful, non-healing ulcers? No  Extremities discolored? No      ASSESSMENT AND PLAN:  1.  Coronary artery disease involving native coronary arteries without angina: Cardiac CTA showed mild nonobstructive disease for which I recommend aggressive medical therapy.  Her symptoms improved overall.  2.  Essential hypertension: Blood pressure is controlled on current medications.  3.  Hyperlipidemia: Given the presence of coronary atherosclerosis, I recommend continuing atorvastatin with a target LDL of less than 70.  Most recent lipid profile showed an LDL of 67.  4.  Type 2 diabetes: Generally well  controlled with most recent hemoglobin A1c of 6.4.   Disposition:   FU with me in 12 months  Signed,  Kathlyn Sacramento, MD  03/08/2021 4:07 PM    [place]

## 2021-03-08 NOTE — Patient Instructions (Signed)

## 2021-04-06 DIAGNOSIS — G4733 Obstructive sleep apnea (adult) (pediatric): Secondary | ICD-10-CM | POA: Diagnosis not present

## 2021-05-03 ENCOUNTER — Other Ambulatory Visit: Payer: Self-pay

## 2021-05-03 ENCOUNTER — Encounter: Payer: Self-pay | Admitting: Internal Medicine

## 2021-05-03 ENCOUNTER — Ambulatory Visit (INDEPENDENT_AMBULATORY_CARE_PROVIDER_SITE_OTHER): Payer: Medicare HMO | Admitting: Internal Medicine

## 2021-05-03 VITALS — BP 132/82 | HR 64 | Temp 97.0°F | Ht 64.0 in | Wt 254.4 lb

## 2021-05-03 DIAGNOSIS — E119 Type 2 diabetes mellitus without complications: Secondary | ICD-10-CM | POA: Diagnosis not present

## 2021-05-03 DIAGNOSIS — I152 Hypertension secondary to endocrine disorders: Secondary | ICD-10-CM

## 2021-05-03 DIAGNOSIS — Z23 Encounter for immunization: Secondary | ICD-10-CM | POA: Diagnosis not present

## 2021-05-03 DIAGNOSIS — E1159 Type 2 diabetes mellitus with other circulatory complications: Secondary | ICD-10-CM

## 2021-05-03 DIAGNOSIS — I10 Essential (primary) hypertension: Secondary | ICD-10-CM | POA: Diagnosis not present

## 2021-05-03 DIAGNOSIS — Z1231 Encounter for screening mammogram for malignant neoplasm of breast: Secondary | ICD-10-CM | POA: Diagnosis not present

## 2021-05-03 DIAGNOSIS — R809 Proteinuria, unspecified: Secondary | ICD-10-CM

## 2021-05-03 DIAGNOSIS — E039 Hypothyroidism, unspecified: Secondary | ICD-10-CM | POA: Diagnosis not present

## 2021-05-03 DIAGNOSIS — E042 Nontoxic multinodular goiter: Secondary | ICD-10-CM

## 2021-05-03 DIAGNOSIS — K219 Gastro-esophageal reflux disease without esophagitis: Secondary | ICD-10-CM

## 2021-05-03 DIAGNOSIS — E785 Hyperlipidemia, unspecified: Secondary | ICD-10-CM

## 2021-05-03 LAB — CBC WITH DIFFERENTIAL/PLATELET
Basophils Absolute: 0.1 10*3/uL (ref 0.0–0.1)
Basophils Relative: 0.8 % (ref 0.0–3.0)
Eosinophils Absolute: 0.3 10*3/uL (ref 0.0–0.7)
Eosinophils Relative: 3.8 % (ref 0.0–5.0)
HCT: 38.4 % (ref 36.0–46.0)
Hemoglobin: 12.2 g/dL (ref 12.0–15.0)
Lymphocytes Relative: 30 % (ref 12.0–46.0)
Lymphs Abs: 2.4 10*3/uL (ref 0.7–4.0)
MCHC: 31.8 g/dL (ref 30.0–36.0)
MCV: 81.6 fl (ref 78.0–100.0)
Monocytes Absolute: 0.5 10*3/uL (ref 0.1–1.0)
Monocytes Relative: 6.9 % (ref 3.0–12.0)
Neutro Abs: 4.6 10*3/uL (ref 1.4–7.7)
Neutrophils Relative %: 58.5 % (ref 43.0–77.0)
Platelets: 232 10*3/uL (ref 150.0–400.0)
RBC: 4.71 Mil/uL (ref 3.87–5.11)
RDW: 14.4 % (ref 11.5–15.5)
WBC: 7.9 10*3/uL (ref 4.0–10.5)

## 2021-05-03 LAB — COMPREHENSIVE METABOLIC PANEL
ALT: 8 U/L (ref 0–35)
AST: 13 U/L (ref 0–37)
Albumin: 4 g/dL (ref 3.5–5.2)
Alkaline Phosphatase: 63 U/L (ref 39–117)
BUN: 11 mg/dL (ref 6–23)
CO2: 33 mEq/L — ABNORMAL HIGH (ref 19–32)
Calcium: 9.6 mg/dL (ref 8.4–10.5)
Chloride: 102 mEq/L (ref 96–112)
Creatinine, Ser: 0.72 mg/dL (ref 0.40–1.20)
GFR: 79.67 mL/min (ref >60.00)
Glucose, Bld: 113 mg/dL — ABNORMAL HIGH (ref 70–99)
Potassium: 4.2 mEq/L (ref 3.5–5.1)
Sodium: 142 mEq/L (ref 135–145)
Total Bilirubin: 0.7 mg/dL (ref 0.2–1.2)
Total Protein: 7 g/dL (ref 6.0–8.3)

## 2021-05-03 LAB — HEMOGLOBIN A1C: Hgb A1c MFr Bld: 6.4 % (ref 4.6–6.5)

## 2021-05-03 LAB — LIPID PANEL
Cholesterol: 154 mg/dL (ref 0–200)
HDL: 65 mg/dL (ref >39.00)
LDL Cholesterol: 77 mg/dL (ref 0–99)
NonHDL: 89.45
Total CHOL/HDL Ratio: 2
Triglycerides: 60 mg/dL (ref 0.0–149.0)
VLDL: 12 mg/dL (ref 0.0–40.0)

## 2021-05-03 LAB — TSH: TSH: 3.52 u[IU]/mL (ref 0.35–5.50)

## 2021-05-03 MED ORDER — OMEPRAZOLE 20 MG PO CPDR: 20.0000 mg | DELAYED_RELEASE_CAPSULE | Freq: Every day | ORAL | 3 refills | Status: DC

## 2021-05-03 MED ORDER — HYDRALAZINE HCL 10 MG PO TABS: 10.0000 mg | ORAL_TABLET | Freq: Two times a day (BID) | ORAL | 3 refills | Status: DC

## 2021-05-03 MED ORDER — LOSARTAN POTASSIUM-HCTZ 100-25 MG PO TABS: 1.0000 | ORAL_TABLET | Freq: Every day | ORAL | 3 refills | Status: DC

## 2021-05-03 MED ORDER — CARVEDILOL 25 MG PO TABS: 25.0000 mg | ORAL_TABLET | Freq: Two times a day (BID) | ORAL | 3 refills | Status: DC

## 2021-05-03 MED ORDER — METFORMIN HCL 500 MG PO TABS: ORAL_TABLET | ORAL | 3 refills | Status: DC

## 2021-05-03 MED ORDER — ATORVASTATIN CALCIUM 10 MG PO TABS: 10.0000 mg | ORAL_TABLET | Freq: Every day | ORAL | 3 refills | Status: DC

## 2021-05-03 MED ORDER — LEVOTHYROXINE SODIUM 75 MCG PO TABS: 75.0000 ug | ORAL_TABLET | Freq: Every day | ORAL | 3 refills | Status: DC

## 2021-05-03 MED ORDER — AMLODIPINE BESYLATE 10 MG PO TABS: 10.0000 mg | ORAL_TABLET | Freq: Every day | ORAL | 3 refills | Status: DC

## 2021-05-03 NOTE — Patient Instructions (Addendum)
Consider another covid 19 booster in 07/2021  Happy Holidays

## 2021-05-03 NOTE — Progress Notes (Signed)
Chief Complaint  Patient presents with   Follow-up   Annual  1. Htn controlled norvasc 10 mg, coreg 25 bid, hydralazine 10 mg bid, hyzaar 100-25 mg qd  2. Hld lipitor 10 mg qhs  3. Hypothyroidism 75 mcg  4. Dm 2 metformin 500 mg  Review of Systems  Constitutional:  Negative for weight loss.  HENT:  Negative for hearing loss.   Eyes:  Negative for blurred vision.  Respiratory:  Negative for shortness of breath.   Cardiovascular:  Negative for chest pain.  Gastrointestinal:  Negative for abdominal pain and blood in stool.  Genitourinary:  Negative for dysuria.  Musculoskeletal:  Negative for falls and joint pain.  Skin:  Negative for rash.  Neurological:  Negative for headaches.  Psychiatric/Behavioral:  Negative for depression.   Past Medical History:  Diagnosis Date   Arthritis    Diabetes ([place]) 02/19/2015   Diverticulosis 12/07/2016   Sigmoid Colon   Diverticulosis of sigmoid colon 12/08/2016   Elevated blood sugar 01/15/2015   GERD (gastroesophageal reflux disease)    Hemorrhoids    History of blood transfusion    Hyperlipidemia    Hypertension 01/15/2015   Obesity 01/15/2015   OSA on CPAP    Past Surgical History:  Procedure Laterality Date   BOTOX INJECTION N/A 03/27/2018   Procedure: BOTOX INJECTION;  Surgeon: Jules Husbands, MD;  Location: [facility];  Service: General;  Laterality: N/A;   CATARACT EXTRACTION W/PHACO Left 05/31/2020   Procedure: CATARACT EXTRACTION PHACO AND INTRAOCULAR LENS PLACEMENT ([place]) LEFT;  Surgeon: Eulogio Bear, MD;  Location: [place];  Service: Ophthalmology;  Laterality: Left;  4.16 0:40.4   CATARACT EXTRACTION W/PHACO Right 07/05/2020   Procedure: CATARACT EXTRACTION PHACO AND INTRAOCULAR LENS PLACEMENT ([facility]) RIGHT DIABETIC 3.20 00:26.3;  Surgeon: Eulogio Bear, MD;  Location: [place];  Service: Ophthalmology;  Laterality: Right;  Diabetic - oral meds   COLONOSCOPY     COLONOSCOPY WITH PROPOFOL N/A 12/07/2016    Procedure: COLONOSCOPY WITH PROPOFOL;  Surgeon: [name], MD;  Location: [place];  Service: Endoscopy;  Laterality: N/A;  Diabetic - oral meds   EVALUATION UNDER ANESTHESIA WITH ANAL FISSUROTOMY N/A 03/27/2018   Procedure: EXAM UNDER ANESTHESIA, CHEMICAL SPHINCTEROTOMY;  Surgeon: Jules Husbands, MD;  Location: [facility];  Service: General;  Laterality: N/A;   HEMORRHOID SURGERY N/A 06/13/2018   Procedure: [id number];  Surgeon: Jules Husbands, MD;  Location: [facility];  Service: General;  Laterality: N/A;   JOINT REPLACEMENT     b/l hips in [place] 2004 and 2005 Dr. Alfonso Ramus    POLYPECTOMY  12/07/2016   Procedure: POLYPECTOMY;  Surgeon: [name], MD;  Location: [place];  Service: Endoscopy;;   RECTAL EXAM UNDER ANESTHESIA N/A 06/13/2018   Procedure: RECTAL EXAM UNDER ANESTHESIA;  Surgeon: Jules Husbands, MD;  Location: [facility];  Service: General;  Laterality: N/A;   TOTAL HIP ARTHROPLASTY Bilateral    TUBAL LIGATION     Family History  Problem Relation Age of Onset   Dementia Mother    Hypertension Mother    Arthritis Mother    Alcohol abuse Father    Cirrhosis Father    Cancer Daughter        breast   COPD Brother    Breast cancer Sister 48   Cancer Sister        ? type    Dementia Sister    Cancer Brother        ?  type   Cancer Brother        cancer ?type   Cancer Daughter        cancer ?type died 96    Alzheimer's disease Sister    Social History   Socioeconomic History   Marital status: Divorced    Spouse name: Not on file   Number of children: Not on file   Years of education: Not on file   Highest education level: Not on file  Occupational History   Occupation: Astronomer covers    Comment: retired  Tobacco Use   Smoking status: Never   Smokeless tobacco: Never   Tobacco comments:    Never   Scientific laboratory technician Use: Never used  Substance and Sexual Activity   Alcohol use: No    Alcohol/week: 0.0  standard drinks   Drug use: No   Sexual activity: Not Currently  Other Topics Concern   Not on file  Social History Narrative   Lives with daughter [name] to go to church    Moved from [place] 3-4 years ago    No guns, wears seat belt    Never smoker    Social Determinants of Radio broadcast assistant Strain: Low Risk    Difficulty of Paying Living Expenses: Not hard at all  Food Insecurity: No Food Insecurity   Worried About Charity fundraiser in the Last Year: Never true   Arboriculturist in the Last Year: Never true  Transportation Needs: No Transportation Needs   Lack of Transportation (Medical): No   Lack of Transportation (Non-Medical): No  Physical Activity: Not on file  Stress: No Stress Concern Present   Feeling of Stress : Not at all  Social Connections: Unknown   Frequency of Communication with Friends and Family: More than three times a week   Frequency of Social Gatherings with Friends and Family: Not on file   Attends Religious Services: Not on Electrical engineer or Organizations: Not on file   Attends Archivist Meetings: Not on file   Marital Status: Not on file  Intimate Partner Violence: Not At Risk   Fear of Current or Ex-Partner: No   Emotionally Abused: No   Physically Abused: No   Sexually Abused: No   Current Meds  Medication Sig   amLODipine (NORVASC) 10 MG tablet Take 1 tablet (10 mg total) by mouth daily.   atorvastatin (LIPITOR) 10 MG tablet Take 1 tablet (10 mg total) by mouth daily at 6 PM.   Blood Glucose Calibration (TRUE METRIX LEVEL 2) Normal SOLN    blood glucose meter kit and supplies Dispense based on patient and insurance preference. Use up to four times daily as directed. (FOR ICD-10 E10.9, E11.9).   Calcium Carb-Cholecalciferol 600-800 MG-UNIT TABS Take 1 tablet by mouth 2 (two) times daily.   carvedilol (COREG) 25 MG tablet Take 1 tablet (25 mg total) by mouth 2 (two) times daily with a meal.    docusate sodium (COLACE) 100 MG capsule Take 100 mg by mouth 2 (two) times daily as needed.   hydrALAZINE (APRESOLINE) 10 MG tablet Take 1 tablet (10 mg total) by mouth in the morning and at bedtime.   levothyroxine (SYNTHROID) 75 MCG tablet Take 1 tablet (75 mcg total) by mouth daily before breakfast. 30 minutes before food   losartan-hydrochlorothiazide (HYZAAR) 100-25 MG tablet Take 1 tablet by mouth daily. In  am   metFORMIN (GLUCOPHAGE) 500 MG tablet 1 pill daily in the am with food   Omega-3 Fatty Acids (FISH OIL) 1000 MG CAPS Take 1,000 mg by mouth 2 (two) times daily.    omeprazole (PRILOSEC) 20 MG capsule Take 1 capsule (20 mg total) by mouth daily. 30 min before food   TRUE METRIX BLOOD GLUCOSE TEST test strip    TRUEplus Lancets 33G MISC    No Known Allergies No results found for this or any previous visit (from the past 2160 hour(s)). Objective  Body mass index is 43.67 kg/m. Wt Readings from Last 3 Encounters:  05/03/21 254 lb 6.4 oz (115.4 kg)  03/08/21 251 lb 8 oz (114.1 kg)  01/04/21 253 lb (114.8 kg)   Temp Readings from Last 3 Encounters:  05/03/21 (!) 97 F (36.1 C) (Temporal)  01/04/21 98.3 F (36.8 C) (Oral)  10/26/20 (!) 96.6 F (35.9 C) (Temporal)   BP Readings from Last 3 Encounters:  05/03/21 132/82  03/08/21 124/68  01/04/21 130/70   Pulse Readings from Last 3 Encounters:  05/03/21 64  03/08/21 (!) 58  01/04/21 68    Physical Exam Vitals and nursing note reviewed.  Constitutional:      Appearance: Normal appearance. She is well-developed and well-groomed.  HENT:     Head: Normocephalic and atraumatic.  Eyes:     Conjunctiva/sclera: Conjunctivae normal.     Pupils: Pupils are equal, round, and reactive to light.  Cardiovascular:     Rate and Rhythm: Normal rate and regular rhythm.     Heart sounds: Normal heart sounds. No murmur heard. Pulmonary:     Effort: Pulmonary effort is normal.     Breath sounds: Normal breath sounds.  Abdominal:      General: Abdomen is flat. Bowel sounds are normal.     Tenderness: There is no abdominal tenderness.  Musculoskeletal:        General: No tenderness.  Skin:    General: Skin is warm and dry.  Neurological:     General: No focal deficit present.     Mental Status: She is alert and oriented to person, place, and time. Mental status is at baseline.     Cranial Nerves: Cranial nerves 2-12 are intact.     Gait: Gait is intact.  Psychiatric:        Attention and Perception: Attention and perception normal.        Mood and Affect: Mood and affect normal.        Speech: Speech normal.        Behavior: Behavior normal. Behavior is cooperative.        Thought Content: Thought content normal.        Cognition and Memory: Cognition and memory normal.        Judgment: Judgment normal.    Assessment  Plan  Hypertension associated with diabetes ([place]) 6.4 - Plan: Comprehensive metabolic panel, Lipid panel, CBC with Differential/Platelet, Hemoglobin A1c, Microalbumin / creatinine urine ratio, Microalbumin / creatinine urine ratio, Lipid panel, Comprehensive metabolic panel, CBC with Differential/Platelet, Hemoglobin A1c norvasc 10 mg, coreg 25 bid, hydralazine 10 mg bid, hyzaar 100-25 mg qd  2. Hld lipitor 10 mg qhs   Proteinuria, unspecified type  Hyperlipidemia, unspecified hyperlipidemia type Lipitor 10 mg qhs  Multinodular goiter - Plan: TSH, TSH Hypothyroidism, unspecified type - Plan: TSH, TSH  On levo 75 mcg qd   HM Flu shot utd given today Tdap had 07/04/2018  pna 23 utd Consider  prevnar 20 In future  prevnar utd Had zostervax, shingrix had 2/2 3/3 covid 19 rec booster x 2   Mammogram 09/07/20 negative Out of age window pap  Colonoscopy had 12/07/16 IH, diverticulosis and tubular adenoma f/u in 5 years Dr. Allen Norris  DEXA get report prior [facility]CP Dr. [facility] pt had 5 years ago  -09/07/20 normal    OSA f/u pulm, [facility] sch 11/12/2018 and f/u pulm 11/19/2018, referred again     dentist appt sch 10/20/19  Eye doctor 06/2021  Provider: Dr. Olivia Mackie McLean-Scocuzza-Internal Medicine

## 2021-05-04 LAB — MICROALBUMIN / CREATININE URINE RATIO
Creatinine, Urine: 6 mg/dL — ABNORMAL LOW (ref 20–275)
Microalb Creat Ratio: 167 mcg/mg creat — ABNORMAL HIGH (ref <30)
Microalb, Ur: 1 mg/dL

## 2021-05-09 ENCOUNTER — Ambulatory Visit (INDEPENDENT_AMBULATORY_CARE_PROVIDER_SITE_OTHER): Payer: Medicare HMO

## 2021-05-09 VITALS — Ht 64.0 in | Wt 254.0 lb

## 2021-05-09 DIAGNOSIS — Z0000 Encounter for general adult medical examination without abnormal findings: Secondary | ICD-10-CM | POA: Diagnosis not present

## 2021-05-09 NOTE — Progress Notes (Signed)
Subjective:   JASELYNN TAMAS is a 80 y.o. female who presents for Medicare Annual (Subsequent) preventive examination.  Review of Systems    No ROS.  Medicare Wellness Virtual Visit.  Visual/audio telehealth visit, UTA vital signs.   See social history for additional risk factors.   Cardiac Risk Factors include: advanced age (>61mn, >>29women);diabetes mellitus;hypertension     Objective:    Today's Vitals   05/09/21 1117  Weight: 254 lb (115.2 kg)  Height: 5' 4"  (1.626 m)   Body mass index is 43.6 kg/m.  Advanced Directives 05/09/2021 07/05/2020 05/31/2020 05/06/2020 05/06/2019 06/12/2018 03/27/2018  Does Patient Have a Medical Advance Directive? Yes Yes Yes Yes Yes Yes No  Type of AParamed[place]of [place] will Healthcare Power of [place] will [place] will Living will;Healthcare Power of Attorney -  Does patient want to make changes to medical advance directive? No - Patient declined No - Guardian declined Yes (MAU/Ambulatory/Procedural Areas - Information given) No - Patient declined No - Patient declined No - Patient declined -  Copy of HGardenin Chart? No - copy requested No - copy requested - No - copy requested No - copy requested No - copy requested -  Would patient like information on creating a medical advance directive? - - - - - - -    Current Medications (verified) Outpatient Encounter Medications as of 05/09/2021  Medication Sig   amLODipine (NORVASC) 10 MG tablet Take 1 tablet (10 mg total) by mouth daily.   atorvastatin (LIPITOR) 10 MG tablet Take 1 tablet (10 mg total) by mouth daily at 6 PM.   Blood Glucose Calibration (TRUE METRIX LEVEL 2) Normal SOLN    blood glucose meter kit and supplies Dispense based on patient and insurance preference. Use up to four times daily as directed. (FOR ICD-10 E10.9, E11.9).   Calcium Carb-Cholecalciferol 600-800  MG-UNIT TABS Take 1 tablet by mouth 2 (two) times daily.   carvedilol (COREG) 25 MG tablet Take 1 tablet (25 mg total) by mouth 2 (two) times daily with a meal.   docusate sodium (COLACE) 100 MG capsule Take 100 mg by mouth 2 (two) times daily as needed.   hydrALAZINE (APRESOLINE) 10 MG tablet Take 1 tablet (10 mg total) by mouth in the morning and at bedtime.   levothyroxine (SYNTHROID) 75 MCG tablet Take 1 tablet (75 mcg total) by mouth daily before breakfast. 30 minutes before food   losartan-hydrochlorothiazide (HYZAAR) 100-25 MG tablet Take 1 tablet by mouth daily. In am   metFORMIN (GLUCOPHAGE) 500 MG tablet 1 pill daily in the am with food   Omega-3 Fatty Acids (FISH OIL) 1000 MG CAPS Take 1,000 mg by mouth 2 (two) times daily.    omeprazole (PRILOSEC) 20 MG capsule Take 1 capsule (20 mg total) by mouth daily. 30 min before food   TRUE METRIX BLOOD GLUCOSE TEST test strip    TRUEplus Lancets 33G MISC    No facility-administered encounter medications on file as of 05/09/2021.    Allergies (verified) Patient has no known allergies.   History: Past Medical History:  Diagnosis Date   Arthritis    Diabetes ([place] 02/19/2015   Diverticulosis 12/07/2016   Sigmoid Colon   Diverticulosis of sigmoid colon 12/08/2016   Elevated blood sugar 01/15/2015   GERD (gastroesophageal reflux disease)    Hemorrhoids    History of blood transfusion    Hyperlipidemia  Hypertension 01/15/2015   Obesity 01/15/2015   OSA on CPAP    Past Surgical History:  Procedure Laterality Date   BOTOX INJECTION N/A 03/27/2018   Procedure: BOTOX INJECTION;  Surgeon: Jules Husbands, MD;  Location: [facility];  Service: General;  Laterality: N/A;   CATARACT EXTRACTION W/PHACO Left 05/31/2020   Procedure: CATARACT EXTRACTION PHACO AND INTRAOCULAR LENS PLACEMENT ([place]) LEFT;  Surgeon: Eulogio Bear, MD;  Location: [place];  Service: Ophthalmology;  Laterality: Left;  4.16 0:40.4   CATARACT EXTRACTION  W/PHACO Right 07/05/2020   Procedure: CATARACT EXTRACTION PHACO AND INTRAOCULAR LENS PLACEMENT ([facility]) RIGHT DIABETIC 3.20 00:26.3;  Surgeon: Eulogio Bear, MD;  Location: [place];  Service: Ophthalmology;  Laterality: Right;  Diabetic - oral meds   COLONOSCOPY     COLONOSCOPY WITH PROPOFOL N/A 12/07/2016   Procedure: COLONOSCOPY WITH PROPOFOL;  Surgeon: [name], MD;  Location: [place];  Service: Endoscopy;  Laterality: N/A;  Diabetic - oral meds   EVALUATION UNDER ANESTHESIA WITH ANAL FISSUROTOMY N/A 03/27/2018   Procedure: EXAM UNDER ANESTHESIA, CHEMICAL SPHINCTEROTOMY;  Surgeon: Jules Husbands, MD;  Location: [facility];  Service: General;  Laterality: N/A;   HEMORRHOID SURGERY N/A 06/13/2018   Procedure: HEMORRHOIDECTOMY;  Surgeon: Jules Husbands, MD;  Location: [facility];  Service: General;  Laterality: N/A;   JOINT REPLACEMENT     b/l hips in [place] 2004 and 2005 Dr. Alfonso Ramus    POLYPECTOMY  12/07/2016   Procedure: POLYPECTOMY;  Surgeon: [name], MD;  Location: [place];  Service: Endoscopy;;   RECTAL EXAM UNDER ANESTHESIA N/A 06/13/2018   Procedure: RECTAL EXAM UNDER ANESTHESIA;  Surgeon: Jules Husbands, MD;  Location: [facility];  Service: General;  Laterality: N/A;   TOTAL HIP ARTHROPLASTY Bilateral    TUBAL LIGATION     Family History  Problem Relation Age of Onset   Dementia Mother    Hypertension Mother    Arthritis Mother    Alcohol abuse Father    Cirrhosis Father    Cancer Daughter        breast   COPD Brother    Breast cancer Sister 62   Cancer Sister        ? type    Dementia Sister    Cancer Brother        ?type   Cancer Brother        cancer ?type   Cancer Daughter        cancer ?type died 28    Alzheimer's disease Sister    Social History   Socioeconomic History   Marital status: Divorced    Spouse name: Not on file   Number of children: Not on file   Years of education: Not on file   Highest education level: Not  on file  Occupational History   Occupation: Astronomer covers    Comment: retired  Tobacco Use   Smoking status: Never   Smokeless tobacco: Never   Tobacco comments:    Never   Scientific laboratory technician Use: Never used  Substance and Sexual Activity   Alcohol use: No    Alcohol/week: 0.0 standard drinks   Drug use: No   Sexual activity: Not Currently  Other Topics Concern   Not on file  Social History Narrative   Lives with daughter [name] to go to church    Moved from [place] 3-4 years ago  No guns, wears seat belt    Never smoker    Social Determinants of Radio broadcast assistant Strain: Low Risk    Difficulty of Paying Living Expenses: Not hard at all  Food Insecurity: No Food Insecurity   Worried About Charity fundraiser in the Last Year: Never true   Ran Out of Food in the Last Year: Never true  Transportation Needs: No Transportation Needs   Lack of Transportation (Medical): No   Lack of Transportation (Non-Medical): No  Physical Activity: Not on file  Stress: No Stress Concern Present   Feeling of Stress : Not at all  Social Connections: Unknown   Frequency of Communication with Friends and Family: More than three times a week   Frequency of Social Gatherings with Friends and Family: Once a week   Attends Religious Services: Not on Electrical engineer or Organizations: Not on file   Attends Archivist Meetings: Not on file   Marital Status: Not on file    Tobacco Counseling Counseling given: Not Answered Tobacco comments: Never    Clinical Intake:  Pre-visit preparation completed: Yes        Diabetes: No Nutrition Risk Assessment: Has the patient had any N/V/D within the last 2 months?  No  Does the patient have any non-healing wounds?  No  Has the patient had any unintentional weight loss or weight gain?  No   Diabetes: How often do you monitor your CBG's? Once a month.    Financial Strains and Diabetes Management: Are you having any financial strains with the device, your supplies or your medication? No .  Does the patient want to be seen by Chronic Care Management for management of their diabetes?  No  Would the patient like to be referred to a Nutritionist or for Diabetic Management?  No   How often do you need to have someone help you when you read instructions, pamphlets, or other written materials from your doctor or pharmacy?: 1 - Never    Interpreter Needed?: No      Activities of Daily Living In your present state of health, do you have any difficulty performing the following activities: 05/09/2021 07/05/2020  Hearing? N N  Vision? N N  Difficulty concentrating or making decisions? N N  Walking or climbing stairs? N N  Dressing or bathing? N N  Doing errands, shopping? N -  Preparing Food and eating ? N -  Using the Toilet? N -  In the past six months, have you accidently leaked urine? N -  Do you have problems with loss of bowel control? N -  Managing your Medications? N -  Managing your Finances? N -  Housekeeping or managing your Housekeeping? N -  Some recent data might be hidden    Patient Care Team: McLean-Scocuzza, Nino Glow, MD as PCP - General (Internal Medicine) Wellington Hampshire, MD as PCP - Cardiology (Cardiology)  Indicate any recent Medical Services you may have received from other than [facility] providers in the past year (date may be approximate).     Assessment:   This is a routine wellness examination for Onetha.  Virtual Visit via Telephone Note  I connected with  Octavia Velador Liebler on 05/09/21 at 11:15 AM EST by telephone and verified that I am speaking with the correct person using two identifiers.  Location: Patient: home Provider: office Persons participating in the virtual visit: patient/Nurse Health Advisor   I discussed  the limitations, risks, security and privacy concerns of performing an evaluation and  management service by telephone and the availability of in person appointments. The patient expressed understanding and agreed to proceed.  Interactive audio and video telecommunications were attempted between this nurse and patient, however failed, due to patient having technical difficulties OR patient did not have access to video capability.  We continued and completed visit with audio only.  Some vital signs may be absent or patient reported.   Hearing/Vision screen Hearing Screening - Comments:: Patient is able to hear conversational tones without difficulty. No issues reported. Vision Screening - Comments:: Wears corrective lenses  They have seen their ophthalmologist in the last 12 months.  Next appointment 08/16/21  Dietary issues and exercise activities discussed: Current Exercise Habits: Home exercise routine, Type of exercise: walking, Intensity: Mild Regular diet Good water intake  Goals Addressed             This Visit's Progress    Maintain Healthy Lifestyle       Stay active Stay hydrated Eat healthy       Depression Screen PHQ 2/9 Scores 05/09/2021 10/26/2020 05/06/2020 01/14/2020 10/14/2019 06/13/2019 05/06/2019  PHQ - 2 Score 0 1 0 0 0 0 0  PHQ- 9 Score - 4 - - - - -    Fall Risk Fall Risk  05/09/2021 10/26/2020 05/27/2020 05/06/2020 01/14/2020  Falls in the past year? 0 0 0 0 0  Number falls in past yr: 0 - 0 0 0  Injury with Fall? - - 0 0 0  Follow up Falls evaluation completed Falls evaluation completed Falls evaluation completed Falls evaluation completed Falls evaluation completed    [place]: Home free of loose throw rugs in walkways, pet beds, electrical cords, etc? Yes  Adequate lighting in your home to reduce risk of falls? Yes   ASSISTIVE DEVICES UTILIZED TO PREVENT FALLS: Life alert? No  Use of a cane, walker or w/c? No  Grab bars in the bathroom? Yes  Shower chair or bench in shower? Yes   TIMED UP AND GO: Was  the test performed? No .   Cognitive Function:  Patient is alert and oriented x3.  Enjoys going to church, working puzzles and word games for brain health.    6CIT Screen 05/09/2021 05/06/2020 05/06/2019  What Year? 0 points 0 points 0 points  What month? 0 points 0 points 0 points  What time? - 0 points 0 points  Count back from 20 0 points 0 points 0 points  Months in reverse 0 points - 0 points  Repeat phrase 0 points - 2 points  Total Score - - 2    Immunizations Immunization History  Administered Date(s) Administered   Fluad Quad(high Dose 65+) 02/04/2019, 05/27/2020, 05/03/2021   Influenza, High Dose Seasonal PF 02/19/2015, 04/03/2018   Influenza-Unspecified 03/16/2014, 02/27/2017   PFIZER(Purple Top)SARS-COV-2 Vaccination 07/09/2019, 07/30/2019   Pfizer Covid-19 Vaccine Bivalent Booster 15yr & up 01/07/2021   Pneumococcal Conjugate-13 07/04/2018   Pneumococcal-Unspecified 03/16/2014   Tdap 07/04/2018, 03/28/2019   Zoster Recombinat (Shingrix) 07/04/2018, 03/10/2019, 03/28/2019   Zoster, Live 04/16/2013   Pneumococcal vaccine status: Due, Education has been provided regarding the importance of this vaccine. Advised may receive this vaccine at local pharmacy or Health Dept. Aware to provide a copy of the vaccination record if obtained from local pharmacy or Health Dept. Verbalized acceptance and understanding. Deferred.   Shingrix Completed?: No.    Education has been  provided regarding the importance of this vaccine. Patient has been advised to call insurance company to determine out of pocket expense if they have not yet received this vaccine. Advised may also receive vaccine at local pharmacy or Health Dept. Verbalized acceptance and understanding.  Screening Tests Health Maintenance  Topic Date Due   OPHTHALMOLOGY EXAM  05/09/2021 (Originally 03/30/2021)   Pneumonia Vaccine 43+ Years old (2 - PPSV23 if available, else PCV20) 05/09/2022 (Originally 07/05/2019)   HEMOGLOBIN  A1C  10/31/2021   FOOT EXAM  02/10/2022   TETANUS/TDAP  03/27/2029   INFLUENZA VACCINE  Completed   DEXA SCAN  Completed   COVID-19 Vaccine  Completed   Zoster Vaccines- Shingrix  Completed   HPV VACCINES  Aged Out   Hepatitis C Screening  Discontinued   Health Maintenance There are no preventive care reminders to display for this patient.  Lung Cancer Screening: (Low Dose CT Chest recommended if Age 58-80 years, 30 pack-year currently smoking OR have quit w/in 15years.) does not qualify.   Hepatitis C Screening: does not qualify  Vision Screening: Recommended annual ophthalmology exams for early detection of glaucoma and other disorders of the eye.  Dental Screening: Recommended annual dental exams for proper oral hygiene  Community Resource Referral / Chronic Care Management: CRR required this visit?  No   CCM required this visit?  No      Plan:   Keep all routine maintenance appointments.   I have personally reviewed and noted the following in the patient's chart:   Medical and social history Use of alcohol, tobacco or illicit drugs  Current medications and supplements including opioid prescriptions. Not taking opioids.  Functional ability and status Nutritional status Physical activity Advanced directives List of other physicians Hospitalizations, surgeries, and ER visits in previous 12 months Vitals Screenings to include cognitive, depression, and falls Referrals and appointments  In addition, I have reviewed and discussed with patient certain preventive protocols, quality metrics, and best practice recommendations. A written personalized care plan for preventive services as well as general preventive health recommendations were provided to patient.     Varney Biles, LPN   [id number]

## 2021-05-09 NOTE — Patient Instructions (Addendum)
Taylor Shaw , Thank you for taking time to come for your Medicare Wellness Visit. I appreciate your ongoing commitment to your health goals. Please review the following plan we discussed and let me know if I can assist you in the future.   These are the goals we discussed:  Goals      Maintain Healthy Lifestyle     Stay active Stay hydrated Eat healthy        This is a list of the screening recommended for you and due dates:  Health Maintenance  Topic Date Due   Eye exam for diabetics  05/09/2021*   Pneumonia Vaccine (2 - PPSV23 if available, else PCV20) 05/09/2022*   Hemoglobin A1C  10/31/2021   Complete foot exam   02/10/2022   Tetanus Vaccine  03/27/2029   Flu Shot  Completed   DEXA scan (bone density measurement)  Completed   COVID-19 Vaccine  Completed   Zoster (Shingles) Vaccine  Completed   HPV Vaccine  Aged Out   Hepatitis C Screening: USPSTF Recommendation to screen - Ages 69-79 yo.  Discontinued  *Topic was postponed. The date shown is not the original due date.    Advanced directives: End of life planning; Advance aging; Advanced directives discussed.  Copy of current HCPOA/Living Will requested.    Conditions/risks identified: none new  Follow up in one year for your annual wellness visit    Preventive Care 65 Years and Older, Female Preventive care refers to lifestyle choices and visits with your health care provider that can promote health and wellness. What does preventive care include? A yearly physical exam. This is also called an annual well check. Dental exams once or twice a year. Routine eye exams. Ask your health care provider how often you should have your eyes checked. Personal lifestyle choices, including: Daily care of your teeth and gums. Regular physical activity. Eating a healthy diet. Avoiding tobacco and drug use. Limiting alcohol use. Practicing safe sex. Taking low-dose aspirin every day. Taking vitamin and mineral supplements as  recommended by your health care provider. What happens during an annual well check? The services and screenings done by your health care provider during your annual well check will depend on your age, overall health, lifestyle risk factors, and family history of disease. Counseling  Your health care provider may ask you questions about your: Alcohol use. Tobacco use. Drug use. Emotional well-being. Home and relationship well-being. Sexual activity. Eating habits. History of falls. Memory and ability to understand (cognition). Work and work Statistician. Reproductive health. Screening  You may have the following tests or measurements: Height, weight, and BMI. Blood pressure. Lipid and cholesterol levels. These may be checked every 5 years, or more frequently if you are over 63 years old. Skin check. Lung cancer screening. You may have this screening every year starting at age 52 if you have a 30-pack-year history of smoking and currently smoke or have quit within the past 15 years. Fecal occult blood test (FOBT) of the stool. You may have this test every year starting at age 28. Flexible sigmoidoscopy or colonoscopy. You may have a sigmoidoscopy every 5 years or a colonoscopy every 10 years starting at age 71. Hepatitis C blood test. Hepatitis B blood test. Sexually transmitted disease (STD) testing. Diabetes screening. This is done by checking your blood sugar (glucose) after you have not eaten for a while (fasting). You may have this done every 1-3 years. Bone density scan. This is done to screen for osteoporosis.  You may have this done starting at age 61. Mammogram. This may be done every 1-2 years. Talk to your health care provider about how often you should have regular mammograms. Talk with your health care provider about your test results, treatment options, and if necessary, the need for more tests. Vaccines  Your health care provider may recommend certain vaccines, such  as: Influenza vaccine. This is recommended every year. Tetanus, diphtheria, and acellular pertussis (Tdap, Td) vaccine. You may need a Td booster every 10 years. Zoster vaccine. You may need this after age 32. Pneumococcal 13-valent conjugate (PCV13) vaccine. One dose is recommended after age 66. Pneumococcal polysaccharide (PPSV23) vaccine. One dose is recommended after age 61. Talk to your health care provider about which screenings and vaccines you need and how often you need them. This information is not intended to replace advice given to you by your health care provider. Make sure you discuss any questions you have with your health care provider. Document Released: 06/18/2015 Document Revised: 02/09/2016 Document Reviewed: 03/23/2015 Elsevier Interactive Patient Education  2017 [place] Prevention in the Home Falls can cause injuries. They can happen to people of all ages. There are many things you can do to make your home safe and to help prevent falls. What can I do on the outside of my home? Regularly fix the edges of walkways and driveways and fix any cracks. Remove anything that might make you trip as you walk through a door, such as a raised step or threshold. Trim any bushes or trees on the path to your home. Use bright outdoor lighting. Clear any walking paths of anything that might make someone trip, such as rocks or tools. Regularly check to see if handrails are loose or broken. Make sure that both sides of any steps have handrails. Any raised decks and porches should have guardrails on the edges. Have any leaves, snow, or ice cleared regularly. Use sand or salt on walking paths during winter. Clean up any spills in your garage right away. This includes oil or grease spills. What can I do in the bathroom? Use night lights. Install grab bars by the toilet and in the tub and shower. Do not use towel bars as grab bars. Use non-skid mats or decals in the tub or  shower. If you need to sit down in the shower, use a plastic, non-slip stool. Keep the floor dry. Clean up any water that spills on the floor as soon as it happens. Remove soap buildup in the tub or shower regularly. Attach bath mats securely with double-sided non-slip rug tape. Do not have throw rugs and other things on the floor that can make you trip. What can I do in the bedroom? Use night lights. Make sure that you have a light by your bed that is easy to reach. Do not use any sheets or blankets that are too big for your bed. They should not hang down onto the floor. Have a firm chair that has side arms. You can use this for support while you get dressed. Do not have throw rugs and other things on the floor that can make you trip. What can I do in the kitchen? Clean up any spills right away. Avoid walking on wet floors. Keep items that you use a lot in easy-to-reach places. If you need to reach something above you, use a strong step stool that has a grab bar. Keep electrical cords out of the way. Do not use  floor polish or wax that makes floors slippery. If you must use wax, use non-skid floor wax. Do not have throw rugs and other things on the floor that can make you trip. What can I do with my stairs? Do not leave any items on the stairs. Make sure that there are handrails on both sides of the stairs and use them. Fix handrails that are broken or loose. Make sure that handrails are as long as the stairways. Check any carpeting to make sure that it is firmly attached to the stairs. Fix any carpet that is loose or worn. Avoid having throw rugs at the top or bottom of the stairs. If you do have throw rugs, attach them to the floor with carpet tape. Make sure that you have a light switch at the top of the stairs and the bottom of the stairs. If you do not have them, ask someone to add them for you. What else can I do to help prevent falls? Wear shoes that: Do not have high heels. Have  rubber bottoms. Are comfortable and fit you well. Are closed at the toe. Do not wear sandals. If you use a stepladder: Make sure that it is fully opened. Do not climb a closed stepladder. Make sure that both sides of the stepladder are locked into place. Ask someone to hold it for you, if possible. Clearly mark and make sure that you can see: Any grab bars or handrails. First and last steps. Where the edge of each step is. Use tools that help you move around (mobility aids) if they are needed. These include: Canes. Walkers. Scooters. Crutches. Turn on the lights when you go into a dark area. Replace any light bulbs as soon as they burn out. Set up your furniture so you have a clear path. Avoid moving your furniture around. If any of your floors are uneven, fix them. If there are any pets around you, be aware of where they are. Review your medicines with your doctor. Some medicines can make you feel dizzy. This can increase your chance of falling. Ask your doctor what other things that you can do to help prevent falls. This information is not intended to replace advice given to you by your health care provider. Make sure you discuss any questions you have with your health care provider. Document Released: 03/18/2009 Document Revised: 10/28/2015 Document Reviewed: 06/26/2014 Elsevier Interactive Patient Education  2017 [employer].

## 2021-05-12 ENCOUNTER — Encounter: Payer: Self-pay | Admitting: Podiatry

## 2021-05-12 ENCOUNTER — Ambulatory Visit: Payer: Medicare HMO | Admitting: Podiatry

## 2021-05-12 ENCOUNTER — Other Ambulatory Visit: Payer: Self-pay

## 2021-05-12 DIAGNOSIS — M79675 Pain in left toe(s): Secondary | ICD-10-CM

## 2021-05-12 DIAGNOSIS — E119 Type 2 diabetes mellitus without complications: Secondary | ICD-10-CM | POA: Diagnosis not present

## 2021-05-12 DIAGNOSIS — M79674 Pain in right toe(s): Secondary | ICD-10-CM | POA: Diagnosis not present

## 2021-05-12 DIAGNOSIS — B351 Tinea unguium: Secondary | ICD-10-CM

## 2021-05-12 NOTE — Progress Notes (Signed)
This patient returns to my office for at risk foot care.  This patient requires this care by a professional since this patient will be at risk due to having diabetic neuropathy.  This patient is unable to cut nails herself since the patient cannot reach hernails.These nails are painful walking and wearing shoes.  This patient presents for at risk foot care today.  General Appearance  Alert, conversant and in no acute stress.  Vascular  Dorsalis pedis and posterior tibial  pulses are palpable  bilaterally.  Capillary return is within normal limits  bilaterally. Temperature is within normal limits  bilaterally.  Neurologic  Senn-Weinstein monofilament wire test diminished   bilaterally. Muscle power within normal limits bilaterally.  Nails Thick disfigured discolored nails with subungual debris  from hallux to fifth toes bilaterally. No evidence of bacterial infection or drainage bilaterally.  Orthopedic  No limitations of motion  feet .  No crepitus or effusions noted.  No bony pathology or digital deformities noted.  Skin  normotropic skin with no porokeratosis noted bilaterally.  No signs of infections or ulcers noted.     Onychomycosis  Pain in right toes  Pain in left toes  Consent was obtained for treatment procedures.   Mechanical debridement of nails 1-5  bilaterally performed with a nail nipper.  Filed with dremel without incident.    Return office visit   3 months                   Told patient to return for periodic foot care and evaluation due to potential at risk complications.   Demarco Bacci DPM   

## 2021-06-23 ENCOUNTER — Encounter: Payer: Self-pay | Admitting: Internal Medicine

## 2021-06-23 ENCOUNTER — Ambulatory Visit (INDEPENDENT_AMBULATORY_CARE_PROVIDER_SITE_OTHER): Payer: Medicare HMO | Admitting: Internal Medicine

## 2021-06-23 ENCOUNTER — Other Ambulatory Visit: Payer: Self-pay

## 2021-06-23 VITALS — BP 122/70 | HR 63 | Temp 98.3°F | Ht 64.0 in | Wt 250.2 lb

## 2021-06-23 DIAGNOSIS — N3281 Overactive bladder: Secondary | ICD-10-CM

## 2021-06-23 DIAGNOSIS — N390 Urinary tract infection, site not specified: Secondary | ICD-10-CM

## 2021-06-23 NOTE — Patient Instructions (Addendum)
Consider gemtesa for overactive bladder   Urinary Incontinence Urinary incontinence refers to a condition in which a person is unable to control where and when to pass urine. A person with this condition will urinate involuntarily. This means that the person urinates when he or she does not mean to. What are the causes? This condition may be caused by: Medicines. Infections. Constipation. Overactive bladder muscles. Weak bladder muscles. Weak pelvic floor muscles. These muscles provide support for the bladder, intestine, and, in women, the uterus. Enlarged prostate in men. The prostate is a gland near the bladder. When it gets too big, it can pinch the urethra. With the urethra blocked, the bladder can weaken and lose the ability to empty properly. Surgery. Emotional factors, such as anxiety, stress, or post-traumatic stress disorder (PTSD). Spinal cord injury, nerve injury, or other neurological conditions. Pelvic organ prolapse. This happens in women when organs move out of place and into the vagina. This movement can prevent the bladder and urethra from working properly. What increases the risk? The following factors may make you more likely to develop this condition: Age. The older you are, the higher the risk. Obesity. Being physically inactive. Pregnancy and childbirth. Menopause. Diseases that affect the nerves or spinal cord. Long-term, or chronic, coughing. This can increase pressure on the bladder and pelvic floor muscles. What are the signs or symptoms? Symptoms may vary depending on the type of urinary incontinence you have. They include: A sudden urge to urinate, and passing urine involuntarily before you can get to a bathroom (urge incontinence). Suddenly passing urine when doing activities that force urine to pass, such as coughing, laughing, exercising, or sneezing (stress incontinence). Needing to urinate often but urinating only a small amount, or constantly dribbling  urine (overflow incontinence). Urinating because you cannot get to the bathroom in time due to a physical disability, such as arthritis or injury, or due to a communication or thinking problem, such as Alzheimer's disease (functional incontinence). How is this diagnosed? This condition may be diagnosed based on: Your medical history. A physical exam. Tests, such as: Urine tests. X-rays of your kidney and bladder. Ultrasound. CT scan. Cystoscopy. In this procedure, a health care provider inserts a tube with a light and camera (cystoscope) through the urethra and into the bladder to check for problems. Urodynamic testing. These tests assess how well the bladder, urethra, and sphincter can store and release urine. There are different types of urodynamic tests, and they vary depending on what the test is measuring. To help diagnose your condition, your health care provider may recommend that you keep a log of when you urinate and how much you urinate. How is this treated? Treatment for this condition depends on the type of incontinence that you have and its cause. Treatment may include: Lifestyle changes, such as: Quitting smoking. Maintaining a healthy weight. Staying active. Try to get 150 minutes of moderate-intensity exercise every week. Ask your health care provider which activities are safe for you. Eating a healthy diet. Avoid high-fat foods, like fried foods. Avoid refined carbohydrates like white bread and white rice. Limit how much alcohol and caffeine you drink. Increase your fiber intake. Healthy sources of fiber include beans, whole grains, and fresh fruits and vegetables. Behavioral changes, such as: Pelvic floor muscle exercises. Bladder training, such as lengthening the amount of time between bathroom breaks, or using the bathroom at regular intervals. Using techniques to suppress bladder urges. This can include distraction techniques or controlled breathing  exercises. Medicines,  such as: Medicines to relax the bladder muscles and prevent bladder spasms. Medicines to help slow or prevent the growth of a man's prostate. Botox injections. These can help relax the bladder muscles. Treatments, such as: Using pulses of electricity to help change bladder reflexes (electrical nerve stimulation). For women, using a medical device to prevent urine leaks. This is a small, tampon-like, disposable device that is inserted into the urethra. Injecting collagen or carbon beads (bulking agents) into the urinary sphincter. These can help thicken tissue and close the bladder opening. Surgery. Follow these instructions at home: Lifestyle Limit alcohol and caffeine. These can fill your bladder quickly and irritate it. Keep yourself clean to help prevent odors and skin damage. Ask your health care provider about special skin creams and cleansers that can protect the skin from urine. Consider wearing pads or adult diapers. Make sure to change them regularly, and always change them right after experiencing incontinence. General instructions Take over-the-counter and prescription medicines only as told by your health care provider. Use the bathroom about every 3-4 hours, even if you do not feel the need to urinate. Try to empty your bladder completely every time. After urinating, wait a minute. Then try to urinate again. Make sure you are in a relaxed position while urinating. If your incontinence is caused by nerve problems, keep a log of the medicines you take and the times you go to the bathroom. Keep all follow-up visits. This is important. Where to find more information [employer] of Diabetes and Digestive and Kidney Diseases: [employer] Association: www.urologyhealth.org Contact a health care provider if: You have pain that gets worse. Your incontinence gets worse. Get help right away if: You have a fever or chills. You are  unable to urinate. You have redness in your groin area or down your legs. Summary Urinary incontinence refers to a condition in which a person is unable to control where and when to pass urine. This condition may be caused by medicines, infection, weak bladder muscles, weak pelvic floor muscles, enlargement of the prostate (in men), or surgery. Factors such as older age, obesity, pregnancy and childbirth, menopause, neurological diseases, and chronic coughing may increase your risk for developing this condition. Types of urinary incontinence include urge incontinence, stress incontinence, overflow incontinence, and functional incontinence. This condition is usually treated first with lifestyle and behavioral changes, such as quitting smoking, eating a healthier diet, and doing regular pelvic floor exercises. Other treatment options include medicines, bulking agents, medical devices, electrical nerve stimulation, or surgery. This information is not intended to replace advice given to you by your health care provider. Make sure you discuss any questions you have with your health care provider. Document Revised: 12/26/2019 Document Reviewed: 12/26/2019 Elsevier Patient Education  2022 [place] Oral Tablets What is this medication? VIBEGRON (vye BEG ron) is used to treat overactive bladder. This medicine reduces the amount of bathroom visits. It may also help to control wetting accidents. This medicine may be used for other purposes; ask your health care provider or pharmacist if you have questions. COMMON BRAND NAME(S): GEMTESA What should I tell my care team before I take this medication? They need to know if you have any of these conditions: kidney disease liver disease prostate disease trouble passing urine an unusual or allergic reaction to vibegron, other medicines, foods, dyes, or preservatives pregnant or trying to get pregnant breast-feeding How should I use this  medication? Take this medicine by mouth with water.  Take it as directed on the prescription label at the same time every day. Swallow the tablets whole. You can take it with or without food. If it upsets your stomach, take it with food. You may also crush the tablet and put the contents in 1 tablespoon (15 mL) of applesauce. Swallow the medicine and applesauce right away. Take your medicine at regular intervals. Do not take it more often than directed. Keep taking it unless your health care provider tells you to stop. Talk to your pediatrician regarding the use of this medicine in children. Special care may be needed. Overdosage: If you think you have taken too much of this medicine contact a poison control center or emergency room at once. NOTE: This medicine is only for you. Do not share this medicine with others. What if I miss a dose? If you miss a dose, take it as soon as you can. If it is almost time for your next dose, take only that dose. Do not take double or extra doses. What may interact with this medication? digoxin This list may not describe all possible interactions. Give your health care provider a list of all the medicines, herbs, non-prescription drugs, or dietary supplements you use. Also tell them if you smoke, drink alcohol, or use illegal drugs. Some items may interact with your medicine. What should I watch for while using this medication? Visit your health care provider for regular checks on your progress. Tell your health care provider if your symptoms do not start to get better or if they get worse. You may need to limit your intake of tea, coffee, caffeinated sodas, or alcohol. These drinks may make your symptoms worse. What side effects may I notice from receiving this medication? Side effects that you should report to your doctor or health care professional as soon as possible: allergic reactions like skin rash, itching or hives, swelling of the face, lips, or  tongue infection (fever, chills, cough, sore throat, pain or trouble passing urine) trouble passing urine or change in the amount of urine Side effects that usually do not require medical attention (report these to your doctor or health care professional if they continue or are bothersome): constipation diarrhea dry mouth headache nasal congestion (runny or stuffy nose) nausea sore throat This list may not describe all possible side effects. Call your doctor for medical advice about side effects. You may report side effects to FDA at 1-800-FDA-1088. Where should I keep my medication? Keep out of the reach of children and pets. Store at room temperature between 20 and 25 degrees C (68 and 77 degrees F). NOTE: This sheet is a summary. It may not cover all possible information. If you have questions about this medicine, talk to your doctor, pharmacist, or health care provider.  2022 Elsevier/Gold Standard (2021-02-08 00:00:00)

## 2021-06-23 NOTE — Progress Notes (Signed)
Chief Complaint  Patient presents with   frequent urination    F/u  C/o increased freq urination and b/l flank pain (h/o arthritis in low back)on and off will have to pee every 30 minutes to 1 hour new with h/o UTI   Review of Systems  Constitutional:  Negative for weight loss.  HENT:  Negative for hearing loss.   Eyes:  Negative for blurred vision.  Respiratory:  Negative for shortness of breath.   Cardiovascular:  Negative for chest pain.  Gastrointestinal:  Negative for abdominal pain and blood in stool.  Genitourinary:  Positive for frequency. Negative for dysuria.  Musculoskeletal:  Negative for falls and joint pain.  Skin:  Negative for rash.  Neurological:  Negative for headaches.  Psychiatric/Behavioral:  Negative for depression.   Past Medical History:  Diagnosis Date   Arthritis    Diabetes ([place]) 02/19/2015   Diverticulosis 12/07/2016   Sigmoid Colon   Diverticulosis of sigmoid colon 12/08/2016   Elevated blood sugar 01/15/2015   GERD (gastroesophageal reflux disease)    Hemorrhoids    History of blood transfusion    Hyperlipidemia    Hypertension 01/15/2015   Obesity 01/15/2015   OSA on CPAP    Past Surgical History:  Procedure Laterality Date   BOTOX INJECTION N/A 03/27/2018   Procedure: BOTOX INJECTION;  Surgeon: Jules Husbands, MD;  Location: [facility];  Service: General;  Laterality: N/A;   CATARACT EXTRACTION W/PHACO Left 05/31/2020   Procedure: CATARACT EXTRACTION PHACO AND INTRAOCULAR LENS PLACEMENT ([place]) LEFT;  Surgeon: Eulogio Bear, MD;  Location: [place];  Service: Ophthalmology;  Laterality: Left;  4.16 0:40.4   CATARACT EXTRACTION W/PHACO Right 07/05/2020   Procedure: CATARACT EXTRACTION PHACO AND INTRAOCULAR LENS PLACEMENT ([facility]) RIGHT DIABETIC 3.20 00:26.3;  Surgeon: Eulogio Bear, MD;  Location: [place];  Service: Ophthalmology;  Laterality: Right;  Diabetic - oral meds   COLONOSCOPY     COLONOSCOPY WITH PROPOFOL N/A  12/07/2016   Procedure: COLONOSCOPY WITH PROPOFOL;  Surgeon: [name], MD;  Location: [place];  Service: Endoscopy;  Laterality: N/A;  Diabetic - oral meds   EVALUATION UNDER ANESTHESIA WITH ANAL FISSUROTOMY N/A 03/27/2018   Procedure: EXAM UNDER ANESTHESIA, CHEMICAL SPHINCTEROTOMY;  Surgeon: Jules Husbands, MD;  Location: [facility];  Service: General;  Laterality: N/A;   HEMORRHOID SURGERY N/A 06/13/2018   Procedure: [id number];  Surgeon: Jules Husbands, MD;  Location: [facility];  Service: General;  Laterality: N/A;   JOINT REPLACEMENT     b/l hips in [place] 2004 and 2005 Dr. Alfonso Ramus    POLYPECTOMY  12/07/2016   Procedure: POLYPECTOMY;  Surgeon: [name], MD;  Location: [place];  Service: Endoscopy;;   RECTAL EXAM UNDER ANESTHESIA N/A 06/13/2018   Procedure: RECTAL EXAM UNDER ANESTHESIA;  Surgeon: Jules Husbands, MD;  Location: [facility];  Service: General;  Laterality: N/A;   TOTAL HIP ARTHROPLASTY Bilateral    TUBAL LIGATION     Family History  Problem Relation Age of Onset   Dementia Mother    Hypertension Mother    Arthritis Mother    Alcohol abuse Father    Cirrhosis Father    Cancer Daughter        breast   COPD Brother    Breast cancer Sister 35   Cancer Sister        ? type    Dementia Sister    Cancer Brother        ?  type   Cancer Brother        cancer ?type   Cancer Daughter        cancer ?type died 25    Alzheimer's disease Sister    Social History   Socioeconomic History   Marital status: Divorced    Spouse name: Not on file   Number of children: Not on file   Years of education: Not on file   Highest education level: Not on file  Occupational History   Occupation: Astronomer covers    Comment: retired  Tobacco Use   Smoking status: Never   Smokeless tobacco: Never   Tobacco comments:    Never   Scientific laboratory technician Use: Never used  Substance and Sexual Activity   Alcohol use: No     Alcohol/week: 0.0 standard drinks   Drug use: No   Sexual activity: Not Currently  Other Topics Concern   Not on file  Social History Narrative   Lives with daughter [name] to go to church    Moved from [place] 3-4 years ago    No guns, wears seat belt    Never smoker    Social Determinants of Radio broadcast assistant Strain: Low Risk    Difficulty of Paying Living Expenses: Not hard at all  Food Insecurity: No Food Insecurity   Worried About Charity fundraiser in the Last Year: Never true   Arboriculturist in the Last Year: Never true  Transportation Needs: No Transportation Needs   Lack of Transportation (Medical): No   Lack of Transportation (Non-Medical): No  Physical Activity: Not on file  Stress: No Stress Concern Present   Feeling of Stress : Not at all  Social Connections: Unknown   Frequency of Communication with Friends and Family: More than three times a week   Frequency of Social Gatherings with Friends and Family: Once a week   Attends Religious Services: Not on Electrical engineer or Organizations: Not on file   Attends Archivist Meetings: Not on file   Marital Status: Not on file  Intimate Partner Violence: Not At Risk   Fear of Current or Ex-Partner: No   Emotionally Abused: No   Physically Abused: No   Sexually Abused: No   Current Meds  Medication Sig   amLODipine (NORVASC) 10 MG tablet Take 1 tablet (10 mg total) by mouth daily.   atorvastatin (LIPITOR) 10 MG tablet Take 1 tablet (10 mg total) by mouth daily at 6 PM.   Blood Glucose Calibration (TRUE METRIX LEVEL 2) Normal SOLN    blood glucose meter kit and supplies Dispense based on patient and insurance preference. Use up to four times daily as directed. (FOR ICD-10 E10.9, E11.9).   Calcium Carb-Cholecalciferol 600-800 MG-UNIT TABS Take 1 tablet by mouth 2 (two) times daily.   carvedilol (COREG) 25 MG tablet Take 1 tablet (25 mg total) by mouth 2 (two) times  daily with a meal.   docusate sodium (COLACE) 100 MG capsule Take 100 mg by mouth 2 (two) times daily as needed.   hydrALAZINE (APRESOLINE) 10 MG tablet Take 1 tablet (10 mg total) by mouth in the morning and at bedtime.   levothyroxine (SYNTHROID) 75 MCG tablet Take 1 tablet (75 mcg total) by mouth daily before breakfast. 30 minutes before food   losartan-hydrochlorothiazide (HYZAAR) 100-25 MG tablet Take 1 tablet by mouth daily. In  am   metFORMIN (GLUCOPHAGE) 500 MG tablet 1 pill daily in the am with food   Omega-3 Fatty Acids (FISH OIL) 1000 MG CAPS Take 1,000 mg by mouth 2 (two) times daily.    omeprazole (PRILOSEC) 20 MG capsule Take 1 capsule (20 mg total) by mouth daily. 30 min before food   TRUE METRIX BLOOD GLUCOSE TEST test strip    TRUEplus Lancets 33G MISC    No Known Allergies Recent Results (from the past 2160 hour(s))  Microalbumin / creatinine urine ratio     Status: Abnormal   Collection Time: 05/03/21  8:39 AM  Result Value Ref Range   Creatinine, Urine 6 (L) 20 - 275 mg/dL   Microalb, Ur 1.0 mg/dL    Comment: Reference Range Not established    Microalb Creat Ratio 167 (H) <30 mcg/mg creat    Comment: . The ADA defines abnormalities in albumin excretion as follows: Marland Kitchen Albuminuria Category        Result (mcg/mg creatinine) . Normal to Mildly increased   <30 Moderately increased         30-299  Severely increased           > OR = 300 . The ADA recommends that at least two of three specimens collected within a 3-6 month period be abnormal before considering a patient to be within a diagnostic category.   Lipid panel     Status: None   Collection Time: 05/03/21  8:39 AM  Result Value Ref Range   Cholesterol 154 0 - 200 mg/dL    Comment: ATP III Classification       Desirable:  < 200 mg/dL               Borderline High:  200 - 239 mg/dL          High:  > = 240 mg/dL   Triglycerides 60.0 0.0 - 149.0 mg/dL    Comment: Normal:  <150 mg/dLBorderline High:  150 - 199  mg/dL   HDL 65.00 >39.00 mg/dL   VLDL 12.0 0.0 - 40.0 mg/dL   LDL Cholesterol 77 0 - 99 mg/dL   Total CHOL/HDL Ratio 2     Comment:                Men          Women1/2 Average Risk     3.4          3.3Average Risk          5.0          4.42X Average Risk          9.6          7.13X Average Risk          15.0          11.0                       NonHDL 89.45     Comment: NOTE:  Non-HDL goal should be 30 mg/dL higher than patient's LDL goal (i.e. LDL goal of < 70 mg/dL, would have non-HDL goal of < 100 mg/dL)  Comprehensive metabolic panel     Status: Abnormal   Collection Time: 05/03/21  8:39 AM  Result Value Ref Range   Sodium 142 135 - 145 mEq/L   Potassium 4.2 3.5 - 5.1 mEq/L   Chloride 102 96 - 112 mEq/L   CO2 33 (H) 19 - 32 mEq/L  Glucose, Bld 113 (H) 70 - 99 mg/dL   BUN 11 6 - 23 mg/dL   Creatinine, Ser 0.72 0.40 - 1.20 mg/dL   Total Bilirubin 0.7 0.2 - 1.2 mg/dL   Alkaline Phosphatase 63 39 - 117 U/L   AST 13 0 - 37 U/L   ALT 8 0 - 35 U/L   Total Protein 7.0 6.0 - 8.3 g/dL   Albumin 4.0 3.5 - 5.2 g/dL   GFR 79.67 >60.00 mL/min    Comment: Calculated using the CKD-EPI Creatinine Equation (2021)   Calcium 9.6 8.4 - 10.5 mg/dL  CBC with Differential/Platelet     Status: None   Collection Time: 05/03/21  8:39 AM  Result Value Ref Range   WBC 7.9 4.0 - 10.5 K/uL   RBC 4.71 3.87 - 5.11 Mil/uL   Hemoglobin 12.2 12.0 - 15.0 g/dL   HCT 38.4 36.0 - 46.0 %   MCV 81.6 78.0 - 100.0 fl   MCHC 31.8 30.0 - 36.0 g/dL   RDW 14.4 11.5 - 15.5 %   Platelets 232.0 150.0 - 400.0 K/uL   Neutrophils Relative % 58.5 43.0 - 77.0 %   Lymphocytes Relative 30.0 12.0 - 46.0 %   Monocytes Relative 6.9 3.0 - 12.0 %   Eosinophils Relative 3.8 0.0 - 5.0 %   Basophils Relative 0.8 0.0 - 3.0 %   Neutro Abs 4.6 1.4 - 7.7 K/uL   Lymphs Abs 2.4 0.7 - 4.0 K/uL   Monocytes Absolute 0.5 0.1 - 1.0 K/uL   Eosinophils Absolute 0.3 0.0 - 0.7 K/uL   Basophils Absolute 0.1 0.0 - 0.1 K/uL  Hemoglobin A1c      Status: None   Collection Time: 05/03/21  8:39 AM  Result Value Ref Range   Hgb A1c MFr Bld 6.4 4.6 - 6.5 %    Comment: Glycemic Control Guidelines for People with Diabetes:Non Diabetic:  <6%Goal of Therapy: <7%Additional Action Suggested:  >8%   TSH     Status: None   Collection Time: 05/03/21  8:39 AM  Result Value Ref Range   TSH 3.52 0.35 - 5.50 uIU/mL   Objective  Body mass index is 42.95 kg/m. Wt Readings from Last 3 Encounters:  06/23/21 250 lb 3.2 oz (113.5 kg)  05/09/21 254 lb (115.2 kg)  05/03/21 254 lb 6.4 oz (115.4 kg)   Temp Readings from Last 3 Encounters:  06/23/21 98.3 F (36.8 C) (Oral)  05/03/21 (!) 97 F (36.1 C) (Temporal)  01/04/21 98.3 F (36.8 C) (Oral)   BP Readings from Last 3 Encounters:  06/23/21 122/70  05/03/21 132/82  03/08/21 124/68   Pulse Readings from Last 3 Encounters:  06/23/21 63  05/03/21 64  03/08/21 (!) 58    Physical Exam Vitals and nursing note reviewed.  Constitutional:      Appearance: Normal appearance. She is well-developed and well-groomed.  HENT:     Head: Normocephalic and atraumatic.  Eyes:     Conjunctiva/sclera: Conjunctivae normal.     Pupils: Pupils are equal, round, and reactive to light.  Cardiovascular:     Rate and Rhythm: Normal rate and regular rhythm.     Heart sounds: Normal heart sounds. No murmur heard. Pulmonary:     Effort: Pulmonary effort is normal.     Breath sounds: Normal breath sounds.  Abdominal:     General: Abdomen is flat. Bowel sounds are normal.     Tenderness: There is no abdominal tenderness.  Musculoskeletal:  General: No tenderness.  Skin:    General: Skin is warm and dry.  Neurological:     General: No focal deficit present.     Mental Status: She is alert and oriented to person, place, and time. Mental status is at baseline.     Cranial Nerves: Cranial nerves 2-12 are intact.     Gait: Gait is intact.  Psychiatric:        Attention and Perception: Attention and  perception normal.        Mood and Affect: Mood and affect normal.        Speech: Speech normal.        Behavior: Behavior normal. Behavior is cooperative.        Thought Content: Thought content normal.        Cognition and Memory: Cognition and memory normal.        Judgment: Judgment normal.    Assessment  Plan  Overactive bladder - Plan: Urinalysis, Routine w reflex microscopic, Urine Culture  Urinary tract infection without hematuria, site unspecified  Consider gemtasa in the future   HM  See previous note Provider: Dr. Olivia Mackie McLean-Scocuzza-Internal Medicine

## 2021-06-25 LAB — URINALYSIS, ROUTINE W REFLEX MICROSCOPIC
Bilirubin Urine: NEGATIVE
Glucose, UA: NEGATIVE
Hyaline Cast: NONE SEEN /LPF
Ketones, ur: NEGATIVE
Nitrite: NEGATIVE
Protein, ur: NEGATIVE
RBC / HPF: NONE SEEN /HPF (ref 0–2)
Specific Gravity, Urine: 1.003 (ref 1.001–1.035)
Squamous Epithelial / HPF: NONE SEEN /HPF
pH: 7.5 (ref 5.0–8.0)

## 2021-06-25 LAB — URINE CULTURE
MICRO NUMBER:: 12892444
SPECIMEN QUALITY:: ADEQUATE

## 2021-06-27 ENCOUNTER — Other Ambulatory Visit: Payer: Self-pay | Admitting: Internal Medicine

## 2021-06-27 ENCOUNTER — Encounter: Payer: Self-pay | Admitting: Internal Medicine

## 2021-06-27 DIAGNOSIS — N3000 Acute cystitis without hematuria: Secondary | ICD-10-CM

## 2021-06-27 MED ORDER — CIPROFLOXACIN HCL 500 MG PO TABS: 500.0000 mg | ORAL_TABLET | Freq: Two times a day (BID) | ORAL | 0 refills | Status: AC

## 2021-06-28 ENCOUNTER — Telehealth: Payer: Self-pay

## 2021-06-28 NOTE — Telephone Encounter (Signed)
LMTCB in regards to lab results.  

## 2021-06-28 NOTE — Telephone Encounter (Signed)
Patient called in, She is returning a call received , I transfer call to [place].

## 2021-06-29 ENCOUNTER — Telehealth: Payer: Self-pay | Admitting: Internal Medicine

## 2021-06-29 DIAGNOSIS — G4733 Obstructive sleep apnea (adult) (pediatric): Secondary | ICD-10-CM

## 2021-06-29 NOTE — Telephone Encounter (Signed)
Called and verified with the Patient that she is needing a new CPAP machine. Informed the Patient that she will need to contact her pulmonologist Dr Mortimer Fries to get an order for a new Cpap machine.   Patient verbalized understanding and given the number to call, [phone].

## 2021-06-29 NOTE — Telephone Encounter (Signed)
Order placed 01/2020 for replacement machine. Patient stated that she did not receive machine.  Her current machine has exceeded motor life.   Dr. Mortimer Fries, please advise if okay to place an order? Thanks

## 2021-06-29 NOTE — Telephone Encounter (Signed)
Pt called in stating that [facility] need a form sent to them to repair her zpac machine. Pt motor is going out and they want fix it without the form.

## 2021-06-30 NOTE — Telephone Encounter (Signed)
Order placed to [facility] for replacement machine.  Patient is aware and voiced his understanding.  Nothing further needed at this time.

## 2021-08-16 DIAGNOSIS — E119 Type 2 diabetes mellitus without complications: Secondary | ICD-10-CM | POA: Diagnosis not present

## 2021-08-16 DIAGNOSIS — Z0100 Encounter for examination of eyes and vision without abnormal findings: Secondary | ICD-10-CM | POA: Diagnosis not present

## 2021-08-25 ENCOUNTER — Other Ambulatory Visit: Payer: Self-pay

## 2021-08-25 ENCOUNTER — Encounter: Payer: Self-pay | Admitting: Podiatry

## 2021-08-25 ENCOUNTER — Ambulatory Visit: Payer: Medicare HMO | Admitting: Podiatry

## 2021-08-25 DIAGNOSIS — E119 Type 2 diabetes mellitus without complications: Secondary | ICD-10-CM | POA: Diagnosis not present

## 2021-08-25 DIAGNOSIS — M79674 Pain in right toe(s): Secondary | ICD-10-CM | POA: Diagnosis not present

## 2021-08-25 DIAGNOSIS — B351 Tinea unguium: Secondary | ICD-10-CM

## 2021-08-25 DIAGNOSIS — M79675 Pain in left toe(s): Secondary | ICD-10-CM

## 2021-08-25 NOTE — Progress Notes (Signed)
This patient returns to my office for at risk foot care.  This patient requires this care by a professional since this patient will be at risk due to having diabetic neuropathy.  This patient is unable to cut nails herself since the patient cannot reach hernails.These nails are painful walking and wearing shoes.  This patient presents for at risk foot care today.  General Appearance  Alert, conversant and in no acute stress.  Vascular  Dorsalis pedis and posterior tibial  pulses are palpable  bilaterally.  Capillary return is within normal limits  bilaterally. Temperature is within normal limits  bilaterally.  Neurologic  Senn-Weinstein monofilament wire test diminished   bilaterally. Muscle power within normal limits bilaterally.  Nails Thick disfigured discolored nails with subungual debris  from hallux to fifth toes bilaterally. No evidence of bacterial infection or drainage bilaterally.  Orthopedic  No limitations of motion  feet .  No crepitus or effusions noted.  No bony pathology or digital deformities noted.  Skin  normotropic skin with no porokeratosis noted bilaterally.  No signs of infections or ulcers noted.     Onychomycosis  Pain in right toes  Pain in left toes  Consent was obtained for treatment procedures.   Mechanical debridement of nails 1-5  bilaterally performed with a nail nipper.  Filed with dremel without incident.    Return office visit   3 months                   Told patient to return for periodic foot care and evaluation due to potential at risk complications.   Abbiegail Landgren DPM   

## 2021-09-08 ENCOUNTER — Ambulatory Visit
Admission: RE | Admit: 2021-09-08 | Discharge: 2021-09-08 | Disposition: A | Discharge status: Home / Self Care | Payer: Medicare HMO | Source: Ambulatory Visit | Attending: Internal Medicine | Admitting: Internal Medicine

## 2021-09-08 DIAGNOSIS — Z1231 Encounter for screening mammogram for malignant neoplasm of breast: Secondary | ICD-10-CM | POA: Diagnosis not present

## 2021-09-16 LAB — HM DIABETES EYE EXAM

## 2021-09-20 ENCOUNTER — Encounter: Payer: Self-pay | Admitting: Internal Medicine

## 2021-10-20 ENCOUNTER — Ambulatory Visit (INDEPENDENT_AMBULATORY_CARE_PROVIDER_SITE_OTHER): Payer: Medicare HMO | Admitting: Adult Health

## 2021-10-20 ENCOUNTER — Telehealth: Payer: Self-pay | Admitting: Adult Health

## 2021-10-20 ENCOUNTER — Encounter: Payer: Self-pay | Admitting: Adult Health

## 2021-10-20 DIAGNOSIS — Z6841 Body Mass Index (BMI) 40.0 and over, adult: Secondary | ICD-10-CM | POA: Diagnosis not present

## 2021-10-20 DIAGNOSIS — G4733 Obstructive sleep apnea (adult) (pediatric): Secondary | ICD-10-CM | POA: Diagnosis not present

## 2021-10-20 DIAGNOSIS — Z9989 Dependence on other enabling machines and devices: Secondary | ICD-10-CM

## 2021-10-20 NOTE — Patient Instructions (Signed)
Keep up good work  CPAP download Order for new CPAP  Work on healthy weight loss Do not drive if sleepy  Follow up in 1 year with Dr. Mortimer Fries and As needed

## 2021-10-20 NOTE — Progress Notes (Signed)
@Patient  ID: Taylor Shaw, female    DOB: 09-08-1940, 81 y.o.   MRN: [id number]  Chief Complaint  Patient presents with   Follow-up    Referring provider: McLean-Scocuzza, Olivia Mackie *  HPI: 81 year old female followed for obstructive sleep apnea on nocturnal CPAP  TEST/EVENTS :  CPAP download data 01/10/2018-04/09/2018.>>  Raw data personally reviewed uses greater than 4 hours is 86/90 days.  Average usage on days used 7 hours 20 minutes.  Set pressure is 16 CPAP.  Leaks are minimal.  Residual AHI 0.6.  Overall this shows very good compliance with CPAP with excellent control of obstructive sleep apnea. **CPAP download 09/11/2018- 12/09/2018>> raw data personally reviewed.  Usage greater than 4 hours 85/90 days.  Average usage on days used is 8 hours 31 minutes.  Set pressure 16, leaks are within normal limits, residual AHI 0.4.  Overall this shows very good compliance with CPAP with excellent control obstructive sleep apnea.   Review of outside sleep studies and outside records including office visit notes from neuro and sleep clinic of [place] received and reviewed, 32 minutes spent, summarized below. **CPAP titration study 01/25/2011 CPAP titrated to 16. **Baseline PSG 12/14/2010: Severe obstructive sleep apnea with AHI of 39.  CPAP titration was recommended.  10/20/2021 Follow up : OSA  Patient presents for 9-monthfollow-up.  Patient has underlying severe obstructive sleep apnea.  Patient is on CPAP.  Patient says she wears her CPAP every single night usually gets anywhere from 8 to 10 hours.  She says she cannot sleep without it.  She feels rested and feels that she benefits from CPAP.  Patient says that she needs a new CPAP machine.  Her CPAP is currently given her message that she is exceeded the motor life of her machine.  CPAP download was requested today.  Was unable to download remotely.  Her homecare company has been contacted.   No Known Allergies  Immunization History   Administered Date(s) Administered   Fluad Quad(high Dose 65+) 02/04/2019, 05/27/2020, 05/03/2021   Influenza, High Dose Seasonal PF 02/19/2015, 04/03/2018   Influenza-Unspecified 03/16/2014, 02/27/2017   PFIZER(Purple Top)SARS-COV-2 Vaccination 07/09/2019, 07/30/2019   Pfizer Covid-19 Vaccine Bivalent Booster 124yr& up 01/07/2021   Pneumococcal Conjugate-13 07/04/2018   Pneumococcal-Unspecified 03/16/2014   Tdap 07/04/2018, 03/28/2019   Zoster Recombinat (Shingrix) 07/04/2018, 03/10/2019, 03/28/2019   Zoster, Live 04/16/2013    Past Medical History:  Diagnosis Date   Arthritis    [place]   Diverticulosis 12/07/2016   Sigmoid Colon   Diverticulosis of sigmoid colon 12/08/2016   Elevated blood sugar 01/15/2015   GERD (gastroesophageal reflux disease)    Hemorrhoids    History of blood transfusion    Hyperlipidemia    Hypertension 01/15/2015   Obesity 01/15/2015   OSA on CPAP    UTI (urinary tract infection)     Tobacco History: Social History   Tobacco Use  Smoking Status Never  Smokeless Tobacco Never  Tobacco Comments   Never    Counseling given: Not Answered Tobacco comments: Never    Outpatient Medications Prior to Visit  Medication Sig Dispense Refill   amLODipine (NORVASC) 10 MG tablet Take 1 tablet (10 mg total) by mouth daily. 90 tablet 3   atorvastatin (LIPITOR) 10 MG tablet Take 1 tablet (10 mg total) by mouth daily at 6 PM. 90 tablet 3   Blood Glucose Calibration (TRUE METRIX LEVEL 2) Normal SOLN      blood glucose meter kit and supplies  Dispense based on patient and insurance preference. Use up to four times daily as directed. (FOR ICD-10 E10.9, E11.9). 1 each 0   Calcium Carb-Cholecalciferol 600-800 MG-UNIT TABS Take 1 tablet by mouth 2 (two) times daily.     carvedilol (COREG) 25 MG tablet Take 1 tablet (25 mg total) by mouth 2 (two) times daily with a meal. 180 tablet 3   docusate sodium (COLACE) 100 MG capsule Take 100 mg by mouth  2 (two) times daily as needed.     hydrALAZINE (APRESOLINE) 10 MG tablet Take 1 tablet (10 mg total) by mouth in the morning and at bedtime. 180 tablet 3   levothyroxine (SYNTHROID) 75 MCG tablet Take 1 tablet (75 mcg total) by mouth daily before breakfast. 30 minutes before food 90 tablet 3   losartan-hydrochlorothiazide (HYZAAR) 100-25 MG tablet Take 1 tablet by mouth daily. In am 90 tablet 3   metFORMIN (GLUCOPHAGE) 500 MG tablet 1 pill daily in the am with food 90 tablet 3   Omega-3 Fatty Acids (FISH OIL) 1000 MG CAPS Take 1,000 mg by mouth 2 (two) times daily.      omeprazole (PRILOSEC) 20 MG capsule Take 1 capsule (20 mg total) by mouth daily. 30 min before food 90 capsule 3   TRUE METRIX BLOOD GLUCOSE TEST test strip      TRUEplus Lancets 33G MISC      No facility-administered medications prior to visit.     Review of Systems:   Constitutional:   No  weight loss, night sweats,  Fevers, chills, fatigue, or  lassitude.  HEENT:   No headaches,  Difficulty swallowing,  Tooth/dental problems, or  Sore throat,                No sneezing, itching, ear ache, nasal congestion, post nasal drip,   CV:  No chest pain,  Orthopnea, PND, swelling in lower extremities, anasarca, dizziness, palpitations, syncope.   GI  No heartburn, indigestion, abdominal pain, nausea, vomiting, diarrhea, change in bowel habits, loss of appetite, bloody stools.   Resp: No shortness of breath with exertion or at rest.  No excess mucus, no productive cough,  No non-productive cough,  No coughing up of blood.  No change in color of mucus.  No wheezing.  No chest wall deformity  Skin: no rash or lesions.  GU: no dysuria, change in color of urine, no urgency or frequency.  No flank pain, no hematuria   MS:  No joint pain or swelling.  No decreased range of motion.  No back pain.    Physical Exam  BP 126/80 (BP Location: Left Arm, Cuff Size: Normal)   Pulse 63   Temp (!) 97.5 F (36.4 C) (Temporal)   Ht 5'  4" (1.626 m)   Wt 243 lb 12.8 oz (110.6 kg)   SpO2 95%   BMI 41.85 kg/m   GEN: A/Ox3; pleasant , NAD, well nourished    HEENT:  [place]/AT,  , NOSE-clear, THROAT-clear, no lesions, no postnasal drip or exudate noted.  Class II-III MP airway  NECK:  Supple w/ fair ROM; no JVD; normal carotid impulses w/o bruits; no thyromegaly or nodules palpated; no lymphadenopathy.    RESP  Clear  P & A; w/o, wheezes/ rales/ or rhonchi. no accessory muscle use, no dullness to percussion  CARD:  RRR, no m/r/g, no peripheral edema, pulses intact, no cyanosis or clubbing.  GI:   Soft & nt; nml bowel sounds; no organomegaly or masses detected.  Musco: Warm bil, no deformities or joint swelling noted.   Neuro: alert, no focal deficits noted.    Skin: Warm, no lesions or rashes    Lab Results:  CBC   BMET   BNP No results found for: BNP  ProBNP No results found for: PROBNP  Imaging: No results found.        View : No data to display.          No results found for: NITRICOXIDE      Assessment & Plan:   OSA on CPAP Severe obstructive sleep apnea with excellent compliance on CPAP.  CPAP download has been requested.  Patient does need a new CPAP machine.  Order has been placed will continue on current settings.  Plan Patient Instructions  Keep up good work  CPAP download Order for new CPAP  Work on healthy weight loss Do not drive if sleepy  Follow up in 1 year with Dr. Mortimer Fries and As needed       Morbid obesity with BMI of 40.0-44.9, adult ([place]) Healthy weight loss discussed     Rexene Edison, NP 10/20/2021

## 2021-10-20 NOTE — Addendum Note (Signed)
Addended by: Claudette Head A on: 10/20/2021 12:14 PM   Modules accepted: Orders

## 2021-10-20 NOTE — Assessment & Plan Note (Signed)
Severe obstructive sleep apnea with excellent compliance on CPAP.  CPAP download has been requested.  Patient does need a new CPAP machine.  Order has been placed will continue on current settings.  Plan Patient Instructions  Keep up good work  CPAP download Order for new CPAP  Work on healthy weight loss Do not drive if sleepy  Follow up in 1 year with Dr. Mortimer Fries and As needed

## 2021-10-20 NOTE — Assessment & Plan Note (Signed)
Healthy weight loss discussed 

## 2021-10-20 NOTE — Telephone Encounter (Signed)
ATC [facility] to obtain cpap download. Placed on hold for >37mn. Airview date need to be updated, as data stops in 2020. Will call back.

## 2021-10-21 NOTE — Telephone Encounter (Signed)
Patient will need to take SD card to [facility] for download.  Spoke to patient's daughter, Nekita(DPR). She stated that she would have patient take card to [place] next week for download. Will hold message in triage to ensure f/u.

## 2021-10-26 NOTE — Telephone Encounter (Signed)
Spoke to patient's daughter, Nekita(DPR) for update. She stated that patient plans to take SD card to [facility] this week.

## 2021-11-02 NOTE — Telephone Encounter (Signed)
Spoke to patient's daughter, Nekita(DPR). She stated that patient took SD card to [facility] last week for download.  Spoke to Terri with [facility] and requested that she fax download.

## 2021-11-02 NOTE — Telephone Encounter (Signed)
Report received and faxed to Rexene Edison, NP for review.

## 2021-11-04 NOTE — Telephone Encounter (Signed)
Routing to [place] to keep an eye out for DL and give to TP to review

## 2021-11-04 NOTE — Telephone Encounter (Signed)
I looked thru al my paperwork no download

## 2021-11-04 NOTE — Telephone Encounter (Signed)
I have re faxed DL to [facility] office.

## 2021-11-04 NOTE — Telephone Encounter (Signed)
Margie- do you have a copy of this DL? Tammy says she has still not received

## 2021-11-08 NOTE — Telephone Encounter (Signed)
Taylor Shaw, did you receive DL?

## 2021-11-08 NOTE — Telephone Encounter (Signed)
Download received and placed in folder for Rexene Edison NP to review.

## 2021-11-09 ENCOUNTER — Other Ambulatory Visit: Payer: Self-pay | Admitting: *Deleted

## 2021-11-09 DIAGNOSIS — G4733 Obstructive sleep apnea (adult) (pediatric): Secondary | ICD-10-CM

## 2021-11-09 NOTE — Telephone Encounter (Signed)
Called and spoke with patient's daughter Austyn Perriello and advised her that we did receive the download from [place] from her mom's CPAP machine and that an order has been sent to them for a new CPAP.  She verbalized understanding.  Nothing further needed.

## 2021-11-09 NOTE — Telephone Encounter (Signed)
DL shows 100% compliance and control . AHI <1 .  Great job   Continue same setting  Order new CPAP please

## 2021-11-10 ENCOUNTER — Ambulatory Visit (INDEPENDENT_AMBULATORY_CARE_PROVIDER_SITE_OTHER): Payer: Medicare HMO | Admitting: Internal Medicine

## 2021-11-10 ENCOUNTER — Encounter: Payer: Self-pay | Admitting: Internal Medicine

## 2021-11-10 VITALS — BP 130/80 | HR 54 | Temp 98.1°F | Resp 14 | Ht 64.0 in | Wt 242.2 lb

## 2021-11-10 DIAGNOSIS — N390 Urinary tract infection, site not specified: Secondary | ICD-10-CM | POA: Diagnosis not present

## 2021-11-10 DIAGNOSIS — I152 Hypertension secondary to endocrine disorders: Secondary | ICD-10-CM

## 2021-11-10 DIAGNOSIS — E039 Hypothyroidism, unspecified: Secondary | ICD-10-CM | POA: Diagnosis not present

## 2021-11-10 DIAGNOSIS — Z1231 Encounter for screening mammogram for malignant neoplasm of breast: Secondary | ICD-10-CM | POA: Diagnosis not present

## 2021-11-10 DIAGNOSIS — E1159 Type 2 diabetes mellitus with other circulatory complications: Secondary | ICD-10-CM

## 2021-11-10 LAB — LIPID PANEL
Cholesterol: 156 mg/dL (ref 0–200)
HDL: 57.6 mg/dL (ref >39.00)
LDL Cholesterol: 85 mg/dL (ref 0–99)
NonHDL: 98.52
Total CHOL/HDL Ratio: 3
Triglycerides: 70 mg/dL (ref 0.0–149.0)
VLDL: 14 mg/dL (ref 0.0–40.0)

## 2021-11-10 LAB — COMPREHENSIVE METABOLIC PANEL
ALT: 6 U/L (ref 0–35)
AST: 11 U/L (ref 0–37)
Albumin: 4 g/dL (ref 3.5–5.2)
Alkaline Phosphatase: 60 U/L (ref 39–117)
BUN: 14 mg/dL (ref 6–23)
CO2: 33 mEq/L — ABNORMAL HIGH (ref 19–32)
Calcium: 9.6 mg/dL (ref 8.4–10.5)
Chloride: 99 mEq/L (ref 96–112)
Creatinine, Ser: 0.84 mg/dL (ref 0.40–1.20)
GFR: 65.97 mL/min (ref >60.00)
Glucose, Bld: 90 mg/dL (ref 70–99)
Potassium: 3.7 mEq/L (ref 3.5–5.1)
Sodium: 141 mEq/L (ref 135–145)
Total Bilirubin: 0.7 mg/dL (ref 0.2–1.2)
Total Protein: 6.9 g/dL (ref 6.0–8.3)

## 2021-11-10 LAB — CBC WITH DIFFERENTIAL/PLATELET
Basophils Absolute: 0.1 10*3/uL (ref 0.0–0.1)
Basophils Relative: 1 % (ref 0.0–3.0)
Eosinophils Absolute: 0.1 10*3/uL (ref 0.0–0.7)
Eosinophils Relative: 1.7 % (ref 0.0–5.0)
HCT: 37.7 % (ref 36.0–46.0)
Hemoglobin: 12.3 g/dL (ref 12.0–15.0)
Lymphocytes Relative: 32.7 % (ref 12.0–46.0)
Lymphs Abs: 2.5 10*3/uL (ref 0.7–4.0)
MCHC: 32.5 g/dL (ref 30.0–36.0)
MCV: 81 fl (ref 78.0–100.0)
Monocytes Absolute: 0.5 10*3/uL (ref 0.1–1.0)
Monocytes Relative: 6.5 % (ref 3.0–12.0)
Neutro Abs: 4.4 10*3/uL (ref 1.4–7.7)
Neutrophils Relative %: 58.1 % (ref 43.0–77.0)
Platelets: 236 10*3/uL (ref 150.0–400.0)
RBC: 4.65 Mil/uL (ref 3.87–5.11)
RDW: 14.3 % (ref 11.5–15.5)
WBC: 7.6 10*3/uL (ref 4.0–10.5)

## 2021-11-10 LAB — TSH: TSH: 4.98 u[IU]/mL (ref 0.35–5.50)

## 2021-11-10 LAB — HEMOGLOBIN A1C: Hgb A1c MFr Bld: 6.2 % (ref 4.6–6.5)

## 2021-11-10 NOTE — Patient Instructions (Addendum)
Bonine or Dramamine (chewable) over the counter I like Dramamine  Consider 4th covid vaccine in fall 2023 new vaccine is out  Dr. Volanda Napoleon will be her in 12/2021

## 2021-11-10 NOTE — Progress Notes (Addendum)
Chief Complaint  Patient presents with   Follow-up    Denies any concerns or pain   F/u  1. Htn controlled and dm 2 controlled norvasc 10 mg qd lipitor 10 mg qhs, coreg 25 mg bid, hydralazine 10 mg bid, hyzaar 100-25, metformin 500 mg qd     Review of Systems  Constitutional:  Negative for weight loss.  HENT:  Negative for hearing loss.   Eyes:  Negative for blurred vision.  Respiratory:  Negative for shortness of breath.   Cardiovascular:  Negative for chest pain.  Gastrointestinal:  Negative for abdominal pain and blood in stool.  Genitourinary:  Negative for dysuria.  Musculoskeletal:  Negative for falls and joint pain.  Skin:  Negative for rash.  Neurological:  Negative for headaches.  Psychiatric/Behavioral:  Negative for depression.    Past Medical History:  Diagnosis Date   Arthritis    Diabetes ([place]) 02/19/2015   Diverticulosis 12/07/2016   Sigmoid Colon   Diverticulosis of sigmoid colon 12/08/2016   Elevated blood sugar 01/15/2015   GERD (gastroesophageal reflux disease)    Hemorrhoids    History of blood transfusion    Hyperlipidemia    Hypertension 01/15/2015   Obesity 01/15/2015   OSA on CPAP    UTI (urinary tract infection)    Past Surgical History:  Procedure Laterality Date   BOTOX INJECTION N/A 03/27/2018   Procedure: BOTOX INJECTION;  Surgeon: Jules Husbands, MD;  Location: [facility];  Service: General;  Laterality: N/A;   CATARACT EXTRACTION W/PHACO Left 05/31/2020   Procedure: CATARACT EXTRACTION PHACO AND INTRAOCULAR LENS PLACEMENT ([place]) LEFT;  Surgeon: Eulogio Bear, MD;  Location: [place];  Service: Ophthalmology;  Laterality: Left;  4.16 0:40.4   CATARACT EXTRACTION W/PHACO Right 07/05/2020   Procedure: CATARACT EXTRACTION PHACO AND INTRAOCULAR LENS PLACEMENT ([facility]) RIGHT DIABETIC 3.20 00:26.3;  Surgeon: Eulogio Bear, MD;  Location: [place];  Service: Ophthalmology;  Laterality: Right;  Diabetic - oral meds    COLONOSCOPY     COLONOSCOPY WITH PROPOFOL N/A 12/07/2016   Procedure: COLONOSCOPY WITH PROPOFOL;  Surgeon: [name], MD;  Location: [place];  Service: Endoscopy;  Laterality: N/A;  Diabetic - oral meds   EVALUATION UNDER ANESTHESIA WITH ANAL FISSUROTOMY N/A 03/27/2018   Procedure: EXAM UNDER ANESTHESIA, CHEMICAL SPHINCTEROTOMY;  Surgeon: Jules Husbands, MD;  Location: [facility];  Service: General;  Laterality: N/A;   HEMORRHOID SURGERY N/A 06/13/2018   Procedure: [id number];  Surgeon: Jules Husbands, MD;  Location: [facility];  Service: General;  Laterality: N/A;   JOINT REPLACEMENT     b/l hips in [place] 2004 and 2005 Dr. Alfonso Ramus    POLYPECTOMY  12/07/2016   Procedure: POLYPECTOMY;  Surgeon: [name], MD;  Location: [place];  Service: Endoscopy;;   RECTAL EXAM UNDER ANESTHESIA N/A 06/13/2018   Procedure: RECTAL EXAM UNDER ANESTHESIA;  Surgeon: Jules Husbands, MD;  Location: [facility];  Service: General;  Laterality: N/A;   TOTAL HIP ARTHROPLASTY Bilateral    TUBAL LIGATION     Family History  Problem Relation Age of Onset   Dementia Mother    Hypertension Mother    Arthritis Mother    Alcohol abuse Father    Cirrhosis Father    Cancer Daughter        breast   COPD Brother    Breast cancer Sister 71   Cancer Sister        ? type  Dementia Sister    Cancer Brother        ?type   Cancer Brother        cancer ?type   Cancer Daughter        cancer ?type died 52    Alzheimer's disease Sister    Social History   Socioeconomic History   Marital status: Divorced    Spouse name: Not on file   Number of children: Not on file   Years of education: Not on file   Highest education level: Not on file  Occupational History   Occupation: Astronomer covers    Comment: retired  Tobacco Use   Smoking status: Never   Smokeless tobacco: Never   Tobacco comments:    Never   Scientific laboratory technician Use: Never used  Substance and  Sexual Activity   Alcohol use: No    Alcohol/week: 0.0 standard drinks of alcohol   Drug use: No   Sexual activity: Not Currently  Other Topics Concern   Not on file  Social History Narrative   Lives with daughter [name] to go to church    Moved from [place] 3-4 years ago    No guns, wears seat belt    Never smoker    Social Determinants of Health   Financial Resource Strain: Low Risk  (05/09/2021)   Overall Financial Resource Strain (CARDIA)    Difficulty of Paying Living Expenses: Not hard at all  Food Insecurity: No Food Insecurity (05/09/2021)   Hunger Vital Sign    Worried About Running Out of Food in the Last Year: Never true    [place] in the Last Year: Never true  Transportation Needs: No Transportation Needs (05/09/2021)   PRAPARE - Hydrologist (Medical): No    Lack of Transportation (Non-Medical): No  Physical Activity: Not on file  Stress: No Stress Concern Present (05/09/2021)   [place]    Feeling of Stress : Not at all  Social Connections: Unknown (05/09/2021)   Social Connection and Isolation Panel [NHANES]    Frequency of Communication with Friends and Family: More than three times a week    Frequency of Social Gatherings with Friends and Family: Once a week    Attends Religious Services: Not on Diplomatic Services operational officer of Clubs or Organizations: Not on file    Attends Archivist Meetings: Not on file    Marital Status: Not on file  Intimate Partner Violence: Not At Risk (05/09/2021)   Humiliation, Afraid, Rape, and Kick questionnaire    Fear of Current or Ex-Partner: No    Emotionally Abused: No    Physically Abused: No    Sexually Abused: No   Current Meds  Medication Sig   amLODipine (NORVASC) 10 MG tablet Take 1 tablet (10 mg total) by mouth daily.   atorvastatin (LIPITOR) 10 MG tablet Take 1 tablet (10 mg total) by mouth daily at  6 PM.   Blood Glucose Calibration (TRUE METRIX LEVEL 2) Normal SOLN    blood glucose meter kit and supplies Dispense based on patient and insurance preference. Use up to four times daily as directed. (FOR ICD-10 E10.9, E11.9).   Calcium Carb-Cholecalciferol 600-800 MG-UNIT TABS Take 1 tablet by mouth 2 (two) times daily.   carvedilol (COREG) 25 MG tablet Take 1 tablet (25 mg total) by mouth 2 (  two) times daily with a meal.   docusate sodium (COLACE) 100 MG capsule Take 100 mg by mouth 2 (two) times daily as needed.   hydrALAZINE (APRESOLINE) 10 MG tablet Take 1 tablet (10 mg total) by mouth in the morning and at bedtime.   levothyroxine (SYNTHROID) 75 MCG tablet Take 1 tablet (75 mcg total) by mouth daily before breakfast. 30 minutes before food   losartan-hydrochlorothiazide (HYZAAR) 100-25 MG tablet Take 1 tablet by mouth daily. In am   metFORMIN (GLUCOPHAGE) 500 MG tablet 1 pill daily in the am with food   Omega-3 Fatty Acids (FISH OIL) 1000 MG CAPS Take 1,000 mg by mouth 2 (two) times daily.    omeprazole (PRILOSEC) 20 MG capsule Take 1 capsule (20 mg total) by mouth daily. 30 min before food   TRUE METRIX BLOOD GLUCOSE TEST test strip    TRUEplus Lancets 33G MISC    No Known Allergies Recent Results (from the past 2160 hour(s))  HM DIABETES EYE EXAM     Status: None   Collection Time: 09/16/21 12:00 AM  Result Value Ref Range   HM Diabetic Eye Exam No Retinopathy No Retinopathy    Comment: [facility] 09/16/21   Objective  Body mass index is 41.57 kg/m. Wt Readings from Last 3 Encounters:  11/10/21 242 lb 3.2 oz (109.9 kg)  10/20/21 243 lb 12.8 oz (110.6 kg)  06/23/21 250 lb 3.2 oz (113.5 kg)   Temp Readings from Last 3 Encounters:  11/10/21 98.1 F (36.7 C) (Oral)  10/20/21 (!) 97.5 F (36.4 C) (Temporal)  06/23/21 98.3 F (36.8 C) (Oral)   BP Readings from Last 3 Encounters:  11/10/21 130/80  10/20/21 126/80  06/23/21 122/70   Pulse Readings from Last 3 Encounters:   11/10/21 (!) 54  10/20/21 63  06/23/21 63    Physical Exam Vitals and nursing note reviewed.  Constitutional:      Appearance: Normal appearance. She is well-developed and well-groomed.  HENT:     Head: Normocephalic and atraumatic.  Eyes:     Conjunctiva/sclera: Conjunctivae normal.     Pupils: Pupils are equal, round, and reactive to light.  Cardiovascular:     Rate and Rhythm: Normal rate and regular rhythm.     Heart sounds: Normal heart sounds. No murmur heard. Pulmonary:     Effort: Pulmonary effort is normal.     Breath sounds: Normal breath sounds.  Abdominal:     General: Abdomen is flat. Bowel sounds are normal.     Tenderness: There is no abdominal tenderness.  Musculoskeletal:        General: No tenderness.  Skin:    General: Skin is warm and dry.  Neurological:     General: No focal deficit present.     Mental Status: She is alert and oriented to person, place, and time. Mental status is at baseline.     Cranial Nerves: Cranial nerves 2-12 are intact.     Motor: Motor function is intact.     Coordination: Coordination is intact.     Gait: Gait is intact.  Psychiatric:        Attention and Perception: Attention and perception normal.        Mood and Affect: Mood and affect normal.        Speech: Speech normal.        Behavior: Behavior normal. Behavior is cooperative.        Thought Content: Thought content normal.  Cognition and Memory: Cognition and memory normal.        Judgment: Judgment normal.     Assessment  Plan  Hypertension associated with diabetes ([place]) controlled - Plan: Comprehensive metabolic panel, Lipid panel, CBC with Differential/Platelet, Hemoglobin A1c norvasc 10 mg qd lipitor 10 mg qhs, coreg 25 mg bid, hydralazine 10 mg bid, hyzaar 100-25, metformin 500 mg qd   Hypothyroidism, unspecified type - Plan: TSH On levo 75 mcg qd  Recurrent UTI - Plan: Urine Culture   HM Flu shot utd  Tdap had 07/04/2018  pna 23 utd Consider  prevnar 20 In future  prevnar utd Had zostervax, shingrix had 2/2 3/3 covid 19 rec booster consider    Mammogram 09/08/21 negative ordered Out of age window pap  Colonoscopy had 12/07/16 IH, diverticulosis and tubular adenoma f/u in 5 years Dr. Allen Norris only if ab pain or bleeding ask Dr. Allen Norris if due  11/10/21 [name], MD  McLean-Scocuzza, Nino Glow, MD The recommendations are not clear-cut and go as follows: Patient preference and patient concern in addition to a 10-year life expectancy and less than the age of 35.  DEXA get report prior [facility]. Qu[facility] had 5 years ago  -09/07/20 normal    OSA f/u pulm, [facility] 11/12/2018 and f/u pulm 11/19/2018, referred again  [place] on CPAP   dentist appt sch 10/20/19  Eye doctor 06/2021 Provider: Dr. Olivia Mackie McLean-Scocuzza-Internal [facility]

## 2021-11-11 LAB — URINE CULTURE
MICRO NUMBER:: 13500924
SPECIMEN QUALITY:: ADEQUATE

## 2021-11-14 ENCOUNTER — Telehealth: Payer: Self-pay | Admitting: Adult Health

## 2021-11-14 DIAGNOSIS — Z9989 Dependence on other enabling machines and devices: Secondary | ICD-10-CM

## 2021-11-14 NOTE — Telephone Encounter (Signed)
-----   Message from Delorise Jackson, MD sent at 11/10/2021 10:39 AM EDT ----- Pt needs new CPAP message saying electrical system going out check with provider  Can you all contact the pt and help with this please?

## 2021-11-14 NOTE — Telephone Encounter (Signed)
Called the pt and there was no answer- LMTCB    

## 2021-11-14 NOTE — Telephone Encounter (Signed)
Please call patient to see what we need to do to help with CPAP

## 2021-11-15 NOTE — Telephone Encounter (Signed)
Pt called stating her insurance can not cover the cpap machine and they will send it to [facility] to see if they will cover it

## 2021-11-15 NOTE — Telephone Encounter (Signed)
Called and spoke with patient's daughter who states that they are waiting to hear from [facility] about new machine. Then she called back and stated that everything is being transferred to [facility]. Called and spoke with Leroy Sea from [facility] who states that as of July 1st anyone with  [facility] [employer] HMO will be transferred from their current DME to [facility]. Advised him of message that machine is currently giving her and he said to go ahead and send over an order. New order has been placed. Called and spoke with daughter to let her know. Nothing further needed at this time.

## 2021-11-24 ENCOUNTER — Ambulatory Visit (INDEPENDENT_AMBULATORY_CARE_PROVIDER_SITE_OTHER): Payer: Medicare HMO | Admitting: Podiatry

## 2021-11-24 ENCOUNTER — Encounter: Payer: Self-pay | Admitting: Podiatry

## 2021-11-24 DIAGNOSIS — M79674 Pain in right toe(s): Secondary | ICD-10-CM

## 2021-11-24 DIAGNOSIS — B351 Tinea unguium: Secondary | ICD-10-CM | POA: Diagnosis not present

## 2021-11-24 DIAGNOSIS — M79675 Pain in left toe(s): Secondary | ICD-10-CM

## 2021-11-24 DIAGNOSIS — E119 Type 2 diabetes mellitus without complications: Secondary | ICD-10-CM | POA: Diagnosis not present

## 2021-11-24 NOTE — Progress Notes (Signed)
This patient returns to my office for at risk foot care.  This patient requires this care by a professional since this patient will be at risk due to having diabetic neuropathy.  This patient is unable to cut nails herself since the patient cannot reach hernails.These nails are painful walking and wearing shoes.  This patient presents for at risk foot care today.  General Appearance  Alert, conversant and in no acute stress.  Vascular  Dorsalis pedis and posterior tibial  pulses are palpable  bilaterally.  Capillary return is within normal limits  bilaterally. Temperature is within normal limits  bilaterally.  Neurologic  Senn-Weinstein monofilament wire test diminished   bilaterally. Muscle power within normal limits bilaterally.  Nails Thick disfigured discolored nails with subungual debris  from hallux to fifth toes bilaterally. No evidence of bacterial infection or drainage bilaterally.  Orthopedic  No limitations of motion  feet .  No crepitus or effusions noted.  No bony pathology or digital deformities noted.  Skin  normotropic skin with no porokeratosis noted bilaterally.  No signs of infections or ulcers noted.     Onychomycosis  Pain in right toes  Pain in left toes  Consent was obtained for treatment procedures.   Mechanical debridement of nails 1-5  bilaterally performed with a nail nipper.  Filed with dremel without incident.    Return office visit   3 months                   Told patient to return for periodic foot care and evaluation due to potential at risk complications.   Sameera Betton DPM   

## 2022-01-03 ENCOUNTER — Ambulatory Visit: Payer: Medicare HMO | Admitting: Adult Health

## 2022-02-14 ENCOUNTER — Ambulatory Visit (INDEPENDENT_AMBULATORY_CARE_PROVIDER_SITE_OTHER): Payer: Medicare HMO | Admitting: Internal Medicine

## 2022-02-14 ENCOUNTER — Telehealth: Payer: Self-pay

## 2022-02-14 ENCOUNTER — Other Ambulatory Visit: Payer: Self-pay

## 2022-02-14 ENCOUNTER — Encounter: Payer: Self-pay | Admitting: Internal Medicine

## 2022-02-14 VITALS — BP 132/70 | HR 58 | Temp 98.1°F | Ht 64.0 in | Wt 238.2 lb

## 2022-02-14 DIAGNOSIS — N3000 Acute cystitis without hematuria: Secondary | ICD-10-CM | POA: Diagnosis not present

## 2022-02-14 DIAGNOSIS — Z0000 Encounter for general adult medical examination without abnormal findings: Secondary | ICD-10-CM

## 2022-02-14 DIAGNOSIS — E785 Hyperlipidemia, unspecified: Secondary | ICD-10-CM

## 2022-02-14 DIAGNOSIS — Z23 Encounter for immunization: Secondary | ICD-10-CM | POA: Diagnosis not present

## 2022-02-14 DIAGNOSIS — Z86010 Personal history of colonic polyps: Secondary | ICD-10-CM

## 2022-02-14 DIAGNOSIS — Z1231 Encounter for screening mammogram for malignant neoplasm of breast: Secondary | ICD-10-CM

## 2022-02-14 DIAGNOSIS — K219 Gastro-esophageal reflux disease without esophagitis: Secondary | ICD-10-CM

## 2022-02-14 DIAGNOSIS — E119 Type 2 diabetes mellitus without complications: Secondary | ICD-10-CM

## 2022-02-14 DIAGNOSIS — K921 Melena: Secondary | ICD-10-CM

## 2022-02-14 DIAGNOSIS — E1159 Type 2 diabetes mellitus with other circulatory complications: Secondary | ICD-10-CM

## 2022-02-14 DIAGNOSIS — I152 Hypertension secondary to endocrine disorders: Secondary | ICD-10-CM

## 2022-02-14 DIAGNOSIS — I10 Essential (primary) hypertension: Secondary | ICD-10-CM

## 2022-02-14 DIAGNOSIS — E039 Hypothyroidism, unspecified: Secondary | ICD-10-CM

## 2022-02-14 LAB — CBC WITH DIFFERENTIAL/PLATELET
Basophils Absolute: 0.1 10*3/uL (ref 0.0–0.1)
Basophils Relative: 0.9 % (ref 0.0–3.0)
Eosinophils Absolute: 0.2 10*3/uL (ref 0.0–0.7)
Eosinophils Relative: 2.7 % (ref 0.0–5.0)
HCT: 37.5 % (ref 36.0–46.0)
Hemoglobin: 12.2 g/dL (ref 12.0–15.0)
Lymphocytes Relative: 33.5 % (ref 12.0–46.0)
Lymphs Abs: 2.7 10*3/uL (ref 0.7–4.0)
MCHC: 32.5 g/dL (ref 30.0–36.0)
MCV: 81.3 fl (ref 78.0–100.0)
Monocytes Absolute: 0.5 10*3/uL (ref 0.1–1.0)
Monocytes Relative: 6.7 % (ref 3.0–12.0)
Neutro Abs: 4.5 10*3/uL (ref 1.4–7.7)
Neutrophils Relative %: 56.2 % (ref 43.0–77.0)
Platelets: 235 10*3/uL (ref 150.0–400.0)
RBC: 4.62 Mil/uL (ref 3.87–5.11)
RDW: 14.1 % (ref 11.5–15.5)
WBC: 8 10*3/uL (ref 4.0–10.5)

## 2022-02-14 LAB — COMPREHENSIVE METABOLIC PANEL
ALT: 7 U/L (ref 0–35)
AST: 13 U/L (ref 0–37)
Albumin: 4 g/dL (ref 3.5–5.2)
Alkaline Phosphatase: 66 U/L (ref 39–117)
BUN: 22 mg/dL (ref 6–23)
CO2: 32 mEq/L (ref 19–32)
Calcium: 9.6 mg/dL (ref 8.4–10.5)
Chloride: 99 mEq/L (ref 96–112)
Creatinine, Ser: 0.9 mg/dL (ref 0.40–1.20)
GFR: 60.62 mL/min (ref >60.00)
Glucose, Bld: 94 mg/dL (ref 70–99)
Potassium: 3.6 mEq/L (ref 3.5–5.1)
Sodium: 140 mEq/L (ref 135–145)
Total Bilirubin: 0.8 mg/dL (ref 0.2–1.2)
Total Protein: 7.3 g/dL (ref 6.0–8.3)

## 2022-02-14 LAB — LIPID PANEL
Cholesterol: 143 mg/dL (ref 0–200)
HDL: 63.4 mg/dL (ref >39.00)
LDL Cholesterol: 67 mg/dL (ref 0–99)
NonHDL: 79.56
Total CHOL/HDL Ratio: 2
Triglycerides: 64 mg/dL (ref 0.0–149.0)
VLDL: 12.8 mg/dL (ref 0.0–40.0)

## 2022-02-14 LAB — TSH: TSH: 5.7 u[IU]/mL — ABNORMAL HIGH (ref 0.35–5.50)

## 2022-02-14 LAB — HEMOGLOBIN A1C: Hgb A1c MFr Bld: 6.4 % (ref 4.6–6.5)

## 2022-02-14 MED ORDER — LOSARTAN POTASSIUM-HCTZ 100-25 MG PO TABS: 1.0000 | ORAL_TABLET | Freq: Every day | ORAL | 3 refills | Status: DC

## 2022-02-14 MED ORDER — METFORMIN HCL 500 MG PO TABS: ORAL_TABLET | ORAL | 3 refills | Status: DC

## 2022-02-14 MED ORDER — HYDRALAZINE HCL 10 MG PO TABS: 10.0000 mg | ORAL_TABLET | Freq: Two times a day (BID) | ORAL | 3 refills | Status: DC

## 2022-02-14 MED ORDER — LEVOTHYROXINE SODIUM 75 MCG PO TABS: 75.0000 ug | ORAL_TABLET | Freq: Every day | ORAL | 3 refills | Status: DC

## 2022-02-14 MED ORDER — CLENPIQ 10-3.5-12 MG-GM -GM/160ML PO SOLN: 1.0000 | Freq: Once | ORAL | 0 refills | Status: AC

## 2022-02-14 MED ORDER — OMEPRAZOLE 20 MG PO CPDR: 20.0000 mg | DELAYED_RELEASE_CAPSULE | Freq: Every day | ORAL | 3 refills | Status: DC

## 2022-02-14 MED ORDER — CARVEDILOL 25 MG PO TABS: 25.0000 mg | ORAL_TABLET | Freq: Two times a day (BID) | ORAL | 3 refills | Status: DC

## 2022-02-14 MED ORDER — ATORVASTATIN CALCIUM 10 MG PO TABS: 10.0000 mg | ORAL_TABLET | Freq: Every day | ORAL | 3 refills | Status: DC

## 2022-02-14 MED ORDER — AMLODIPINE BESYLATE 10 MG PO TABS: 10.0000 mg | ORAL_TABLET | Freq: Every day | ORAL | 3 refills | Status: DC

## 2022-02-14 NOTE — Telephone Encounter (Signed)
Gastroenterology Pre-Procedure Review  Request Date: 03/21/22 Requesting Physician: Dr. Vicente Males  PATIENT REVIEW QUESTIONS: The patient responded to the following health history questions as indicated:    1. Are you having any GI issues? no 2. Do you have a personal history of Polyps? yes (12/07/16 performed by dr. Allen Norris) 3. Do you have a family history of Colon Cancer or Polyps? yes (mother colon polyps) 4. Diabetes Mellitus? yes (type 2 diabetic takes metformin has been advised to hold 2 days prior to colonoscopy) 5. Joint replacements in the past 12 months?no 6. Major health problems in the past 3 months?no 7. Any artificial heart valves, MVP, or defibrillator?no    MEDICATIONS & ALLERGIES:    Patient reports the following regarding taking any anticoagulation/antiplatelet therapy:   Plavix, Coumadin, Eliquis, Xarelto, Lovenox, Pradaxa, Brilinta, or Effient? no Aspirin? no  Patient confirms/reports the following medications:  Current Outpatient Medications  Medication Sig Dispense Refill   amLODipine (NORVASC) 10 MG tablet Take 1 tablet (10 mg total) by mouth daily. 90 tablet 3   atorvastatin (LIPITOR) 10 MG tablet Take 1 tablet (10 mg total) by mouth daily at 6 PM. 90 tablet 3   Blood Glucose Calibration (TRUE METRIX LEVEL 2) Normal SOLN      blood glucose meter kit and supplies Dispense based on patient and insurance preference. Use up to four times daily as directed. (FOR ICD-10 E10.9, E11.9). 1 each 0   Calcium Carb-Cholecalciferol 600-800 MG-UNIT TABS Take 1 tablet by mouth 2 (two) times daily.     carvedilol (COREG) 25 MG tablet Take 1 tablet (25 mg total) by mouth 2 (two) times daily with a meal. 180 tablet 3   docusate sodium (COLACE) 100 MG capsule Take 100 mg by mouth 2 (two) times daily as needed.     hydrALAZINE (APRESOLINE) 10 MG tablet Take 1 tablet (10 mg total) by mouth in the morning and at bedtime. 180 tablet 3   levothyroxine (SYNTHROID) 75 MCG tablet Take 1 tablet (75  mcg total) by mouth daily before breakfast. 30 minutes before food 90 tablet 3   losartan-hydrochlorothiazide (HYZAAR) 100-25 MG tablet Take 1 tablet by mouth daily. In am 90 tablet 3   metFORMIN (GLUCOPHAGE) 500 MG tablet 1 pill daily in the am with food 90 tablet 3   Omega-3 Fatty Acids (FISH OIL) 1000 MG CAPS Take 1,000 mg by mouth 2 (two) times daily.      omeprazole (PRILOSEC) 20 MG capsule Take 1 capsule (20 mg total) by mouth daily. 30 min before food 90 capsule 3   TRUE METRIX BLOOD GLUCOSE TEST test strip      TRUEplus Lancets 33G MISC      No current facility-administered medications for this visit.    Patient confirms/reports the following allergies:  No Known Allergies  No orders of the defined types were placed in this encounter.   AUTHORIZATION INFORMATION Primary Insurance: 1D#: Group #:  Secondary Insurance: 1D#: Group #:  SCHEDULE INFORMATION: Date: 03/21/22 Time: Location: [facility]

## 2022-02-14 NOTE — Patient Instructions (Addendum)
Dr. Volanda Napoleon f/u 3-6 months   Consider 4th covid 19 shot    Chauncey Cruel. Makhlouf DMD, MS, PA 5.0 [phone] Google reviews Dentist in [place], [place] in: [facility] Address: [address], [place], [place] [address] Hours:  Open ? Closes 5?PM Products and Services: [facility] Phone: [phone]

## 2022-02-14 NOTE — Progress Notes (Signed)
Chief Complaint  Patient presents with   Follow-up   Annual Exam   Annual  1. Htn controlled on norvasc 10 mg qd, coreg 25 mg bid, hydralazine 10 mg bid, hyzaar 100-25 mg qd  2. Dm 2 controlled on metformin 500 mg bid hld lipitor 10 mg qhs  3. Hypothyroidism on levo 75 mcg qd  4. H/o recurrent uti c/o burning and freq and left lower back pain since Sunday and tried macrobid 100 mg qd her grandaughters Rx   Review of Systems  Constitutional:  Negative for weight loss.  HENT:  Negative for hearing loss.   Eyes:  Negative for blurred vision.  Respiratory:  Negative for shortness of breath.   Cardiovascular:  Negative for chest pain.  Gastrointestinal:  Negative for abdominal pain and blood in stool.  Genitourinary:  Negative for dysuria.  Musculoskeletal:  Negative for falls and joint pain.  Skin:  Negative for rash.  Neurological:  Negative for headaches.  Psychiatric/Behavioral:  Negative for depression.    Past Medical History:  Diagnosis Date   Arthritis    Diabetes ([place]) 02/19/2015   Diverticulosis 12/07/2016   Sigmoid Colon   Diverticulosis of sigmoid colon 12/08/2016   Elevated blood sugar 01/15/2015   GERD (gastroesophageal reflux disease)    Hemorrhoids    History of blood transfusion    Hyperlipidemia    Hypertension 01/15/2015   Obesity 01/15/2015   OSA on CPAP    UTI (urinary tract infection)    Past Surgical History:  Procedure Laterality Date   BOTOX INJECTION N/A 03/27/2018   Procedure: BOTOX INJECTION;  Surgeon: Jules Husbands, MD;  Location: [facility];  Service: General;  Laterality: N/A;   CATARACT EXTRACTION W/PHACO Left 05/31/2020   Procedure: CATARACT EXTRACTION PHACO AND INTRAOCULAR LENS PLACEMENT ([place]) LEFT;  Surgeon: Eulogio Bear, MD;  Location: [place];  Service: Ophthalmology;  Laterality: Left;  4.16 0:40.4   CATARACT EXTRACTION W/PHACO Right 07/05/2020   Procedure: CATARACT EXTRACTION PHACO AND INTRAOCULAR LENS PLACEMENT ([facility])  RIGHT DIABETIC 3.20 00:26.3;  Surgeon: Eulogio Bear, MD;  Location: [place];  Service: Ophthalmology;  Laterality: Right;  Diabetic - oral meds   COLONOSCOPY     COLONOSCOPY WITH PROPOFOL N/A 12/07/2016   Procedure: COLONOSCOPY WITH PROPOFOL;  Surgeon: [name], MD;  Location: [place];  Service: Endoscopy;  Laterality: N/A;  Diabetic - oral meds   EVALUATION UNDER ANESTHESIA WITH ANAL FISSUROTOMY N/A 03/27/2018   Procedure: EXAM UNDER ANESTHESIA, CHEMICAL SPHINCTEROTOMY;  Surgeon: Jules Husbands, MD;  Location: [facility];  Service: General;  Laterality: N/A;   HEMORRHOID SURGERY N/A 06/13/2018   Procedure: [id number];  Surgeon: Jules Husbands, MD;  Location: [facility];  Service: General;  Laterality: N/A;   JOINT REPLACEMENT     b/l hips in [place] 2004 and 2005 Dr. Alfonso Ramus    POLYPECTOMY  12/07/2016   Procedure: POLYPECTOMY;  Surgeon: [name], MD;  Location: [place];  Service: Endoscopy;;   RECTAL EXAM UNDER ANESTHESIA N/A 06/13/2018   Procedure: RECTAL EXAM UNDER ANESTHESIA;  Surgeon: Jules Husbands, MD;  Location: [facility];  Service: General;  Laterality: N/A;   TOTAL HIP ARTHROPLASTY Bilateral    TUBAL LIGATION     Family History  Problem Relation Age of Onset   Dementia Mother    Hypertension Mother    Arthritis Mother    Alcohol abuse Father    Cirrhosis Father    Cancer Daughter  breast   COPD Brother    Breast cancer Sister 68   Cancer Sister        ? type    Dementia Sister    Cancer Brother        ?type   Cancer Brother        cancer ?type   Cancer Daughter        cancer ?type died 46    Alzheimer's disease Sister    Social History   Socioeconomic History   Marital status: Divorced    Spouse name: Not on file   Number of children: Not on file   Years of education: Not on file   Highest education level: Not on file  Occupational History   Occupation: Astronomer covers     Comment: retired  Tobacco Use   Smoking status: Never   Smokeless tobacco: Never   Tobacco comments:    Never   Scientific laboratory technician Use: Never used  Substance and Sexual Activity   Alcohol use: No    Alcohol/week: 0.0 standard drinks of alcohol   Drug use: No   Sexual activity: Not Currently  Other Topics Concern   Not on file  Social History Narrative   Lives with daughter [name] to go to church    Moved from [place] 3-4 years ago    No guns, wears seat belt    Never smoker    Social Determinants of Health   Financial Resource Strain: Low Risk  (05/09/2021)   Overall Financial Resource Strain (CARDIA)    Difficulty of Paying Living Expenses: Not hard at all  Food Insecurity: No Food Insecurity (05/09/2021)   Hunger Vital Sign    Worried About Running Out of Food in the Last Year: Never true    [place] in the Last Year: Never true  Transportation Needs: No Transportation Needs (05/09/2021)   PRAPARE - Hydrologist (Medical): No    Lack of Transportation (Non-Medical): No  Physical Activity: Not on file  Stress: No Stress Concern Present (05/09/2021)   [place]    Feeling of Stress : Not at all  Social Connections: Unknown (05/09/2021)   Social Connection and Isolation Panel [NHANES]    Frequency of Communication with Friends and Family: More than three times a week    Frequency of Social Gatherings with Friends and Family: Once a week    Attends Religious Services: Not on Diplomatic Services operational officer of Clubs or Organizations: Not on file    Attends Archivist Meetings: Not on file    Marital Status: Not on file  Intimate Partner Violence: Not At Risk (05/09/2021)   Humiliation, Afraid, Rape, and Kick questionnaire    Fear of Current or Ex-Partner: No    Emotionally Abused: No    Physically Abused: No    Sexually Abused: No   Current Meds  Medication  Sig   Blood Glucose Calibration (TRUE METRIX LEVEL 2) Normal SOLN    blood glucose meter kit and supplies Dispense based on patient and insurance preference. Use up to four times daily as directed. (FOR ICD-10 E10.9, E11.9).   Calcium Carb-Cholecalciferol 600-800 MG-UNIT TABS Take 1 tablet by mouth 2 (two) times daily.   docusate sodium (COLACE) 100 MG capsule Take 100 mg by mouth 2 (two) times daily as needed.   Omega-3 Fatty  Acids (FISH OIL) 1000 MG CAPS Take 1,000 mg by mouth 2 (two) times daily.    TRUE METRIX BLOOD GLUCOSE TEST test strip    TRUEplus Lancets 33G MISC    [DISCONTINUED] amLODipine (NORVASC) 10 MG tablet Take 1 tablet (10 mg total) by mouth daily.   [DISCONTINUED] atorvastatin (LIPITOR) 10 MG tablet Take 1 tablet (10 mg total) by mouth daily at 6 PM.   [DISCONTINUED] carvedilol (COREG) 25 MG tablet Take 1 tablet (25 mg total) by mouth 2 (two) times daily with a meal.   [DISCONTINUED] hydrALAZINE (APRESOLINE) 10 MG tablet Take 1 tablet (10 mg total) by mouth in the morning and at bedtime.   [DISCONTINUED] levothyroxine (SYNTHROID) 75 MCG tablet Take 1 tablet (75 mcg total) by mouth daily before breakfast. 30 minutes before food   [DISCONTINUED] losartan-hydrochlorothiazide (HYZAAR) 100-25 MG tablet Take 1 tablet by mouth daily. In am   [DISCONTINUED] metFORMIN (GLUCOPHAGE) 500 MG tablet 1 pill daily in the am with food   [DISCONTINUED] omeprazole (PRILOSEC) 20 MG capsule Take 1 capsule (20 mg total) by mouth daily. 30 min before food   No Known Allergies No results found for this or any previous visit (from the past 2160 hour(s)). Objective  Body mass index is 40.89 kg/m. Wt Readings from Last 3 Encounters:  02/14/22 238 lb 3.2 oz (108 kg)  11/10/21 242 lb 3.2 oz (109.9 kg)  10/20/21 243 lb 12.8 oz (110.6 kg)   Temp Readings from Last 3 Encounters:  02/14/22 98.1 F (36.7 C) (Oral)  11/10/21 98.1 F (36.7 C) (Oral)  10/20/21 (!) 97.5 F (36.4 C) (Temporal)   BP  Readings from Last 3 Encounters:  02/14/22 132/70  11/10/21 130/80  10/20/21 126/80   Pulse Readings from Last 3 Encounters:  02/14/22 (!) 58  11/10/21 (!) 54  10/20/21 63    Physical Exam Vitals and nursing note reviewed.  Constitutional:      Appearance: Normal appearance. She is well-developed and well-groomed.  HENT:     Head: Normocephalic and atraumatic.  Eyes:     Conjunctiva/sclera: Conjunctivae normal.     Pupils: Pupils are equal, round, and reactive to light.  Cardiovascular:     Rate and Rhythm: Normal rate and regular rhythm.     Heart sounds: Normal heart sounds. No murmur heard. Pulmonary:     Effort: Pulmonary effort is normal.     Breath sounds: Normal breath sounds.  Abdominal:     General: Abdomen is flat. Bowel sounds are normal.     Tenderness: There is no abdominal tenderness.  Musculoskeletal:        General: No tenderness.  Skin:    General: Skin is warm and dry.  Neurological:     General: No focal deficit present.     Mental Status: She is alert and oriented to person, place, and time. Mental status is at baseline.     Cranial Nerves: Cranial nerves 2-12 are intact.     Motor: Motor function is intact.     Coordination: Coordination is intact.     Gait: Gait is intact.  Psychiatric:        Attention and Perception: Attention and perception normal.        Mood and Affect: Mood and affect normal.        Speech: Speech normal.        Behavior: Behavior normal. Behavior is cooperative.        Thought Content: Thought content normal.  Cognition and Memory: Cognition and memory normal.        Judgment: Judgment normal.     Assessment  Plan  Annual physical exam See below  Acute cystitis without hematuria - Plan: Urine Culture  Hypertension associated with diabetes controlled/hld- Plan: Comprehensive metabolic panel, Lipid panel, CBC with Differential/Platelet, Hemoglobin A1c norvasc 10 mg qd, coreg 25 mg bid, hydralazine 10 mg bid,  hyzaar 100-25 mg qd  2. Dm 2 controlled on metformin 500 mg bid hld lipitor 10 mg qhs    Blood in stool screening - Plan: Fecal occult blood, imunochemical([facility])  Hypothyroidism, unspecified type - Plan: TSH, levothyroxine (SYNTHROID) 75 MCG tablet  Essential hypertension - Plan: carvedilol (COREG) 25 MG tablet, amLODipine (NORVASC) 10 MG tablet, hydrALAZINE (APRESOLINE) 10 MG tablet, losartan-hydrochlorothiazide (HYZAAR) 100-25 MG tablet  Gastroesophageal reflux disease without esophagitis - Plan: omeprazole (PRILOSEC) 20 MG capsule  Hyperlipidemia - Plan: atorvastatin (LIPITOR) 10 MG tablet  Type 2 diabetes mellitus without complication, without long-term current use of insulin (HCC) - Plan: metFORMIN (GLUCOPHAGE) 500 MG tablet   HM Flu shot given today Tdap had 03/28/2019  pna 23 utd Consider prevnar 20 In future in 07/05/2023 prevnar utd Had zostervax, shingrix had 2/2 3/3 covid 19 rec booster consider    Mammogram 09/08/21 negative ordered Out of age window pap   Colonoscopy had 12/07/16 IH, diverticulosis and tubular adenoma f/u in 5 years Dr. Allen Norris only if ab pain or bleeding ask Dr. Allen Norris if due  11/10/21 [name], MD  McLean-Scocuzza, Nino Glow, MD The recommendations are not clear-cut and go as follows: Patient preference and patient concern in addition to a 10-year  life expectancy and less than the age of 24.  DEXA get report prior [facility] Dr. Qu[facility] had 5 years ago  -09/07/20 normal    OSA f/u pulm, [facility] sch 11/12/2018 and f/u pulm 11/19/2018, referred again  [place] on CPAP   dentist appt sch 10/20/19  Eye doctor 06/2021 Provider: Dr. Olivia Mackie McLean-Scocuzza-Internal Medicine

## 2022-02-14 NOTE — Telephone Encounter (Signed)
-----   Message from [name], MD sent at 02/14/2022  2:46 PM EDT ----- Will do. ----- Message ----- From: McLean-Scocuzza, Nino Glow, MD Sent: 02/14/2022   2:45 PM EDT To: [name], MD  She doing well can your office call pt to schedule please thank you  ----- Message ----- From: [name], MD Sent: 02/14/2022   2:44 PM EDT To: Nino Glow McLean-Scocuzza, MD  The guidelines for repeating colonoscopies for adenomas after the age of 67 are based on the patient's preference and the patient's 10-year survival.  If the patient is very healthy and appears to have a 10-year survival and is willing to undergo a repeat colonoscopy then we can proceed with a repeat colonoscopy at her age. ----- Message ----- From: McLean-Scocuzza, Nino Glow, MD Sent: 02/14/2022   9:58 AM EDT To: [name], MD  Are you agreeable to repeat colonoscopy tubular adenoma in 2018?

## 2022-02-16 ENCOUNTER — Other Ambulatory Visit: Payer: Self-pay | Admitting: Internal Medicine

## 2022-02-16 DIAGNOSIS — N390 Urinary tract infection, site not specified: Secondary | ICD-10-CM

## 2022-02-16 LAB — URINE CULTURE
MICRO NUMBER:: 13905618
SPECIMEN QUALITY:: ADEQUATE

## 2022-02-16 MED ORDER — AMOXICILLIN-POT CLAVULANATE 875-125 MG PO TABS: 1.0000 | ORAL_TABLET | Freq: Two times a day (BID) | ORAL | 0 refills | Status: DC

## 2022-02-17 ENCOUNTER — Telehealth: Payer: Self-pay | Admitting: Internal Medicine

## 2022-02-17 LAB — FECAL OCCULT BLOOD, IMMUNOCHEMICAL: Fecal Occult Bld: NEGATIVE

## 2022-02-17 NOTE — Telephone Encounter (Signed)
Pt has been notified of results and verbalized understanding  

## 2022-02-17 NOTE — Telephone Encounter (Signed)
Pt would like to be called regarding her blood work

## 2022-02-27 ENCOUNTER — Encounter: Payer: Self-pay | Admitting: Podiatry

## 2022-02-27 ENCOUNTER — Ambulatory Visit: Payer: Medicare HMO | Admitting: Podiatry

## 2022-02-27 DIAGNOSIS — M79675 Pain in left toe(s): Secondary | ICD-10-CM

## 2022-02-27 DIAGNOSIS — M79674 Pain in right toe(s): Secondary | ICD-10-CM

## 2022-02-27 DIAGNOSIS — E119 Type 2 diabetes mellitus without complications: Secondary | ICD-10-CM

## 2022-02-27 DIAGNOSIS — B351 Tinea unguium: Secondary | ICD-10-CM | POA: Diagnosis not present

## 2022-02-27 NOTE — Progress Notes (Signed)
This patient returns to my office for at risk foot care.  This patient requires this care by a professional since this patient will be at risk due to having diabetic neuropathy.  This patient is unable to cut nails herself since the patient cannot reach hernails.These nails are painful walking and wearing shoes.  This patient presents for at risk foot care today.  General Appearance  Alert, conversant and in no acute stress.  Vascular  Dorsalis pedis and posterior tibial  pulses are palpable  bilaterally.  Capillary return is within normal limits  bilaterally. Temperature is within normal limits  bilaterally.  Neurologic  Senn-Weinstein monofilament wire test diminished   bilaterally. Muscle power within normal limits bilaterally.  Nails Thick disfigured discolored nails with subungual debris  from hallux to fifth toes bilaterally. No evidence of bacterial infection or drainage bilaterally.  Orthopedic  No limitations of motion  feet .  No crepitus or effusions noted.  No bony pathology or digital deformities noted.  Skin  normotropic skin with no porokeratosis noted bilaterally.  No signs of infections or ulcers noted.     Onychomycosis  Pain in right toes  Pain in left toes  Consent was obtained for treatment procedures.   Mechanical debridement of nails 1-5  bilaterally performed with a nail nipper.  Filed with dremel without incident.    Return office visit   3 months                   Told patient to return for periodic foot care and evaluation due to potential at risk complications.   Laneisha Mino DPM   

## 2022-03-14 ENCOUNTER — Other Ambulatory Visit: Payer: Self-pay

## 2022-03-14 ENCOUNTER — Telehealth: Payer: Self-pay

## 2022-03-14 DIAGNOSIS — Z86010 Personal history of colonic polyps: Secondary | ICD-10-CM

## 2022-03-14 MED ORDER — CLENPIQ 10-3.5-12 MG-GM -GM/160ML PO SOLN: 1.0000 | Freq: Once | ORAL | 0 refills | Status: DC

## 2022-03-14 MED ORDER — PEG 3350-KCL-NA BICARB-NACL 420 G PO SOLR: 4000.0000 mL | Freq: Once | ORAL | 0 refills | Status: AC

## 2022-03-14 MED ORDER — NA SULFATE-K SULFATE-MG SULF 17.5-3.13-1.6 GM/177ML PO SOLN: 1.0000 | Freq: Once | ORAL | 0 refills | Status: AC

## 2022-03-14 NOTE — Progress Notes (Signed)
Suprep not covered trying Nulytely.  Thanks, [place], [place]

## 2022-03-14 NOTE — Telephone Encounter (Signed)
Patient contacted office to ask for assistance getting her ClenPiq prep.  I sent rx to pharmacy. Called pharmacy and pharmacist informed that ClenPiq is out of stock.  Changed rx from ClenPiq to [place].  Suprep was not covered. Colonoscopy prep changed to Nulytely prep.  This was in stock and preferred.  Contacted patient to advise on rx prep change to Nulytely Prep.  The following directions were given: At [facility] container to the fill line with clear liquid.  Mix well.  Drink 8 oz every 15-30 minutes until half has been completed.  Then 5 hours prior to Colonoscopy resume drinking the remainder half-completing prep 2 hours prior to colonoscopy.   Pt verbalized understanding and asked her to call back if she has any questions.  Thanks,  [place], [place]

## 2022-03-21 ENCOUNTER — Ambulatory Visit: Payer: Medicare HMO | Admitting: Anesthesiology

## 2022-03-21 ENCOUNTER — Encounter
Admission: RE | Disposition: A | Discharge status: Home / Self Care | Payer: Self-pay | Source: Home / Self Care | Attending: Gastroenterology

## 2022-03-21 ENCOUNTER — Ambulatory Visit
Admission: RE | Admit: 2022-03-21 | Discharge: 2022-03-21 | Disposition: A | Discharge status: Home / Self Care | Payer: Medicare HMO | Attending: Gastroenterology | Admitting: Gastroenterology

## 2022-03-21 DIAGNOSIS — Z1211 Encounter for screening for malignant neoplasm of colon: Secondary | ICD-10-CM | POA: Insufficient documentation

## 2022-03-21 DIAGNOSIS — Z6841 Body Mass Index (BMI) 40.0 and over, adult: Secondary | ICD-10-CM | POA: Diagnosis not present

## 2022-03-21 DIAGNOSIS — D124 Benign neoplasm of descending colon: Secondary | ICD-10-CM | POA: Diagnosis not present

## 2022-03-21 DIAGNOSIS — I10 Essential (primary) hypertension: Secondary | ICD-10-CM | POA: Insufficient documentation

## 2022-03-21 DIAGNOSIS — Z7984 Long term (current) use of oral hypoglycemic drugs: Secondary | ICD-10-CM | POA: Insufficient documentation

## 2022-03-21 DIAGNOSIS — E119 Type 2 diabetes mellitus without complications: Secondary | ICD-10-CM | POA: Insufficient documentation

## 2022-03-21 DIAGNOSIS — Z8249 Family history of ischemic heart disease and other diseases of the circulatory system: Secondary | ICD-10-CM | POA: Diagnosis not present

## 2022-03-21 DIAGNOSIS — K635 Polyp of colon: Secondary | ICD-10-CM | POA: Diagnosis not present

## 2022-03-21 DIAGNOSIS — Z8261 Family history of arthritis: Secondary | ICD-10-CM | POA: Insufficient documentation

## 2022-03-21 DIAGNOSIS — K219 Gastro-esophageal reflux disease without esophagitis: Secondary | ICD-10-CM | POA: Insufficient documentation

## 2022-03-21 DIAGNOSIS — E039 Hypothyroidism, unspecified: Secondary | ICD-10-CM | POA: Diagnosis not present

## 2022-03-21 DIAGNOSIS — M1990 Unspecified osteoarthritis, unspecified site: Secondary | ICD-10-CM | POA: Diagnosis not present

## 2022-03-21 DIAGNOSIS — Z8379 Family history of other diseases of the digestive system: Secondary | ICD-10-CM | POA: Insufficient documentation

## 2022-03-21 DIAGNOSIS — I2510 Atherosclerotic heart disease of native coronary artery without angina pectoris: Secondary | ICD-10-CM | POA: Diagnosis not present

## 2022-03-21 DIAGNOSIS — G4733 Obstructive sleep apnea (adult) (pediatric): Secondary | ICD-10-CM | POA: Diagnosis not present

## 2022-03-21 DIAGNOSIS — Z86010 Personal history of colonic polyps: Secondary | ICD-10-CM

## 2022-03-21 DIAGNOSIS — K640 First degree hemorrhoids: Secondary | ICD-10-CM | POA: Insufficient documentation

## 2022-03-21 DIAGNOSIS — K5730 Diverticulosis of large intestine without perforation or abscess without bleeding: Secondary | ICD-10-CM | POA: Insufficient documentation

## 2022-03-21 HISTORY — PX: COLONOSCOPY WITH PROPOFOL: SHX5780

## 2022-03-21 LAB — GLUCOSE, CAPILLARY: Glucose-Capillary: 110 mg/dL — ABNORMAL HIGH (ref 70–99)

## 2022-03-21 SURGERY — COLONOSCOPY WITH PROPOFOL
Anesthesia: General

## 2022-03-21 MED ORDER — PROPOFOL 500 MG/50ML IV EMUL
INTRAVENOUS | Status: DC | PRN
  Administered 2022-03-21: 175 ug/kg/min via INTRAVENOUS

## 2022-03-21 MED ORDER — PROPOFOL 10 MG/ML IV BOLUS
INTRAVENOUS | Status: DC | PRN
  Administered 2022-03-21: 80 mg via INTRAVENOUS

## 2022-03-21 MED ORDER — LIDOCAINE HCL (CARDIAC) PF 100 MG/5ML IV SOSY
PREFILLED_SYRINGE | INTRAVENOUS | Status: DC | PRN
  Administered 2022-03-21: 100 mg via INTRAVENOUS

## 2022-03-21 MED ORDER — SODIUM CHLORIDE 0.9 % IV SOLN: INTRAVENOUS | Status: DC

## 2022-03-21 NOTE — Anesthesia Postprocedure Evaluation (Signed)
Anesthesia Post Note  Patient: Taylor Shaw  Procedure(s) Performed: COLONOSCOPY WITH PROPOFOL  Patient location during evaluation: PACU Anesthesia Type: General Level of consciousness: awake and alert, oriented and patient cooperative Pain management: pain level controlled Vital Signs Assessment: post-procedure vital signs reviewed and stable Respiratory status: spontaneous breathing, nonlabored ventilation and respiratory function stable Cardiovascular status: blood pressure returned to baseline and stable Postop Assessment: adequate PO intake Anesthetic complications: no   No notable events documented.   Last Vitals:  Vitals:   03/21/22 1124 03/21/22 1134  BP: 134/61 (!) 147/58  Pulse: 61 (!) 59  Resp: 20 20  Temp:    SpO2: 100% 99%    Last Pain:  Vitals:   03/21/22 1134  TempSrc:   PainSc: 0-No pain                 Darrin Nipper

## 2022-03-21 NOTE — Anesthesia Preprocedure Evaluation (Addendum)
Anesthesia Evaluation  Patient identified by MRN, date of birth, ID band Patient awake    Reviewed: Allergy & Precautions, NPO status , Patient's Chart, lab work & pertinent test results  History of Anesthesia Complications Negative for: history of anesthetic complications  Airway Mallampati: III   Neck ROM: Full    Dental  (+) Missing, Chipped   Pulmonary sleep apnea and Continuous Positive Airway Pressure Ventilation ,    Pulmonary exam normal breath sounds clear to auscultation       Cardiovascular hypertension, + CAD  Normal cardiovascular exam Rhythm:Regular Rate:Normal     Neuro/Psych negative neurological ROS     GI/Hepatic GERD  ,  Endo/Other  diabetes, Type 2Hypothyroidism Class 3 obesity  Renal/GU negative Renal ROS     Musculoskeletal  (+) Arthritis ,   Abdominal   Peds  Hematology negative hematology ROS (+)   Anesthesia Other Findings Cardiology note 03/08/21:  1.  Coronary artery disease involving native coronary arteries without angina: Cardiac CTA showed mild nonobstructive disease for which I recommend aggressive medical therapy.  Her symptoms improved overall.  2.  Essential hypertension: Blood pressure is controlled on current medications.  3.  Hyperlipidemia: Given the presence of coronary atherosclerosis, I recommend continuing atorvastatin with a target LDL of less than 70.  Most recent lipid profile showed an LDL of 67.  4.  Type 2 diabetes: Generally well controlled with most recent hemoglobin A1c of 6.4.   Disposition:   FU with me in 12 months  Reproductive/Obstetrics                            Anesthesia Physical Anesthesia Plan  ASA: 3  Anesthesia Plan: General   Post-op Pain Management:    Induction: Intravenous  PONV Risk Score and Plan: 3 and Propofol infusion, TIVA and Treatment may vary due to age or medical condition  Airway Management  Planned: Natural Airway  Additional Equipment:   Intra-op Plan:   Post-operative Plan:   Informed Consent: I have reviewed the patients History and Physical, chart, labs and discussed the procedure including the risks, benefits and alternatives for the proposed anesthesia with the patient or authorized representative who has indicated his/her understanding and acceptance.       Plan Discussed with: CRNA  Anesthesia Plan Comments: (LMA/GETA backup discussed.  Patient consented for risks of anesthesia including but not limited to:  - adverse reactions to medications - damage to eyes, teeth, lips or other oral mucosa - nerve damage due to positioning  - sore throat or hoarseness - damage to heart, brain, nerves, lungs, other parts of body or loss of life  Informed patient about role of CRNA in peri- and intra-operative care.  Patient voiced understanding.)        Anesthesia Quick Evaluation

## 2022-03-21 NOTE — Op Note (Signed)
[facility] Gastroenterology Patient Name: Taylor Shaw Procedure Date: 03/21/2022 10:53 AM MRN: [id number] Account #: [id number] Date of Birth: 23-Jun-1940 Admit Type: Outpatient Age: 81 Room: [facility] ENDO ROOM 4 Gender: Female Note Status: Finalized Instrument Name: Jasper Riling [id number] Procedure:             Colonoscopy Indications:           High risk colon cancer surveillance: Personal history                         of colonic polyps Providers:             [name] MD, MD Referring MD:          Nino Glow Mclean-Scocuzza MD, MD (Referring MD) Medicines:             Propofol per Anesthesia Complications:         No immediate complications. Procedure:             Pre-Anesthesia Assessment:                        - Prior to the procedure, a History and Physical was                         performed, and patient medications and allergies were                         reviewed. The patient's tolerance of previous                         anesthesia was also reviewed. The risks and benefits                         of the procedure and the sedation options and risks                         were discussed with the patient. All questions were                         answered, and informed consent was obtained. Prior                         Anticoagulants: The patient has taken no previous                         anticoagulant or antiplatelet agents. ASA Grade                         Assessment: II - A patient with mild systemic disease.                         After reviewing the risks and benefits, the patient                         was deemed in satisfactory condition to undergo the                         procedure.  After obtaining informed consent, the colonoscope was                         passed under direct vision. Throughout the procedure,                         the patient's blood pressure, pulse, and oxygen                          saturations were monitored continuously. The                         Colonoscope was introduced through the anus and                         advanced to the the cecum, identified by appendiceal                         orifice and ileocecal valve. The colonoscopy was                         performed without difficulty. The patient tolerated                         the procedure well. The quality of the bowel                         preparation was excellent. Findings:      The perianal and digital rectal examinations were normal.      Two sessile polyps were found in the descending colon. The polyps were 2       to 3 mm in size. These polyps were removed with a cold biopsy forceps.       Resection and retrieval were complete.      Multiple small-mouthed diverticula were found in the sigmoid colon.      Non-bleeding internal hemorrhoids were found during retroflexion. The       hemorrhoids were Grade I (internal hemorrhoids that do not prolapse). Impression:            - Two 2 to 3 mm polyps in the descending colon,                         removed with a cold biopsy forceps. Resected and                         retrieved.                        - Diverticulosis in the sigmoid colon.                        - Non-bleeding internal hemorrhoids. Recommendation:        - Discharge patient to home.                        - Resume previous diet.                        - Continue present medications.                        -  Await pathology results.                        - Repeat colonoscopy is not recommended for                         surveillance. Procedure Code(s):     --- Professional ---                        [phone], Colonoscopy, flexible; with biopsy, single or                         multiple Diagnosis Code(s):     --- Professional ---                        Z86.010, Personal history of colonic polyps                        K63.5, Polyp of colon CPT copyright 2019 American Medical  Association. All rights reserved. The codes documented in this report are preliminary and upon coder review may  be revised to meet current compliance requirements. [name] MD, MD 03/21/2022 11:11:11 AM This report has been signed electronically. Number of Addenda: 0 Note Initiated On: 03/21/2022 10:53 AM Scope Withdrawal Time: 0 hours 6 minutes 18 seconds  Total Procedure Duration: 0 hours 11 minutes 31 seconds  Estimated Blood Loss:  Estimated blood loss: none.      [facility]

## 2022-03-21 NOTE — H&P (Signed)
Taylor Lame, MD [address] [place], [place] [address] Phone:[phone] Fax : [phone]  Primary Care Physician:  McLean-Scocuzza, Nino Glow, MD Primary Gastroenterologist:  Dr. Allen Norris  Pre-Procedure History & Physical: HPI:  Taylor Shaw is a 81 y.o. female is here for an colonoscopy.   Past Medical History:  Diagnosis Date   Arthritis    Diabetes ([place]) 02/19/2015   Diverticulosis 12/07/2016   Sigmoid Colon   Diverticulosis of sigmoid colon 12/08/2016   Elevated blood sugar 01/15/2015   GERD (gastroesophageal reflux disease)    Hemorrhoids    History of blood transfusion    Hyperlipidemia    Hypertension 01/15/2015   Obesity 01/15/2015   OSA on CPAP    UTI (urinary tract infection)     Past Surgical History:  Procedure Laterality Date   BOTOX INJECTION N/A 03/27/2018   Procedure: BOTOX INJECTION;  Surgeon: Jules Husbands, MD;  Location: [facility];  Service: General;  Laterality: N/A;   CATARACT EXTRACTION W/PHACO Left 05/31/2020   Procedure: CATARACT EXTRACTION PHACO AND INTRAOCULAR LENS PLACEMENT ([place]) LEFT;  Surgeon: Eulogio Bear, MD;  Location: [place];  Service: Ophthalmology;  Laterality: Left;  4.16 0:40.4   CATARACT EXTRACTION W/PHACO Right 07/05/2020   Procedure: CATARACT EXTRACTION PHACO AND INTRAOCULAR LENS PLACEMENT ([facility]) RIGHT DIABETIC 3.20 00:26.3;  Surgeon: Eulogio Bear, MD;  Location: [place];  Service: Ophthalmology;  Laterality: Right;  Diabetic - oral meds   COLONOSCOPY     COLONOSCOPY WITH PROPOFOL N/A 12/07/2016   Procedure: COLONOSCOPY WITH PROPOFOL;  Surgeon: Taylor Lame, MD;  Location: [place];  Service: Endoscopy;  Laterality: N/A;  Diabetic - oral meds   EVALUATION UNDER ANESTHESIA WITH ANAL FISSUROTOMY N/A 03/27/2018   Procedure: EXAM UNDER ANESTHESIA, CHEMICAL SPHINCTEROTOMY;  Surgeon: Jules Husbands, MD;  Location: [facility];  Service: General;  Laterality: N/A;   HEMORRHOID  SURGERY N/A 06/13/2018   Procedure: [id number];  Surgeon: Jules Husbands, MD;  Location: [facility];  Service: General;  Laterality: N/A;   JOINT REPLACEMENT     b/l hips in [place] 2004 and 2005 Dr. Alfonso Ramus    POLYPECTOMY  12/07/2016   Procedure: POLYPECTOMY;  Surgeon: Taylor Lame, MD;  Location: [facility];  Service: Endoscopy;;   RECTAL EXAM UNDER ANESTHESIA N/A 06/13/2018   Procedure: RECTAL EXAM UNDER ANESTHESIA;  Surgeon: Jules Husbands, MD;  Location: [facility];  Service: General;  Laterality: N/A;   TOTAL HIP ARTHROPLASTY Bilateral    TUBAL LIGATION      Prior to Admission medications   Medication Sig Start Date End Date Taking? Authorizing Provider  amLODipine (NORVASC) 10 MG tablet Take 1 tablet (10 mg total) by mouth daily. 02/14/22  Yes McLean-Scocuzza, Nino Glow, MD  atorvastatin (LIPITOR) 10 MG tablet Take 1 tablet (10 mg total) by mouth daily at 6 PM. 02/14/22  Yes McLean-Scocuzza, Nino Glow, MD  amoxicillin-clavulanate (AUGMENTIN) 875-125 MG tablet Take 1 tablet by mouth 2 (two) times daily. With food 02/16/22   McLean-Scocuzza, Nino Glow, MD  Blood Glucose Calibration (TRUE METRIX LEVEL 2) Normal SOLN  02/27/20   [provider]  blood glucose meter kit and supplies Dispense based on patient and insurance preference. Use up to four times daily as directed. (FOR ICD-10 E10.9, E11.9). 02/25/20   McLean-Scocuzza, Nino Glow, MD  Calcium Carb-Cholecalciferol 600-800 MG-UNIT TABS Take 1 tablet by mouth 2 (two) times daily.    [provider]  carvedilol (COREG)  25 MG tablet Take 1 tablet (25 mg total) by mouth 2 (two) times daily with a meal. 02/14/22   McLean-Scocuzza, Nino Glow, MD  docusate sodium (COLACE) 100 MG capsule Take 100 mg by mouth 2 (two) times daily as needed.    [provider]  hydrALAZINE (APRESOLINE) 10 MG tablet Take 1 tablet (10 mg total) by mouth in the morning and at bedtime. 02/14/22   McLean-Scocuzza, Nino Glow, MD  levothyroxine  (SYNTHROID) 75 MCG tablet Take 1 tablet (75 mcg total) by mouth daily before breakfast. 30 minutes before food 02/14/22   McLean-Scocuzza, Nino Glow, MD  losartan-hydrochlorothiazide (HYZAAR) 100-25 MG tablet Take 1 tablet by mouth daily. In am 02/14/22   McLean-Scocuzza, Nino Glow, MD  metFORMIN (GLUCOPHAGE) 500 MG tablet 1 pill daily in the am with food 02/14/22   McLean-Scocuzza, Nino Glow, MD  Omega-3 Fatty Acids (FISH OIL) 1000 MG CAPS Take 1,000 mg by mouth 2 (two) times daily.     [provider]  omeprazole (PRILOSEC) 20 MG capsule Take 1 capsule (20 mg total) by mouth daily. 30 min before food 02/14/22   McLean-Scocuzza, Nino Glow, MD  TRUE METRIX BLOOD GLUCOSE TEST test strip  02/27/20   [provider]  TRUEplus Lancets 33G [place]  02/27/20   [provider]    Allergies as of 02/14/2022   (No Known Allergies)    Family History  Problem Relation Age of Onset   Dementia Mother    Hypertension Mother    Arthritis Mother    Alcohol abuse Father    Cirrhosis Father    Cancer Daughter        breast   COPD Brother    Breast cancer Sister 78   Cancer Sister        ? type    Dementia Sister    Cancer Brother        ?type   Cancer Brother        cancer ?type   Cancer Daughter        cancer ?type died 40    Alzheimer's disease Sister     Social History   Socioeconomic History   Marital status: Divorced    Spouse name: Not on file   Number of children: Not on file   Years of education: Not on file   Highest education level: Not on file  Occupational History   Occupation: Astronomer covers    Comment: retired  Tobacco Use   Smoking status: Never   Smokeless tobacco: Never   Tobacco comments:    Never   Scientific laboratory technician Use: Never used  Substance and Sexual Activity   Alcohol use: No    Alcohol/week: 0.0 standard drinks of alcohol   Drug use: No   Sexual activity: Not Currently  Other Topics Concern   Not on file   Social History Narrative   Lives with daughter Taylor Shaw to go to church    Moved from [place] 3-4 years ago    No guns, wears seat belt    Never smoker    Social Determinants of Health   Financial Resource Strain: Low Risk  (05/09/2021)   Overall Financial Resource Strain (CARDIA)    Difficulty of Paying Living Expenses: Not hard at all  Food Insecurity: No Food Insecurity (05/09/2021)   Hunger Vital Sign    Worried About Running Out of Food in the Last Year: Never true  Ran Out of Food in the Last Year: Never true  Transportation Needs: No Transportation Needs (05/09/2021)   PRAPARE - Transportation    Lack of Transportation (Medical): No    Lack of Transportation (Non-Medical): No  Physical Activity: Not on file  Stress: No Stress Concern Present (05/09/2021)   [employer] - Occupational Stress Questionnaire    Feeling of Stress : Not at all  Social Connections: Unknown (05/09/2021)   Social Connection and Isolation Panel [NHANES]    Frequency of Communication with Friends and Family: More than three times a week    Frequency of Social Gatherings with Friends and Family: Once a week    Attends Religious Services: Not on file    Active Member of Clubs or Organizations: Not on file    Attends Club or Organization Meetings: Not on file    Marital Status: Not on file  Intimate Partner Violence: Not At Risk (05/09/2021)   Humiliation, Afraid, Rape, and Kick questionnaire    Fear of Current or Ex-Partner: No    Emotionally Abused: No    Physically Abused: No    Sexually Abused: No    Review of Systems: See HPI, otherwise negative ROS  Physical Exam: BP (!) 145/73   Pulse 65   Temp (!) 96.9 F (36.1 C) (Temporal)   Resp 18   Ht 5' 4" (1.626 m)   Wt 106.6 kg   SpO2 98%   BMI 40.34 kg/m  General:   Alert,  pleasant and cooperative in NAD Head:  Normocephalic and atraumatic. Neck:  Supple; no masses or thyromegaly. Lungs:  Clear  throughout to auscultation.    Heart:  Regular rate and rhythm. Abdomen:  Soft, nontender and nondistended. Normal bowel sounds, without guarding, and without rebound.   Neurologic:  Alert and  oriented x4;  grossly normal neurologically.  Impression/Plan: Taylor Shaw is here for an colonoscopy to be performed for a history of adenomatous polyps on 2018   Risks, benefits, limitations, and alternatives regarding  colonoscopy have been reviewed with the patient.  Questions have been answered.  All parties agreeable.   Darren Wohl, MD  03/21/2022, 11:12 AM 

## 2022-03-21 NOTE — Transfer of Care (Signed)
Immediate Anesthesia Transfer of Care Note  Patient: Taylor Shaw  Procedure(s) Performed: COLONOSCOPY WITH PROPOFOL  Patient Location: Endoscopy Unit  Anesthesia Type:General  Level of Consciousness: drowsy  Airway & Oxygen Therapy: Patient Spontanous Breathing  Post-op Assessment: Report given to RN and Post -op Vital signs reviewed and stable  Post vital signs: Reviewed and stable  Last Vitals:  Vitals Value Taken Time  BP    Temp    Pulse    Resp    SpO2      Last Pain:  Vitals:   03/21/22 1032  TempSrc: Temporal  PainSc: 0-No pain         Complications: No notable events documented.

## 2022-03-22 ENCOUNTER — Encounter: Payer: Self-pay | Admitting: Gastroenterology

## 2022-03-22 LAB — SURGICAL PATHOLOGY

## 2022-03-23 ENCOUNTER — Encounter: Payer: Self-pay | Admitting: Gastroenterology

## 2022-03-28 ENCOUNTER — Encounter: Payer: Self-pay | Admitting: Adult Health

## 2022-03-28 ENCOUNTER — Ambulatory Visit: Payer: Medicare HMO | Admitting: Adult Health

## 2022-03-28 ENCOUNTER — Telehealth: Payer: Self-pay

## 2022-03-28 DIAGNOSIS — Z6841 Body Mass Index (BMI) 40.0 and over, adult: Secondary | ICD-10-CM | POA: Diagnosis not present

## 2022-03-28 DIAGNOSIS — G4733 Obstructive sleep apnea (adult) (pediatric): Secondary | ICD-10-CM | POA: Diagnosis not present

## 2022-03-28 NOTE — Patient Instructions (Signed)
Keep up good work  Work on [employer] loss Do not drive if sleepy  Follow up in 1 year with Dr. Mortimer Fries and As needed

## 2022-03-28 NOTE — Assessment & Plan Note (Signed)
Healthy weight loss 

## 2022-03-28 NOTE — Assessment & Plan Note (Signed)
Excellent control and compliance on CPAP   Plan  Patient Instructions  Keep up good work  Work on healthy weight loss Do not drive if sleepy  Follow up in 1 year with Dr. Mortimer Fries and As needed

## 2022-03-28 NOTE — Progress Notes (Signed)
@Patient  ID: Taylor Shaw, female    DOB: [birth year], 81 y.o.   MRN: [id number]  Chief Complaint  Patient presents with   Follow-up    Referring provider: McLean-Scocuzza, Olivia Mackie *  HPI: 81 year old female followed for obstructive sleep apnea on nocturnal CPAP  TEST/EVENTS :  CPAP download data 01/10/2018-04/09/2018.>>  Raw data personally reviewed uses greater than 4 hours is 86/90 days.  Average usage on days used 7 hours 20 minutes.  Set pressure is 16 CPAP.  Leaks are minimal.  Residual AHI 0.6.  Overall this shows very good compliance with CPAP with excellent control of obstructive sleep apnea. **CPAP download 09/11/2018- 12/09/2018>> raw data personally reviewed.  Usage greater than 4 hours 85/90 days.  Average usage on days used is 8 hours 31 minutes.  Set pressure 16, leaks are within normal limits, residual AHI 0.4.  Overall this shows very good compliance with CPAP with excellent control obstructive sleep apnea.   Review of outside sleep studies and outside records including office visit notes from [facility] and sleep [facility] of [place] received and reviewed, 32 minutes spent, summarized below. **CPAP titration study 01/25/2011 CPAP titrated to 16. **Baseline PSG 12/14/2010: Severe obstructive sleep apnea with AHI of 39.  CPAP titration was recommended.  New CPAP machine 01/2022- uses full face mask.   03/28/2022 Follow up : OSA  Patient presents for 72-monthfollow-up.  Patient has underlying severe sleep apnea.  She is on nocturnal CPAP.  Wears her CPAP every single night anywhere from 8 to 9 hours.  She feels rested and feels she benefits from CPAP with decreased daytime sleepiness.  She is recently got a new CPAP machine.  CPAP download shows 100% compliance , daily average usage at 11hr. AHI 0.5/hr . On CPAP pressure 16cmH2O.  Flu shot and covid booster utd.   No Known Allergies  Immunization History  Administered Date(s) Administered   Fluad Quad(high Dose 65+)  02/04/2019, 05/27/2020, 05/03/2021, 02/14/2022   Influenza, High Dose Seasonal PF 02/19/2015, 04/03/2018   Influenza-Unspecified 03/16/2014, 02/27/2017   PFIZER(Purple Top)SARS-COV-2 Vaccination 07/09/2019, 07/30/2019   Pfizer Covid-19 Vaccine Bivalent Booster 158yr& up 01/07/2021   Pneumococcal Conjugate-13 07/04/2018   Pneumococcal-Unspecified 03/16/2014   Tdap 07/04/2018, 03/28/2019   Zoster Recombinat (Shingrix) 07/04/2018, 03/10/2019, 03/28/2019   Zoster, Live 04/16/2013    Past Medical History:  Diagnosis Date   Arthritis    [place]   Diverticulosis 12/07/2016   Sigmoid Colon   Diverticulosis of sigmoid colon 12/08/2016   Elevated blood sugar 01/15/2015   GERD (gastroesophageal reflux disease)    Hemorrhoids    History of blood transfusion    Hyperlipidemia    Hypertension 01/15/2015   Obesity 01/15/2015   OSA on CPAP    UTI (urinary tract infection)     Tobacco History: Social History   Tobacco Use  Smoking Status Never  Smokeless Tobacco Never  Tobacco Comments   Never    Counseling given: Not Answered Tobacco comments: Never    Outpatient Medications Prior to Visit  Medication Sig Dispense Refill   amLODipine (NORVASC) 10 MG tablet Take 1 tablet (10 mg total) by mouth daily. 90 tablet 3   atorvastatin (LIPITOR) 10 MG tablet Take 1 tablet (10 mg total) by mouth daily at 6 PM. 90 tablet 3   Blood Glucose Calibration (TRUE METRIX LEVEL 2) Normal SOLN      blood glucose meter kit and supplies Dispense based on patient and insurance preference. Use up to four  times daily as directed. (FOR ICD-10 E10.9, E11.9). 1 each 0   carvedilol (COREG) 25 MG tablet Take 1 tablet (25 mg total) by mouth 2 (two) times daily with a meal. 180 tablet 3   docusate sodium (COLACE) 100 MG capsule Take 100 mg by mouth 2 (two) times daily as needed.     hydrALAZINE (APRESOLINE) 10 MG tablet Take 1 tablet (10 mg total) by mouth in the morning and at bedtime. 180  tablet 3   levothyroxine (SYNTHROID) 75 MCG tablet Take 1 tablet (75 mcg total) by mouth daily before breakfast. 30 minutes before food 90 tablet 3   losartan-hydrochlorothiazide (HYZAAR) 100-25 MG tablet Take 1 tablet by mouth daily. In am 90 tablet 3   metFORMIN (GLUCOPHAGE) 500 MG tablet 1 pill daily in the am with food 90 tablet 3   omeprazole (PRILOSEC) 20 MG capsule Take 1 capsule (20 mg total) by mouth daily. 30 min before food 90 capsule 3   TRUE METRIX BLOOD GLUCOSE TEST test strip      TRUEplus Lancets 33G MISC      amoxicillin-clavulanate (AUGMENTIN) 875-125 MG tablet Take 1 tablet by mouth 2 (two) times daily. With food 14 tablet 0   Calcium Carb-Cholecalciferol 600-800 MG-UNIT TABS Take 1 tablet by mouth 2 (two) times daily. (Patient not taking: Reported on 03/28/2022)     Omega-3 Fatty Acids (FISH OIL) 1000 MG CAPS Take 1,000 mg by mouth 2 (two) times daily.  (Patient not taking: Reported on 03/28/2022)     No facility-administered medications prior to visit.     Review of Systems:   Constitutional:   No  weight loss, night sweats,  Fevers, chills, fatigue, or  lassitude.  HEENT:   No headaches,  Difficulty swallowing,  Tooth/dental problems, or  Sore throat,                No sneezing, itching, ear ache, nasal congestion, post nasal drip,   CV:  No chest pain,  Orthopnea, PND, swelling in lower extremities, anasarca, dizziness, palpitations, syncope.   GI  No heartburn, indigestion, abdominal pain, nausea, vomiting, diarrhea, change in bowel habits, loss of appetite, bloody stools.   Resp: No shortness of breath with exertion or at rest.  No excess mucus, no productive cough,  No non-productive cough,  No coughing up of blood.  No change in color of mucus.  No wheezing.  No chest wall deformity  Skin: no rash or lesions.  GU: no dysuria, change in color of urine, no urgency or frequency.  No flank pain, no hematuria   MS:  No joint pain or swelling.  No decreased range  of motion.  No back pain.    Physical Exam  BP 138/82 (BP Location: Left Arm, Cuff Size: Large)   Pulse 60   Temp 97.9 F (36.6 C) (Temporal)   Ht 5' 4"  (1.626 m)   Wt 241 lb 3.2 oz (109.4 kg)   SpO2 97%   BMI 41.40 kg/m   GEN: A/Ox3; pleasant , NAD, well nourished    HEENT:  [place]/AT,   NOSE-clear, THROAT-clear, no lesions, no postnasal drip or exudate noted.  Class 3 MP airway   NECK:  Supple w/ fair ROM; no JVD; normal carotid impulses w/o bruits; no thyromegaly or nodules palpated; no lymphadenopathy.    RESP  Clear  P & A; w/o, wheezes/ rales/ or rhonchi. no accessory muscle use, no dullness to percussion  CARD:  RRR, no m/r/g, tr peripheral edema,  pulses intact, no cyanosis or clubbing.  GI:   Soft & nt; nml bowel sounds; no organomegaly or masses detected.   Musco: Warm bil, no deformities or joint swelling noted.   Neuro: alert, no focal deficits noted.    Skin: Warm, no lesions or rashes    Lab Results:  CBC    Component Value Date/Time   WBC 8.0 02/14/2022 1020   RBC 4.62 02/14/2022 1020   HGB 12.2 02/14/2022 1020   HCT 37.5 02/14/2022 1020   PLT 235.0 02/14/2022 1020   MCV 81.3 02/14/2022 1020   MCH 25.4 (L) 03/25/2018 1353   MCHC 32.5 02/14/2022 1020   RDW 14.1 02/14/2022 1020   LYMPHSABS 2.7 02/14/2022 1020   MONOABS 0.5 02/14/2022 1020   EOSABS 0.2 02/14/2022 1020   BASOSABS 0.1 02/14/2022 1020    BMET    Component Value Date/Time   NA 140 02/14/2022 1020   NA 143 01/19/2015 0811   K 3.6 02/14/2022 1020   CL 99 02/14/2022 1020   CO2 32 02/14/2022 1020   GLUCOSE 94 02/14/2022 1020   BUN 22 02/14/2022 1020   BUN 9 01/19/2015 0811   CREATININE 0.90 02/14/2022 1020   CALCIUM 9.6 02/14/2022 1020   GFRNONAA >60 03/25/2018 1353   GFRAA >60 03/25/2018 1353    BNP No results found for: "BNP"  ProBNP No results found for: "PROBNP"  Imaging: No results found.        No data to display          No results found for:  "NITRICOXIDE"      Assessment & Plan:   No problem-specific Assessment & Plan notes found for this encounter.     Rexene Edison, NP 03/28/2022

## 2022-05-24 ENCOUNTER — Ambulatory Visit (INDEPENDENT_AMBULATORY_CARE_PROVIDER_SITE_OTHER): Payer: Medicare HMO | Admitting: Family Medicine

## 2022-05-24 ENCOUNTER — Encounter: Payer: Self-pay | Admitting: Family Medicine

## 2022-05-24 VITALS — BP 126/82 | HR 54 | Temp 98.1°F | Ht 64.0 in | Wt 238.6 lb

## 2022-05-24 DIAGNOSIS — E119 Type 2 diabetes mellitus without complications: Secondary | ICD-10-CM | POA: Diagnosis not present

## 2022-05-24 DIAGNOSIS — E1159 Type 2 diabetes mellitus with other circulatory complications: Secondary | ICD-10-CM | POA: Diagnosis not present

## 2022-05-24 DIAGNOSIS — E785 Hyperlipidemia, unspecified: Secondary | ICD-10-CM

## 2022-05-24 DIAGNOSIS — I152 Hypertension secondary to endocrine disorders: Secondary | ICD-10-CM

## 2022-05-24 DIAGNOSIS — E039 Hypothyroidism, unspecified: Secondary | ICD-10-CM | POA: Diagnosis not present

## 2022-05-24 DIAGNOSIS — Z6841 Body Mass Index (BMI) 40.0 and over, adult: Secondary | ICD-10-CM

## 2022-05-24 DIAGNOSIS — Z23 Encounter for immunization: Secondary | ICD-10-CM | POA: Diagnosis not present

## 2022-05-24 LAB — MICROALBUMIN / CREATININE URINE RATIO
Creatinine,U: 25.5 mg/dL
Microalb Creat Ratio: 2.7 mg/g (ref 0.0–30.0)
Microalb, Ur: <0.7 mg/dL (ref 0.0–1.9)

## 2022-05-24 NOTE — Progress Notes (Signed)
Please send letter to patient with normal albumin/cr ratio  [name], MD

## 2022-05-24 NOTE — Progress Notes (Signed)
SUBJECTIVE:   Chief Complaint  Patient presents with   Establish Care    Transfer of Care    HPI Patient presents to clinic to transfer care.  No acute concerns today.  Hypertension Asymptomatic.  Currently taking amlodipine 10 mg daily, hydralazine 10 mg twice daily, carvedilol 25 mg twice daily, Hyzaar 100-25 mg daily.  Follows with cardiology.  Diabetes type 2. Asymptomatic.  Tolerating metformin 500 mg daily.  Recent A1c 6.4.  Hyperlipidemia Taking Lipitor 10 mg daily and tolerating well.  No myalgias.   PERTINENT PMH / PSH: Diabetes type 2 Hypertension Hyperlipidemia Hypothyroidism   OBJECTIVE:  BP 126/82   Pulse (!) 54   Temp 98.1 F (36.7 C)   Ht '5\' 4"'$  (1.626 m)   Wt 238 lb 9.6 oz (108.2 kg)   SpO2 98%   BMI 40.96 kg/m    Physical Exam Vitals reviewed.  Constitutional:      General: She is not in acute distress.    Appearance: She is normal weight. She is not ill-appearing.  HENT:     Head: Normocephalic.  Eyes:     Conjunctiva/sclera: Conjunctivae normal.  Cardiovascular:     Rate and Rhythm: Normal rate and regular rhythm.     Pulses: Normal pulses.  Pulmonary:     Effort: Pulmonary effort is normal.     Breath sounds: Normal breath sounds.  Abdominal:     General: Bowel sounds are normal.  Neurological:     Mental Status: She is alert. Mental status is at baseline.  Psychiatric:        Mood and Affect: Mood normal.        Behavior: Behavior normal.        Thought Content: Thought content normal.        Judgment: Judgment normal.     ASSESSMENT/PLAN:  Type 2 diabetes mellitus without complication, without long-term current use of insulin (HCC) Assessment & Plan: Chronic.  Well-controlled.  Recent A1c 6.4.  Tolerating metformin well. Continue metformin 500 mg daily Monitor B12 levels Continue statin therapy Continue ARB therapy Follow-up in 6 months.  Orders: -     Microalbumin / creatinine urine ratio -     Hemoglobin A1c;  Future -     VITAMIN D 25 Hydroxy (Vit-D Deficiency, Fractures); Future -     Vitamin B12; Future  Hypertension associated with diabetes ([place]) Assessment & Plan: Chronic.  Stable.  DM type II well-controlled, recent A1c 6.4.  Currently on 5 antihypertensive medications.  Denies any previous workup for secondary causes.  Per JNC 8 guidelines goal BP less than 140/90.  Would be cautious with multiple hypertensive medications and increasing age.   Continue amlodipine 10 mg daily Continue carvedilol 25 mg twice daily Continue hydralazine 10 mg twice daily, consider weaning in the future Consider aldosterone antagonist if needed Continue Hyzaar 100-25 mg daily Follows with cardiology. Consider hypertensive clinic at next visit   Orders: -     Comprehensive metabolic panel; Future  Morbid obesity with BMI of 40.0-44.9, adult ([place]) Assessment & Plan: BMI remains elevated.  Discussed healthy lifestyle choices.    Hypothyroidism, unspecified type Assessment & Plan: Chronic.  Asymptomatic.  Recent TSH elevated 5.70.  Patient reports compliancy with medication. Continue levothyroxine 75 mcg daily. Repeat levels today.  Orders: -     Thyroid Panel With TSH -     TSH; Future  Hyperlipidemia, unspecified hyperlipidemia type Assessment & Plan: Chronic.  Stable.  On statin therapy and tolerating  well.  No myalgias. Continue Lipitor 10 mg daily.  Orders: -     Lipid panel; Future  Need for pneumococcal 20-valent conjugate vaccination -     Pneumococcal conjugate vaccine 20-valent  HCM Colonoscopy 10/23.  Repeat colonoscopy not recommended for surveillance. Diabetic eye exam up-to-date Diabetic foot exam up-to-date Received pneumonia 20 vaccine today.   PDMP reviewed  Return in about 6 months (around 11/23/2022) for annual visit with fasting labs 1 week prior.  [name], MD

## 2022-05-24 NOTE — Patient Instructions (Signed)
It was a pleasure meeting you today. Thank you for allowing me to take part in your health care.  Our goals for today as we discussed include:  Your last thyroid level was slightly elevated.  Will repeat levels today. Continue thyroxine 75 mcg daily.  If any changes need to be made we will notify you.  You received the pneumonia 20 vaccine today.  This will be your last pneumonia vaccination.   If you have any questions or concerns, please do not hesitate to call the office at [phone].  I look forward to our next visit and until then take care and stay safe.  Regards,   [name], MD   [facility]

## 2022-05-25 LAB — THYROID PANEL WITH TSH
Free Thyroxine Index: 3.4 (ref 1.4–3.8)
T3 Uptake: 29 % (ref 22–35)
T4, Total: 11.8 ug/dL (ref 5.1–11.9)
TSH: 2.41 mIU/L (ref 0.40–4.50)

## 2022-05-30 ENCOUNTER — Telehealth: Payer: Self-pay | Admitting: Family Medicine

## 2022-05-30 NOTE — Telephone Encounter (Signed)
Sched for 12/29 w/ Ollen Gross Advised if pain worsens must be evaluated at [facility] / ED Pt daughter agrees

## 2022-05-30 NOTE — Telephone Encounter (Signed)
Pt called stating she is having jaw pain. Pt states it feels like a sharp pen is sticking in it. Sent to access nurse

## 2022-06-01 ENCOUNTER — Ambulatory Visit: Payer: Medicare HMO | Admitting: Podiatry

## 2022-06-01 ENCOUNTER — Encounter: Payer: Self-pay | Admitting: Podiatry

## 2022-06-01 DIAGNOSIS — B351 Tinea unguium: Secondary | ICD-10-CM | POA: Diagnosis not present

## 2022-06-01 DIAGNOSIS — M79675 Pain in left toe(s): Secondary | ICD-10-CM | POA: Diagnosis not present

## 2022-06-01 DIAGNOSIS — M79674 Pain in right toe(s): Secondary | ICD-10-CM | POA: Diagnosis not present

## 2022-06-01 DIAGNOSIS — E119 Type 2 diabetes mellitus without complications: Secondary | ICD-10-CM

## 2022-06-01 NOTE — Progress Notes (Signed)
This patient returns to my office for at risk foot care.  This patient requires this care by a professional since this patient will be at risk due to having diabetic neuropathy.  This patient is unable to cut nails herself since the patient cannot reach hernails.These nails are painful walking and wearing shoes.  This patient presents for at risk foot care today.  General Appearance  Alert, conversant and in no acute stress.  Vascular  Dorsalis pedis and posterior tibial  pulses are palpable  bilaterally.  Capillary return is within normal limits  bilaterally. Temperature is within normal limits  bilaterally.  Neurologic  Senn-Weinstein monofilament wire test diminished   bilaterally. Muscle power within normal limits bilaterally.  Nails Thick disfigured discolored nails with subungual debris  from hallux to fifth toes bilaterally. No evidence of bacterial infection or drainage bilaterally.  Orthopedic  No limitations of motion  feet .  No crepitus or effusions noted.  No bony pathology or digital deformities noted.  Skin  normotropic skin with no porokeratosis noted bilaterally.  No signs of infections or ulcers noted.     Onychomycosis  Pain in right toes  Pain in left toes  Consent was obtained for treatment procedures.   Mechanical debridement of nails 1-5  bilaterally performed with a nail nipper.  Filed with dremel without incident.    Return office visit   3 months                   Told patient to return for periodic foot care and evaluation due to potential at risk complications.   Gardiner Barefoot DPM

## 2022-06-02 ENCOUNTER — Ambulatory Visit (INDEPENDENT_AMBULATORY_CARE_PROVIDER_SITE_OTHER): Payer: Medicare HMO | Admitting: Nurse Practitioner

## 2022-06-02 ENCOUNTER — Encounter: Payer: Self-pay | Admitting: Nurse Practitioner

## 2022-06-02 VITALS — BP 140/70 | HR 54 | Temp 98.6°F | Ht 64.0 in | Wt 233.2 lb

## 2022-06-02 DIAGNOSIS — K047 Periapical abscess without sinus: Secondary | ICD-10-CM | POA: Diagnosis not present

## 2022-06-02 HISTORY — DX: Periapical abscess without sinus: K04.7

## 2022-06-02 MED ORDER — AMOXICILLIN-POT CLAVULANATE 875-125 MG PO TABS: 1.0000 | ORAL_TABLET | Freq: Two times a day (BID) | ORAL | 0 refills | Status: DC

## 2022-06-02 NOTE — Patient Instructions (Addendum)
It was nice to meet you!  I am sending antibiotics for you to take twice daily x 10 days.  Please schedule an appointment with your dentist for follow up.   You can take Tylenol/Ibuprofen for pain.

## 2022-06-02 NOTE — Progress Notes (Signed)
Tomasita Morrow, NP-C Phone: [phone]  CORRISA GIBBY is a 81 y.o. female who presents today for jaw pain.  She reports worsening left sided tooth and jaw pain x 8 days. She is concerned that she has an abscessed tooth. She reports the pain extends from her lower jaw to her forehead on the left side. She has pain with chewing and talking. She has sensitivity with drinking liquids. She denies any fevers or chills. Denies chest pain or shortness of breath.   Social History   Tobacco Use  Smoking Status Never  Smokeless Tobacco Never  Tobacco Comments   Never     Current Outpatient Medications on File Prior to Visit  Medication Sig Dispense Refill   amLODipine (NORVASC) 10 MG tablet Take 1 tablet (10 mg total) by mouth daily. 90 tablet 3   atorvastatin (LIPITOR) 10 MG tablet Take 1 tablet (10 mg total) by mouth daily at 6 PM. 90 tablet 3   Blood Glucose Calibration (TRUE METRIX LEVEL 2) Normal SOLN      blood glucose meter kit and supplies Dispense based on patient and insurance preference. Use up to four times daily as directed. (FOR ICD-10 E10.9, E11.9). 1 each 0   Calcium Carb-Cholecalciferol 600-800 MG-UNIT TABS Take 1 tablet by mouth 2 (two) times daily.     carvedilol (COREG) 25 MG tablet Take 1 tablet (25 mg total) by mouth 2 (two) times daily with a meal. 180 tablet 3   docusate sodium (COLACE) 100 MG capsule Take 100 mg by mouth 2 (two) times daily as needed.     hydrALAZINE (APRESOLINE) 10 MG tablet Take 1 tablet (10 mg total) by mouth in the morning and at bedtime. 180 tablet 3   levothyroxine (SYNTHROID) 75 MCG tablet Take 1 tablet (75 mcg total) by mouth daily before breakfast. 30 minutes before food 90 tablet 3   losartan-hydrochlorothiazide (HYZAAR) 100-25 MG tablet Take 1 tablet by mouth daily. In am 90 tablet 3   metFORMIN (GLUCOPHAGE) 500 MG tablet 1 pill daily in the am with food 90 tablet 3   Omega-3 Fatty Acids (FISH OIL) 1000 MG CAPS Take 1,000 mg by mouth 2 (two)  times daily.     omeprazole (PRILOSEC) 20 MG capsule Take 1 capsule (20 mg total) by mouth daily. 30 min before food 90 capsule 3   TRUE METRIX BLOOD GLUCOSE TEST test strip      TRUEplus Lancets 33G MISC      No current facility-administered medications on file prior to visit.    ROS see history of present illness  Objective  Physical Exam Vitals:   06/02/22 1540 06/02/22 1558  BP: (!) 142/72 (!) 140/70  Pulse: (!) 54   Temp: 98.6 F (37 C)   SpO2: 94%     BP Readings from Last 3 Encounters:  06/02/22 (!) 140/70  05/24/22 126/82  03/28/22 138/82   Wt Readings from Last 3 Encounters:  06/02/22 233 lb 3.2 oz (105.8 kg)  05/24/22 238 lb 9.6 oz (108.2 kg)  03/28/22 241 lb 3.2 oz (109.4 kg)    Physical Exam Constitutional:      General: She is not in acute distress.    Appearance: Normal appearance.  HENT:     Head: Normocephalic.     Jaw: Tenderness (left side) present.     Mouth/Throat:     Mouth: Mucous membranes are moist.     Dentition: Dental tenderness, gingival swelling, dental caries and dental abscesses (left side- lower  back molar) present.     Pharynx: Oropharynx is clear.  Cardiovascular:     Rate and Rhythm: Normal rate and regular rhythm.     Heart sounds: Normal heart sounds.  Pulmonary:     Effort: Pulmonary effort is normal.     Breath sounds: Normal breath sounds.  Neurological:     Mental Status: She is alert.    Assessment/Plan: Please see individual problem list.  Dental abscess Assessment & Plan: Augmentin 875 BID x 10 days. Encouraged patient to schedule appointment with dentist as soon as possible. Advised she can take Tylenol and Ibuprofen for pain.   Orders: -     Amoxicillin-Pot Clavulanate; Take 1 tablet by mouth 2 (two) times daily.  Dispense: 20 tablet; Refill: 0   Return if symptoms worsen or fail to improve.   Tomasita Morrow, NP-C [place]

## 2022-06-02 NOTE — Assessment & Plan Note (Signed)
Augmentin 875 BID x 10 days. Encouraged patient to schedule appointment with dentist as soon as possible. Advised she can take Tylenol and Ibuprofen for pain.

## 2022-06-08 ENCOUNTER — Other Ambulatory Visit: Payer: Self-pay

## 2022-06-08 ENCOUNTER — Telehealth: Payer: Self-pay

## 2022-06-08 DIAGNOSIS — I10 Essential (primary) hypertension: Secondary | ICD-10-CM

## 2022-06-08 NOTE — Progress Notes (Unsigned)
Orders pended for refills per Patient and Pharmacy was changed

## 2022-06-08 NOTE — Telephone Encounter (Signed)
Attempted to call Patient to find out what refills she needs and to see if she switched her Pharmacy to [facility]?

## 2022-06-08 NOTE — Telephone Encounter (Signed)
Pt called back. Note was read to her. Transferred to [place].

## 2022-06-09 ENCOUNTER — Encounter: Payer: Self-pay | Admitting: Family Medicine

## 2022-06-09 NOTE — Assessment & Plan Note (Signed)
Chronic.  Stable.  On statin therapy and tolerating well.  No myalgias. Continue Lipitor 10 mg daily.

## 2022-06-09 NOTE — Assessment & Plan Note (Addendum)
Chronic.  Asymptomatic.  Recent TSH elevated 5.70.  Patient reports compliancy with medication. Continue levothyroxine 75 mcg daily. Repeat levels today.

## 2022-06-09 NOTE — Assessment & Plan Note (Signed)
BMI remains elevated.  Discussed healthy lifestyle choices.

## 2022-06-09 NOTE — Assessment & Plan Note (Signed)
Chronic.  Well-controlled.  Recent A1c 6.4.  Tolerating metformin well. Continue metformin 500 mg daily Monitor B12 levels Continue statin therapy Continue ARB therapy Follow-up in 6 months.

## 2022-06-09 NOTE — Assessment & Plan Note (Signed)
Chronic.  Stable.  DM type II well-controlled, recent A1c 6.4.  Currently on 5 antihypertensive medications.  Denies any previous workup for secondary causes.  Per JNC 8 guidelines goal BP less than 140/90.  Would be cautious with multiple hypertensive medications and increasing age.   Continue amlodipine 10 mg daily Continue carvedilol 25 mg twice daily Continue hydralazine 10 mg twice daily, consider weaning in the future Consider aldosterone antagonist if needed Continue Hyzaar 100-25 mg daily Follows with cardiology. Consider hypertensive clinic at next visit

## 2022-06-14 DIAGNOSIS — G4733 Obstructive sleep apnea (adult) (pediatric): Secondary | ICD-10-CM | POA: Diagnosis not present

## 2022-06-14 DIAGNOSIS — I10 Essential (primary) hypertension: Secondary | ICD-10-CM | POA: Diagnosis not present

## 2022-07-07 ENCOUNTER — Other Ambulatory Visit: Payer: Self-pay

## 2022-07-07 ENCOUNTER — Telehealth: Payer: Self-pay

## 2022-07-07 DIAGNOSIS — E785 Hyperlipidemia, unspecified: Secondary | ICD-10-CM

## 2022-07-07 DIAGNOSIS — K219 Gastro-esophageal reflux disease without esophagitis: Secondary | ICD-10-CM

## 2022-07-07 DIAGNOSIS — I10 Essential (primary) hypertension: Secondary | ICD-10-CM

## 2022-07-07 MED ORDER — OMEPRAZOLE 20 MG PO CPDR: 20.0000 mg | DELAYED_RELEASE_CAPSULE | Freq: Every day | ORAL | 3 refills | Status: DC

## 2022-07-07 MED ORDER — LOSARTAN POTASSIUM-HCTZ 100-25 MG PO TABS: 1.0000 | ORAL_TABLET | Freq: Every day | ORAL | 3 refills | Status: DC

## 2022-07-07 MED ORDER — HYDRALAZINE HCL 10 MG PO TABS: 10.0000 mg | ORAL_TABLET | Freq: Two times a day (BID) | ORAL | 3 refills | Status: DC

## 2022-07-07 MED ORDER — AMLODIPINE BESYLATE 10 MG PO TABS: 10.0000 mg | ORAL_TABLET | Freq: Every day | ORAL | 3 refills | Status: DC

## 2022-07-07 MED ORDER — ATORVASTATIN CALCIUM 10 MG PO TABS: 10.0000 mg | ORAL_TABLET | Freq: Every day | ORAL | 3 refills | Status: DC

## 2022-07-07 MED ORDER — CARVEDILOL 25 MG PO TABS: 25.0000 mg | ORAL_TABLET | Freq: Two times a day (BID) | ORAL | 3 refills | Status: DC

## 2022-07-07 NOTE — Telephone Encounter (Signed)
Prescription Request  07/07/2022  Is this a "Controlled Substance" medicine? No  LOV: Visit date not found  What is the name of the medication or equipment? amLODipine (NORVASC) 10 MG tablet, hydrALAZINE (APRESOLINE) 10 MG tablet, omeprazole (PRILOSEC) 20 MG capsule, atorvastatin (LIPITOR) 10 MG tablet, losartan-hydrochlorothiazide (HYZAAR) 100-25 MG tablet, carvedilol (COREG) 25 MG tablet.  Have you contacted your pharmacy to request a refill? No   Which pharmacy would you like this sent to?   [id number] [place] [facility] [address] [id number]: [phone]: [phone] Patient notified that their request is being sent to the clinical staff for review and that they should receive a response within 2 business days.   Please advise at Mobile [phone](mobile)   Patient states she has enough medication left for a week.  Patient [place]

## 2022-07-07 NOTE — Telephone Encounter (Signed)
sent

## 2022-07-15 DIAGNOSIS — G4733 Obstructive sleep apnea (adult) (pediatric): Secondary | ICD-10-CM | POA: Diagnosis not present

## 2022-07-15 DIAGNOSIS — I10 Essential (primary) hypertension: Secondary | ICD-10-CM | POA: Diagnosis not present

## 2022-07-19 ENCOUNTER — Other Ambulatory Visit: Payer: Self-pay | Admitting: Family Medicine

## 2022-07-19 DIAGNOSIS — Z1231 Encounter for screening mammogram for malignant neoplasm of breast: Secondary | ICD-10-CM

## 2022-08-08 DIAGNOSIS — K5900 Constipation, unspecified: Secondary | ICD-10-CM | POA: Diagnosis not present

## 2022-08-08 DIAGNOSIS — M1990 Unspecified osteoarthritis, unspecified site: Secondary | ICD-10-CM | POA: Diagnosis not present

## 2022-08-08 DIAGNOSIS — Z7984 Long term (current) use of oral hypoglycemic drugs: Secondary | ICD-10-CM | POA: Diagnosis not present

## 2022-08-08 DIAGNOSIS — M069 Rheumatoid arthritis, unspecified: Secondary | ICD-10-CM | POA: Diagnosis not present

## 2022-08-08 DIAGNOSIS — E785 Hyperlipidemia, unspecified: Secondary | ICD-10-CM | POA: Diagnosis not present

## 2022-08-08 DIAGNOSIS — R32 Unspecified urinary incontinence: Secondary | ICD-10-CM | POA: Diagnosis not present

## 2022-08-08 DIAGNOSIS — I10 Essential (primary) hypertension: Secondary | ICD-10-CM | POA: Diagnosis not present

## 2022-08-08 DIAGNOSIS — Z008 Encounter for other general examination: Secondary | ICD-10-CM | POA: Diagnosis not present

## 2022-08-08 DIAGNOSIS — G4733 Obstructive sleep apnea (adult) (pediatric): Secondary | ICD-10-CM | POA: Diagnosis not present

## 2022-08-08 DIAGNOSIS — E119 Type 2 diabetes mellitus without complications: Secondary | ICD-10-CM | POA: Diagnosis not present

## 2022-08-08 DIAGNOSIS — K219 Gastro-esophageal reflux disease without esophagitis: Secondary | ICD-10-CM | POA: Diagnosis not present

## 2022-08-08 DIAGNOSIS — Z9181 History of falling: Secondary | ICD-10-CM | POA: Diagnosis not present

## 2022-08-10 ENCOUNTER — Telehealth: Payer: Self-pay | Admitting: Family Medicine

## 2022-08-10 NOTE — Telephone Encounter (Signed)
Contacted Taylor Shaw to schedule their annual wellness visit. Patient declined to schedule AWV at this time.  Patient had AWV done by Insurance yesterday.  Thank you,  Emma Direct dial  [phone]

## 2022-08-13 DIAGNOSIS — I10 Essential (primary) hypertension: Secondary | ICD-10-CM | POA: Diagnosis not present

## 2022-08-13 DIAGNOSIS — G4733 Obstructive sleep apnea (adult) (pediatric): Secondary | ICD-10-CM | POA: Diagnosis not present

## 2022-08-23 ENCOUNTER — Telehealth: Payer: Self-pay | Admitting: Family Medicine

## 2022-08-23 ENCOUNTER — Other Ambulatory Visit: Payer: Self-pay

## 2022-08-23 DIAGNOSIS — E119 Type 2 diabetes mellitus without complications: Secondary | ICD-10-CM

## 2022-08-23 MED ORDER — METFORMIN HCL 500 MG PO TABS: ORAL_TABLET | ORAL | 3 refills | Status: DC

## 2022-08-23 NOTE — Telephone Encounter (Signed)
Medication sent to patients pharmacy.  Taylor Shaw,cma  

## 2022-08-23 NOTE — Telephone Encounter (Signed)
Prescription Request  08/23/2022  LOV: 05/24/2022  What is the name of the medication or equipment? metFORMIN (GLUCOPHAGE) 500 MG tablet, losartan-hydrochlorothiazide (HYZAAR) 100-25 MG tablet   Have you contacted your pharmacy to request a refill? Yes   Which pharmacy would you like this sent to?   [id number] [place] - [address] [place] [address] Phone: [phone] Fax: [phone]     Patient notified that their request is being sent to the clinical staff for review and that they should receive a response within 2 business days.   Please advise at [facility] [phone]

## 2022-08-31 ENCOUNTER — Ambulatory Visit: Payer: Medicare HMO | Admitting: Podiatry

## 2022-08-31 ENCOUNTER — Encounter: Payer: Self-pay | Admitting: Podiatry

## 2022-08-31 DIAGNOSIS — M79675 Pain in left toe(s): Secondary | ICD-10-CM | POA: Diagnosis not present

## 2022-08-31 DIAGNOSIS — M79674 Pain in right toe(s): Secondary | ICD-10-CM | POA: Diagnosis not present

## 2022-08-31 DIAGNOSIS — B351 Tinea unguium: Secondary | ICD-10-CM | POA: Diagnosis not present

## 2022-08-31 DIAGNOSIS — E119 Type 2 diabetes mellitus without complications: Secondary | ICD-10-CM | POA: Diagnosis not present

## 2022-08-31 NOTE — Progress Notes (Signed)
This patient returns to my office for at risk foot care.  This patient requires this care by a professional since this patient will be at risk due to having diabetic neuropathy.  This patient is unable to cut nails herself since the patient cannot reach hernails.These nails are painful walking and wearing shoes.  This patient presents for at risk foot care today. ? ?General Appearance  Alert, conversant and in no acute stress. ? ?Vascular  Dorsalis pedis and posterior tibial  pulses are palpable  bilaterally.  Capillary return is within normal limits  bilaterally. Temperature is within normal limits  bilaterally. ? ?Neurologic  Senn-Weinstein monofilament wire test diminished   bilaterally. Muscle power within normal limits bilaterally. ? ?Nails Thick disfigured discolored nails with subungual debris  from hallux to fifth toes bilaterally. No evidence of bacterial infection or drainage bilaterally. ? ?Orthopedic  No limitations of motion  feet .  No crepitus or effusions noted.  No bony pathology or digital deformities noted. ? ?Skin  normotropic skin with no porokeratosis noted bilaterally.  No signs of infections or ulcers noted.    ? ?Onychomycosis  Pain in right toes  Pain in left toes ? ?Consent was obtained for treatment procedures.   Mechanical debridement of nails 1-5  bilaterally performed with a nail nipper.  Filed with dremel without incident.  ? ? ?Return office visit   3 months                   Told patient to return for periodic foot care and evaluation due to potential at risk complications. ? ? ?Taylor Shaw DPM   ?

## 2022-09-12 ENCOUNTER — Ambulatory Visit
Admission: RE | Admit: 2022-09-12 | Discharge: 2022-09-12 | Disposition: A | Discharge status: Home / Self Care | Payer: Medicare HMO | Source: Ambulatory Visit | Attending: Family Medicine | Admitting: Family Medicine

## 2022-09-12 DIAGNOSIS — Z1231 Encounter for screening mammogram for malignant neoplasm of breast: Secondary | ICD-10-CM | POA: Diagnosis not present

## 2022-09-13 ENCOUNTER — Other Ambulatory Visit: Payer: Self-pay | Admitting: Family Medicine

## 2022-09-13 DIAGNOSIS — G4733 Obstructive sleep apnea (adult) (pediatric): Secondary | ICD-10-CM | POA: Diagnosis not present

## 2022-09-13 DIAGNOSIS — I10 Essential (primary) hypertension: Secondary | ICD-10-CM | POA: Diagnosis not present

## 2022-09-13 DIAGNOSIS — R928 Other abnormal and inconclusive findings on diagnostic imaging of breast: Secondary | ICD-10-CM

## 2022-09-21 ENCOUNTER — Other Ambulatory Visit: Payer: Self-pay | Admitting: Family Medicine

## 2022-09-21 ENCOUNTER — Ambulatory Visit
Admission: RE | Admit: 2022-09-21 | Discharge: 2022-09-21 | Disposition: A | Discharge status: Home / Self Care | Payer: Medicare HMO | Source: Ambulatory Visit | Attending: Family Medicine | Admitting: Family Medicine

## 2022-09-21 DIAGNOSIS — R928 Other abnormal and inconclusive findings on diagnostic imaging of breast: Secondary | ICD-10-CM | POA: Insufficient documentation

## 2022-09-21 DIAGNOSIS — R92321 Mammographic fibroglandular density, right breast: Secondary | ICD-10-CM | POA: Diagnosis not present

## 2022-09-26 ENCOUNTER — Ambulatory Visit
Admission: RE | Admit: 2022-09-26 | Discharge: 2022-09-26 | Disposition: A | Discharge status: Home / Self Care | Payer: Medicare HMO | Source: Ambulatory Visit | Attending: Family Medicine | Admitting: Family Medicine

## 2022-09-26 DIAGNOSIS — R928 Other abnormal and inconclusive findings on diagnostic imaging of breast: Secondary | ICD-10-CM

## 2022-09-26 DIAGNOSIS — N6031 Fibrosclerosis of right breast: Secondary | ICD-10-CM | POA: Diagnosis not present

## 2022-09-26 HISTORY — PX: BREAST BIOPSY: SHX20

## 2022-09-26 MED ORDER — LIDOCAINE-EPINEPHRINE (PF) 1 %-1:200000 IJ SOLN
10.0000 mL | Freq: Once | INTRAMUSCULAR | Status: AC
  Administered 2022-09-26: 10 mL
  Filled 2022-09-26: qty 10

## 2022-09-26 MED ORDER — LIDOCAINE HCL (PF) 1 % IJ SOLN
15.0000 mL | Freq: Once | INTRAMUSCULAR | Status: AC
  Administered 2022-09-26: 15 mL
  Filled 2022-09-26: qty 15

## 2022-09-27 LAB — SURGICAL PATHOLOGY

## 2022-10-13 DIAGNOSIS — I10 Essential (primary) hypertension: Secondary | ICD-10-CM | POA: Diagnosis not present

## 2022-10-13 DIAGNOSIS — G4733 Obstructive sleep apnea (adult) (pediatric): Secondary | ICD-10-CM | POA: Diagnosis not present

## 2022-10-27 DIAGNOSIS — Z961 Presence of intraocular lens: Secondary | ICD-10-CM | POA: Diagnosis not present

## 2022-10-27 DIAGNOSIS — H26492 Other secondary cataract, left eye: Secondary | ICD-10-CM | POA: Diagnosis not present

## 2022-10-27 DIAGNOSIS — E119 Type 2 diabetes mellitus without complications: Secondary | ICD-10-CM | POA: Diagnosis not present

## 2022-10-27 LAB — HM DIABETES EYE EXAM

## 2022-11-13 DIAGNOSIS — G4733 Obstructive sleep apnea (adult) (pediatric): Secondary | ICD-10-CM | POA: Diagnosis not present

## 2022-11-13 DIAGNOSIS — I10 Essential (primary) hypertension: Secondary | ICD-10-CM | POA: Diagnosis not present

## 2022-11-16 ENCOUNTER — Telehealth: Payer: Self-pay

## 2022-11-16 ENCOUNTER — Other Ambulatory Visit (INDEPENDENT_AMBULATORY_CARE_PROVIDER_SITE_OTHER): Payer: Medicare HMO

## 2022-11-16 DIAGNOSIS — E119 Type 2 diabetes mellitus without complications: Secondary | ICD-10-CM

## 2022-11-16 DIAGNOSIS — E039 Hypothyroidism, unspecified: Secondary | ICD-10-CM

## 2022-11-16 DIAGNOSIS — I152 Hypertension secondary to endocrine disorders: Secondary | ICD-10-CM

## 2022-11-16 DIAGNOSIS — E1159 Type 2 diabetes mellitus with other circulatory complications: Secondary | ICD-10-CM | POA: Diagnosis not present

## 2022-11-16 DIAGNOSIS — E785 Hyperlipidemia, unspecified: Secondary | ICD-10-CM | POA: Diagnosis not present

## 2022-11-16 LAB — VITAMIN B12: Vitamin B-12: 1299 pg/mL — ABNORMAL HIGH (ref 211–911)

## 2022-11-16 LAB — LIPID PANEL
Cholesterol: 136 mg/dL (ref 0–200)
HDL: 49.8 mg/dL (ref >39.00)
LDL Cholesterol: 70 mg/dL (ref 0–99)
NonHDL: 85.96
Total CHOL/HDL Ratio: 3
Triglycerides: 80 mg/dL (ref 0.0–149.0)
VLDL: 16 mg/dL (ref 0.0–40.0)

## 2022-11-16 LAB — COMPREHENSIVE METABOLIC PANEL
ALT: 7 U/L (ref 0–35)
AST: 14 U/L (ref 0–37)
Albumin: 3.8 g/dL (ref 3.5–5.2)
Alkaline Phosphatase: 54 U/L (ref 39–117)
BUN: 13 mg/dL (ref 6–23)
CO2: 31 mEq/L (ref 19–32)
Calcium: 9.5 mg/dL (ref 8.4–10.5)
Chloride: 97 mEq/L (ref 96–112)
Creatinine, Ser: 0.82 mg/dL (ref 0.40–1.20)
GFR: 67.43 mL/min (ref >60.00)
Glucose, Bld: 94 mg/dL (ref 70–99)
Potassium: 3.5 mEq/L (ref 3.5–5.1)
Sodium: 137 mEq/L (ref 135–145)
Total Bilirubin: 0.8 mg/dL (ref 0.2–1.2)
Total Protein: 7.1 g/dL (ref 6.0–8.3)

## 2022-11-16 LAB — TSH: TSH: 3.43 u[IU]/mL (ref 0.35–5.50)

## 2022-11-16 LAB — HEMOGLOBIN A1C: Hgb A1c MFr Bld: 6 % (ref 4.6–6.5)

## 2022-11-16 LAB — VITAMIN D 25 HYDROXY (VIT D DEFICIENCY, FRACTURES): VITD: 87.79 ng/mL (ref 30.00–100.00)

## 2022-11-16 NOTE — Telephone Encounter (Signed)
Pt returned Shanda Bumps CMA call. Note below was read to her. As per pt, she's only taking vitamin d3, and calcium.

## 2022-11-16 NOTE — Telephone Encounter (Signed)
LMTCB in regards to lab results.  

## 2022-11-16 NOTE — Telephone Encounter (Signed)
-----   Message from Dana Allan, MD sent at 11/16/2022 12:18 PM EDT ----- Vitamin B 12 elevated. If taking  multivitamins or B supplements, can take 2-3 times a week  All other blood work acceptable

## 2022-11-23 ENCOUNTER — Encounter: Payer: Self-pay | Admitting: Family Medicine

## 2022-11-23 ENCOUNTER — Ambulatory Visit (INDEPENDENT_AMBULATORY_CARE_PROVIDER_SITE_OTHER): Payer: Medicare HMO | Admitting: Family Medicine

## 2022-11-23 VITALS — BP 134/78 | HR 47 | Temp 98.0°F | Ht 64.0 in | Wt 231.0 lb

## 2022-11-23 DIAGNOSIS — E039 Hypothyroidism, unspecified: Secondary | ICD-10-CM | POA: Diagnosis not present

## 2022-11-23 DIAGNOSIS — Z7984 Long term (current) use of oral hypoglycemic drugs: Secondary | ICD-10-CM | POA: Diagnosis not present

## 2022-11-23 DIAGNOSIS — E118 Type 2 diabetes mellitus with unspecified complications: Secondary | ICD-10-CM

## 2022-11-23 DIAGNOSIS — E1159 Type 2 diabetes mellitus with other circulatory complications: Secondary | ICD-10-CM

## 2022-11-23 DIAGNOSIS — Z0000 Encounter for general adult medical examination without abnormal findings: Secondary | ICD-10-CM

## 2022-11-23 DIAGNOSIS — Z6841 Body Mass Index (BMI) 40.0 and over, adult: Secondary | ICD-10-CM

## 2022-11-23 DIAGNOSIS — I152 Hypertension secondary to endocrine disorders: Secondary | ICD-10-CM

## 2022-11-23 DIAGNOSIS — R001 Bradycardia, unspecified: Secondary | ICD-10-CM

## 2022-11-23 MED ORDER — LEVOTHYROXINE SODIUM 75 MCG PO TABS: 75.0000 ug | ORAL_TABLET | Freq: Every day | ORAL | 3 refills | Status: DC

## 2022-11-23 NOTE — Progress Notes (Signed)
SUBJECTIVE:   Chief Complaint  Patient presents with   Annual Exam    Physical    HPI Patient presents to clinic for annual physical  No acute concerns today.  Denies any SI/HI No recent falls  Hypertension Asymptomatic.  Doing well on  amlodipine 10 mg daily, hydralazine 10 mg twice daily, carvedilol 25 mg twice daily, Hyzaar 100-25 mg daily.  Follows with cardiology.  Diabetes type 2. Asymptomatic.  Tolerating metformin 500 mg daily.  Recent A1c 6.0.  Hyperlipidemia Tolerating Lipitor 10 mg daily.  No myalgias.  Hypothyroid Asymptomatic.  Recent TSH 3.43.  Tolerating Levothyroxine 75 mcg daily and requesting refills.    PERTINENT PMH / PSH: Diabetes type 2 Hypertension Hyperlipidemia Hypothyroidism   OBJECTIVE:  BP 134/78   Pulse (!) 47   Temp 98 F (36.7 C) (Oral)   Ht 5\' 4"  (1.626 m)   Wt 231 lb (104.8 kg)   SpO2 96%   BMI 39.65 kg/m    Physical Exam Vitals reviewed.  Constitutional:      General: She is not in acute distress.    Appearance: Normal appearance. She is normal weight. She is not ill-appearing, toxic-appearing or diaphoretic.  HENT:     Head: Normocephalic.  Eyes:     General:        Right eye: No discharge.        Left eye: No discharge.     Conjunctiva/sclera: Conjunctivae normal.  Neck:     Thyroid: No thyromegaly or thyroid tenderness.  Cardiovascular:     Rate and Rhythm: Normal rate and regular rhythm.     Heart sounds: Normal heart sounds.  Pulmonary:     Effort: Pulmonary effort is normal.     Breath sounds: Normal breath sounds.  Abdominal:     General: Bowel sounds are normal.  Musculoskeletal:        General: Normal range of motion.  Skin:    General: Skin is warm and dry.  Neurological:     General: No focal deficit present.     Mental Status: She is alert and oriented to person, place, and time. Mental status is at baseline.  Psychiatric:        Mood and Affect: Mood normal.        Behavior: Behavior normal.         Thought Content: Thought content normal.        Judgment: Judgment normal.       11/23/2022   10:23 AM 05/24/2022   11:13 AM 02/14/2022    9:33 AM 11/10/2021   10:10 AM 05/09/2021   11:30 AM  Depression screen PHQ 2/9  Decreased Interest 0 0 0 0 0  Down, Depressed, Hopeless 0 0 0 0 0  PHQ - 2 Score 0 0 0 0 0  Altered sleeping  0     Tired, decreased energy  0     Change in appetite  0     Feeling bad or failure about yourself   0     Trouble concentrating  0     Moving slowly or fidgety/restless  0     Suicidal thoughts  0     PHQ-9 Score  0     Difficult doing work/chores  Not difficult at all          ASSESSMENT/PLAN:  Annual physical exam Assessment & Plan: HCM Colonoscopy 10/23.  Repeat colonoscopy not recommended for surveillance. Diabetic eye exam up-to-date.  No retinopathy.  Due 10/2023 Diabetic foot exam up to date. Due 09/24 Pneumonia series competed Shingles series completed Tetanus up to date Dexa screening completed. 04/22.  Normal Mammogram up to date.  Recently had biopsy.  Pathology negative for atypia and malignancy.  Recommend  yearly Mammogram screening.  Due 04/25 Continue self breast exams regularly. Depression screening completed   Hypothyroidism, unspecified type -     Levothyroxine Sodium; Take 1 tablet (75 mcg total) by mouth daily before breakfast. 30 minutes before food  Dispense: 90 tablet; Refill: 3  Hypertension associated with diabetes (HCC) Assessment & Plan: Chronic.  Stable.  DM type II well-controlled, recent A1c 6.0.  Currently on 5 antihypertensive medications.  Denies any previous workup for secondary causes.  Per JNC 8 guidelines goal BP less than 140/90.   Continue amlodipine 10 mg daily Continue carvedilol 25 mg twice daily Continue hydralazine 10 mg twice daily Continue Hyzaar 100-25 mg daily Follows with cardiology.    Type 2 diabetes with complication [facility]) Assessment & Plan: Chronic.  Well-controlled.  Recent  A1c 6.0.  Tolerating metformin well. Continue metformin 500 mg daily Vitamin B12 level elevated Continue statin therapy Continue ARB therapy Follow-up in 6 months. Eye and Foot exam up to date   Morbid obesity with BMI of 40.0-44.9, adult [facility]) Assessment & Plan: BMI remains elevated.  Discussed healthy lifestyle choices.    Bradycardia Assessment & Plan: Chronic.  Asymptomatic. On BB Follows with Cardiology      PDMP reviewed  Return in about 6 months (around 05/25/2023) for PCP.  Dana Allan, MD

## 2022-11-23 NOTE — Patient Instructions (Addendum)
It was a pleasure meeting you today. Thank you for allowing me to take part in your health care.  Our goals for today as we discussed include:  Refill sent for Levothyroxine  Schedule Medicare Annual Wellness Visit   Foot exam at next visit  Follow up in 6 months for Diabetic check  If you have any questions or concerns, please do not hesitate to call the office at [phone].  I look forward to our next visit and until then take care and stay safe.  Regards,   Dana Allan, MD   [facility]

## 2022-11-30 ENCOUNTER — Encounter: Payer: Self-pay | Admitting: Podiatry

## 2022-11-30 ENCOUNTER — Ambulatory Visit: Payer: Medicare HMO | Admitting: Podiatry

## 2022-11-30 VITALS — BP 136/76 | HR 54

## 2022-11-30 DIAGNOSIS — M79674 Pain in right toe(s): Secondary | ICD-10-CM | POA: Diagnosis not present

## 2022-11-30 DIAGNOSIS — B351 Tinea unguium: Secondary | ICD-10-CM

## 2022-11-30 DIAGNOSIS — E1142 Type 2 diabetes mellitus with diabetic polyneuropathy: Secondary | ICD-10-CM | POA: Diagnosis not present

## 2022-11-30 DIAGNOSIS — M79675 Pain in left toe(s): Secondary | ICD-10-CM | POA: Diagnosis not present

## 2022-11-30 NOTE — Progress Notes (Signed)
  Subjective:  Patient ID: Taylor Shaw, female    DOB: 1941/02/16,  MRN: [id number]  Taylor Shaw presents to clinic today for: at risk foot care with history of diabetic neuropathy and painful thick toenails that are difficult to trim. Pain interferes with ambulation. Aggravating factors include wearing enclosed shoe gear. Pain is relieved with periodic professional debridement.  Chief Complaint  Patient presents with   Diabetes    "Doing these toenails.  The second toe on the left foot and the third toe on the right look like they are ingrowing." Dr. Clent Ridges - 11/23/2022, Alc was good, Glucose - don't check   PCP is Dana Allan, MD.  No Known Allergies  Review of Systems: Negative except as noted in the HPI.  Objective: No changes noted in today's physical examination. Vitals:   11/30/22 1121  BP: 136/76  Pulse: (!) 54    Taylor Shaw is a pleasant 82 y.o. female in NAD. AAO x 3.  Vascular Examination: Capillary refill time <3 seconds b/l LE. Palpable pedal pulses b/l LE. Digital hair absent b/l. No pedal edema b/l. Skin temperature gradient WNL b/l. No varicosities b/l. Marland Kitchen  Dermatological Examination: Pedal skin with normal turgor, texture and tone b/l. No open wounds. No interdigital macerations b/l. Toenails 1-5 b/l thickened, discolored, dystrophic with subungual debris. There is pain on palpation to dorsal aspect of nailplates. No hyperkeratotic nor porokeratotic lesions present on today's visit.Marland Kitchen  Neurological Examination:  Protective sensation diminished with 10g monofilament b/l.  Musculoskeletal Examination: Muscle strength 5/5 to all lower extremity muscle groups bilaterally. No pain, crepitus or joint limitation noted with ROM bilateral LE. No gross bony deformities bilaterally.     Latest Ref Rng & Units 11/16/2022    7:50 AM 02/14/2022   10:20 AM  Hemoglobin A1C  Hemoglobin-A1c 4.6 - 6.5 % 6.0  6.4    Assessment/Plan: 1. Pain due to onychomycosis of  toenails of both feet   2. Diabetic peripheral neuropathy associated with type 2 diabetes mellitus (HCC)     -Patient was evaluated and treated. All patient's and/or POA's questions/concerns answered on today's visit. -Patient to continue soft, supportive shoe gear daily. -Toenails 1-5 b/l were debrided in length and girth with sterile nail nippers and dremel without iatrogenic bleeding.  -Patient/POA to call should there be question/concern in the interim.   Return in about 3 months (around 03/02/2023).  Freddie Breech, DPM

## 2022-12-02 ENCOUNTER — Encounter: Payer: Self-pay | Admitting: Family Medicine

## 2022-12-02 DIAGNOSIS — R001 Bradycardia, unspecified: Secondary | ICD-10-CM | POA: Insufficient documentation

## 2022-12-02 DIAGNOSIS — Z0000 Encounter for general adult medical examination without abnormal findings: Secondary | ICD-10-CM | POA: Insufficient documentation

## 2022-12-02 NOTE — Assessment & Plan Note (Signed)
Chronic.  Well-controlled.  Recent A1c 6.0.  Tolerating metformin well. Continue metformin 500 mg daily Vitamin B12 level elevated Continue statin therapy Continue ARB therapy Follow-up in 6 months. Eye and Foot exam up to date

## 2022-12-02 NOTE — Assessment & Plan Note (Addendum)
Chronic.  Asymptomatic. On BB Follows with Cardiology

## 2022-12-02 NOTE — Assessment & Plan Note (Signed)
HCM Colonoscopy 10/23.  Repeat colonoscopy not recommended for surveillance. Diabetic eye exam up-to-date.  No retinopathy.  Due 10/2023 Diabetic foot exam up to date. Due 09/24 Pneumonia series competed Shingles series completed Tetanus up to date Dexa screening completed. 04/22.  Normal Mammogram up to date.  Recently had biopsy.  Pathology negative for atypia and malignancy.  Recommend  yearly Mammogram screening.  Due 04/25 Continue self breast exams regularly. Depression screening completed

## 2022-12-02 NOTE — Assessment & Plan Note (Signed)
BMI remains elevated.  Discussed healthy lifestyle choices.  

## 2022-12-02 NOTE — Assessment & Plan Note (Signed)
Chronic.  Stable.  DM type II well-controlled, recent A1c 6.0.  Currently on 5 antihypertensive medications.  Denies any previous workup for secondary causes.  Per JNC 8 guidelines goal BP less than 140/90.   Continue amlodipine 10 mg daily Continue carvedilol 25 mg twice daily Continue hydralazine 10 mg twice daily Continue Hyzaar 100-25 mg daily Follows with cardiology.

## 2022-12-13 DIAGNOSIS — G4733 Obstructive sleep apnea (adult) (pediatric): Secondary | ICD-10-CM | POA: Diagnosis not present

## 2022-12-13 DIAGNOSIS — I10 Essential (primary) hypertension: Secondary | ICD-10-CM | POA: Diagnosis not present

## 2022-12-26 DIAGNOSIS — G4733 Obstructive sleep apnea (adult) (pediatric): Secondary | ICD-10-CM | POA: Diagnosis not present

## 2022-12-26 DIAGNOSIS — I10 Essential (primary) hypertension: Secondary | ICD-10-CM | POA: Diagnosis not present

## 2023-01-13 DIAGNOSIS — G4733 Obstructive sleep apnea (adult) (pediatric): Secondary | ICD-10-CM | POA: Diagnosis not present

## 2023-01-13 DIAGNOSIS — I10 Essential (primary) hypertension: Secondary | ICD-10-CM | POA: Diagnosis not present

## 2023-01-26 DIAGNOSIS — G4733 Obstructive sleep apnea (adult) (pediatric): Secondary | ICD-10-CM | POA: Diagnosis not present

## 2023-01-26 DIAGNOSIS — I10 Essential (primary) hypertension: Secondary | ICD-10-CM | POA: Diagnosis not present

## 2023-02-13 DIAGNOSIS — G4733 Obstructive sleep apnea (adult) (pediatric): Secondary | ICD-10-CM | POA: Diagnosis not present

## 2023-02-13 DIAGNOSIS — I10 Essential (primary) hypertension: Secondary | ICD-10-CM | POA: Diagnosis not present

## 2023-02-26 DIAGNOSIS — G4733 Obstructive sleep apnea (adult) (pediatric): Secondary | ICD-10-CM | POA: Diagnosis not present

## 2023-02-26 DIAGNOSIS — I10 Essential (primary) hypertension: Secondary | ICD-10-CM | POA: Diagnosis not present

## 2023-03-09 ENCOUNTER — Encounter: Payer: Self-pay | Admitting: Podiatry

## 2023-03-09 ENCOUNTER — Ambulatory Visit: Payer: Medicare HMO | Admitting: Podiatry

## 2023-03-09 DIAGNOSIS — M79674 Pain in right toe(s): Secondary | ICD-10-CM | POA: Diagnosis not present

## 2023-03-09 DIAGNOSIS — E119 Type 2 diabetes mellitus without complications: Secondary | ICD-10-CM

## 2023-03-09 DIAGNOSIS — M79675 Pain in left toe(s): Secondary | ICD-10-CM | POA: Diagnosis not present

## 2023-03-09 DIAGNOSIS — E1142 Type 2 diabetes mellitus with diabetic polyneuropathy: Secondary | ICD-10-CM

## 2023-03-09 DIAGNOSIS — B351 Tinea unguium: Secondary | ICD-10-CM

## 2023-03-13 NOTE — Progress Notes (Signed)
ANNUAL DIABETIC FOOT EXAM  Subjective: Taylor Shaw presents today annual diabetic foot exam and painful elongated mycotic toenails 1-5 bilaterally which are tender when wearing enclosed shoe gear. Pain is relieved with periodic professional debridement. Patient confirms h/o diabetes.  Patient denies any h/o foot wounds.  Patient has been diagnosed with neuropathy.  Risk factors: diabetes, HTN, hyperlipidemia.  Dana Allan, MD is patient's PCP.  Past Medical History:  Diagnosis Date   Abnormal thyroid function test 10/14/2019   Anal fissure    Arthritis    Arthritis 01/15/2015   Benign neoplasm of ascending colon    Dental abscess 06/02/2022   Diabetes (HCC) 02/19/2015   Diverticulosis 12/07/2016   Sigmoid Colon   Diverticulosis of sigmoid colon 12/08/2016   Diverticulosis of sigmoid colon 12/08/2016   Elevated blood sugar 01/15/2015   GERD (gastroesophageal reflux disease)    Hemorrhoids    History of blood transfusion    Hyperlipidemia    Hypertension 01/15/2015   Hypertension 01/15/2015   Internal hemorrhoids 04/03/2018   Leg edema 02/04/2019   Neck pain 04/03/2018   Obesity 01/15/2015   Obesity, diabetes, and hypertension syndrome (HCC) 01/14/2020   OSA on CPAP    Personal history of colonic polyps    Polyp of descending colon    Umbilical hernia without obstruction and without gangrene 10/26/2020   UTI (urinary tract infection)    Patient Active Problem List   Diagnosis Date Noted   Annual physical exam 12/02/2022   Bradycardia 12/02/2022   Cataract of both eyes 05/27/2020   Hypothyroidism 02/15/2020   Hypertension associated with diabetes (HCC) 01/14/2020   Trigger finger, left middle finger 01/14/2020   Multinodular goiter 10/14/2019   Osteoarthritis 07/04/2018   Diverticulosis 04/03/2018   OSA on CPAP 04/03/2018   GERD (gastroesophageal reflux disease)    Type 2 diabetes with complication (HCC) 02/19/2015   Hyperlipidemia 01/15/2015   Morbid  obesity with BMI of 40.0-44.9, adult (HCC) 01/15/2015   Past Surgical History:  Procedure Laterality Date   BOTOX INJECTION N/A 03/27/2018   Procedure: BOTOX INJECTION;  Surgeon: Leafy Ro, MD;  Location: [facility];  Service: General;  Laterality: N/A;   BREAST BIOPSY Right 09/26/2022   rt br stereo coil clip path pending   BREAST BIOPSY Right 09/26/2022   MM RT BREAST BX W LOC DEV 1ST LESION IMAGE BX SPEC STEREO GUIDE 09/26/2022 [facility]   CATARACT EXTRACTION W/PHACO Left 05/31/2020   Procedure: CATARACT EXTRACTION PHACO AND INTRAOCULAR LENS PLACEMENT (IOC) LEFT;  Surgeon: Nevada Crane, MD;  Location: [facility];  Service: Ophthalmology;  Laterality: Left;  4.16 0:40.4   CATARACT EXTRACTION W/PHACO Right 07/05/2020   Procedure: CATARACT EXTRACTION PHACO AND INTRAOCULAR LENS PLACEMENT (IOC) RIGHT DIABETIC 3.20 00:26.3;  Surgeon: Nevada Crane, MD;  Location: [facility];  Service: Ophthalmology;  Laterality: Right;  Diabetic - oral meds   COLONOSCOPY     COLONOSCOPY WITH PROPOFOL N/A 12/07/2016   Procedure: COLONOSCOPY WITH PROPOFOL;  Surgeon: Midge Minium, MD;  Location: [facility];  Service: Endoscopy;  Laterality: N/A;  Diabetic - oral meds   COLONOSCOPY WITH PROPOFOL N/A 03/21/2022   Procedure: COLONOSCOPY WITH PROPOFOL;  Surgeon: Midge Minium, MD;  Location: [facility];  Service: Endoscopy;  Laterality: N/A;   EVALUATION UNDER ANESTHESIA WITH ANAL FISSUROTOMY N/A 03/27/2018   Procedure: EXAM UNDER ANESTHESIA, CHEMICAL SPHINCTEROTOMY;  Surgeon: Leafy Ro, MD;  Location: [facility];  Service: General;  Laterality: N/A;   HEMORRHOID SURGERY N/A  06/13/2018   Procedure: HEMORRHOIDECTOMY;  Surgeon: Leafy Ro, MD;  Location: [facility];  Service: General;  Laterality: N/A;   JOINT REPLACEMENT     b/l hips in [place] 2004 and 2005 Dr. Farris Has    POLYPECTOMY  12/07/2016   Procedure: POLYPECTOMY;  Surgeon: Midge Minium, MD;  Location:  [facility];  Service: Endoscopy;;   RECTAL EXAM UNDER ANESTHESIA N/A 06/13/2018   Procedure: RECTAL EXAM UNDER ANESTHESIA;  Surgeon: Leafy Ro, MD;  Location: [facility];  Service: General;  Laterality: N/A;   TOTAL HIP ARTHROPLASTY Bilateral    TUBAL LIGATION     Current Outpatient Medications on File Prior to Visit  Medication Sig Dispense Refill   amLODipine (NORVASC) 10 MG tablet Take 1 tablet (10 mg total) by mouth daily. 90 tablet 3   atorvastatin (LIPITOR) 10 MG tablet Take 1 tablet (10 mg total) by mouth daily at 6 PM. 90 tablet 3   Blood Glucose Calibration (TRUE METRIX LEVEL 2) Normal SOLN      blood glucose meter kit and supplies Dispense based on patient and insurance preference. Use up to four times daily as directed. (FOR ICD-10 E10.9, E11.9). 1 each 0   Calcium Carb-Cholecalciferol 600-800 MG-UNIT TABS Take 1 tablet by mouth 2 (two) times daily.     carvedilol (COREG) 25 MG tablet Take 1 tablet (25 mg total) by mouth 2 (two) times daily with a meal. 180 tablet 3   chlorhexidine (PERIDEX) 0.12 % solution PLEASE SEE ATTACHED FOR DETAILED DIRECTIONS     docusate sodium (COLACE) 100 MG capsule Take 100 mg by mouth 2 (two) times daily as needed.     hydrALAZINE (APRESOLINE) 10 MG tablet Take 1 tablet (10 mg total) by mouth in the morning and at bedtime. 180 tablet 3   levothyroxine (SYNTHROID) 75 MCG tablet Take 1 tablet (75 mcg total) by mouth daily before breakfast. 30 minutes before food 90 tablet 3   losartan-hydrochlorothiazide (HYZAAR) 100-25 MG tablet Take 1 tablet by mouth daily. In am 90 tablet 3   metFORMIN (GLUCOPHAGE) 500 MG tablet 1 pill daily in the am with food 90 tablet 3   Omega-3 Fatty Acids (FISH OIL) 1000 MG CAPS Take 1,000 mg by mouth 2 (two) times daily.     omeprazole (PRILOSEC) 20 MG capsule Take 1 capsule (20 mg total) by mouth daily. 30 min before food 90 capsule 3   TRUE METRIX BLOOD GLUCOSE TEST test strip      TRUEplus Lancets 33G MISC       No current facility-administered medications on file prior to visit.    No Known Allergies Social History   Occupational History   Occupation: Warehouse manager covers    Comment: retired  Tobacco Use   Smoking status: Never   Smokeless tobacco: Never   Tobacco comments:    Never   Vaping Use   Vaping status: Never Used  Substance and Sexual Activity   Alcohol use: No    Alcohol/week: 0.0 standard drinks of alcohol   Drug use: No   Sexual activity: Not Currently   Family History  Problem Relation Age of Onset   Dementia Mother    Hypertension Mother    Arthritis Mother    Alcohol abuse Father    Cirrhosis Father    Cancer Daughter        breast   COPD Brother    Breast cancer Sister 42   Cancer Sister        ?  type    Dementia Sister    Cancer Brother        ?type   Cancer Brother        cancer ?type   Cancer Daughter        cancer ?type died 14    Alzheimer's disease Sister    Immunization History  Administered Date(s) Administered   Fluad Quad(high Dose 65+) 02/04/2019, 05/27/2020, 05/03/2021, 02/14/2022   Influenza, High Dose Seasonal PF 02/19/2015, 04/03/2018   Influenza-Unspecified 03/16/2014, 02/27/2017   PFIZER(Purple Top)SARS-COV-2 Vaccination 07/09/2019, 07/30/2019   PNEUMOCOCCAL CONJUGATE-20 05/24/2022   Pfizer Covid-19 Vaccine Bivalent Booster 30yrs & up 01/07/2021   Pfizer(Comirnaty)Fall Seasonal Vaccine 12 years and older 03/17/2022   Pneumococcal Conjugate-13 07/04/2018   Pneumococcal-Unspecified 03/16/2014   Tdap 07/04/2018, 03/28/2019   Zoster Recombinant(Shingrix) 07/04/2018, 03/10/2019, 03/28/2019   Zoster, Live 04/16/2013     Review of Systems: Negative except as noted in the HPI.   Objective: There were no vitals filed for this visit.  Taylor Shaw is a pleasant 82 y.o. female in NAD. AAO X 3.  Vascular Examination: Capillary refill time immediate b/l. Vascular status intact b/l with palpable pedal  pulses. Pedal hair present b/l. No pain with calf compression b/l. Skin temperature gradient WNL b/l. No cyanosis or clubbing b/l. No ischemia or gangrene noted b/l.   Neurological Examination: Protective sensation diminished with 10g monofilament b/l.  Dermatological Examination: Pedal skin with normal turgor, texture and tone b/l.  No open wounds. No interdigital macerations.   Toenails 1-5 b/l thick, discolored, elongated with subungual debris and pain on dorsal palpation.   No corns, calluses nor porokeratotic lesions noted.  Musculoskeletal Examination: Normal muscle strength 5/5 to all lower extremity muscle groups bilaterally. No pain, crepitus or joint limitation noted with ROM b/l LE. No gross bony pedal deformities b/l.   Radiographs: None  Last A1c:      Latest Ref Rng & Units 11/16/2022    7:50 AM  Hemoglobin A1C  Hemoglobin-A1c 4.6 - 6.5 % 6.0    ADA Risk Categorization: High Risk  Patient has one or more of the following: Loss of protective sensation Absent pedal pulses Severe Foot deformity History of foot ulcer  Assessment: 1. Pain due to onychomycosis of toenails of both feet   2. Diabetic peripheral neuropathy associated with type 2 diabetes mellitus (HCC)   3. Encounter for diabetic foot exam (HCC)     Plan: -Consent given for treatment as described below: -Diabetic foot examination performed today. -Continue diabetic foot care principles: inspect feet daily, monitor glucose as recommended by PCP and/or Endocrinologist, and follow prescribed diet per PCP, Endocrinologist and/or dietician. -Continue supportive shoe gear daily. -Toenails 1-5 b/l were debrided in length and girth with sterile nail nippers and dremel without iatrogenic bleeding.  -Patient/POA to call should there be question/concern in the interim. Return in about 3 months (around 06/09/2023).  Freddie Breech, DPM

## 2023-03-29 ENCOUNTER — Ambulatory Visit: Payer: Medicare HMO | Admitting: Adult Health

## 2023-03-29 ENCOUNTER — Encounter: Payer: Self-pay | Admitting: Adult Health

## 2023-03-29 VITALS — BP 126/66 | HR 76 | Temp 98.0°F | Ht 64.0 in | Wt 233.2 lb

## 2023-03-29 DIAGNOSIS — G4733 Obstructive sleep apnea (adult) (pediatric): Secondary | ICD-10-CM

## 2023-03-29 DIAGNOSIS — Z6841 Body Mass Index (BMI) 40.0 and over, adult: Secondary | ICD-10-CM

## 2023-03-29 NOTE — Assessment & Plan Note (Signed)
Healthy weight loss 

## 2023-03-29 NOTE — Assessment & Plan Note (Signed)
Excellent control and compliance on nocturnal CPAP.  No changes in settings  Plan  Patient Instructions  Keep up good work  Work on healthy weight loss Do not drive if sleepy  Follow up in 1 year with Dr. Belia Heman and As needed

## 2023-03-29 NOTE — Patient Instructions (Signed)
Keep up good work  Work on [employer] loss Do not drive if sleepy  Follow up in 1 year with Dr. Mortimer Fries and As needed

## 2023-03-29 NOTE — Progress Notes (Signed)
@Patient  ID: Taylor Shaw, female    DOB: 1940/06/14, 82 y.o.   MRN: [id number]  Chief Complaint  Patient presents with   Follow-up    Referring provider: Dana Allan, MD  HPI: 82 year old female followed for obstructive sleep apnea  TEST/EVENTS :  CPAP download data 01/10/2018-04/09/2018.>>  Raw data personally reviewed uses greater than 4 hours is 86/90 days.  Average usage on days used 7 hours 20 minutes.  Set pressure is 16 CPAP.  Leaks are minimal.  Residual AHI 0.6.  Overall this shows very good compliance with CPAP with excellent control of obstructive sleep apnea. **CPAP download 09/11/2018- 12/09/2018>> raw data personally reviewed.  Usage greater than 4 hours 85/90 days.  Average usage on days used is 8 hours 31 minutes.  Set pressure 16, leaks are within normal limits, residual AHI 0.4.  Overall this shows very good compliance with CPAP with excellent control obstructive sleep apnea.   Review of outside sleep studies and outside records including office visit notes from [facility] [place] received and reviewed, 32 minutes spent, summarized below. **CPAP titration study 01/25/2011 CPAP titrated to 16. **Baseline PSG 12/14/2010: Severe obstructive sleep apnea with AHI of 39.  CPAP titration was recommended.   New CPAP machine 01/2022- uses full face mask.   03/29/2023 Follow up : OSA  Patient presents for a 1 year follow-up.  Patient has underlying severe sleep apnea.  She remains on CPAP at bedtime.  She wears her CPAP every single night.  Cannot sleep without it.  She feels she benefits from CPAP with decreased daytime sleepiness.  CPAP download shows excellent compliance with 100% usage.  Daily average usage at 9 hours.  Patient is on CPAP 16 cm H2O.  AHI 0.5/hour.   No Known Allergies  Immunization History  Administered Date(s) Administered   Fluad Quad(high Dose 65+) 02/04/2019, 05/27/2020, 05/03/2021, 02/14/2022   Influenza, High Dose Seasonal PF  02/19/2015, 04/03/2018   Influenza-Unspecified 03/16/2014, 02/27/2017   PFIZER(Purple Top)SARS-COV-2 Vaccination 07/09/2019, 07/30/2019   PNEUMOCOCCAL CONJUGATE-20 05/24/2022   Pfizer Covid-19 Vaccine Bivalent Booster 27yrs & up 01/07/2021   Pfizer(Comirnaty)Fall Seasonal Vaccine 12 years and older 03/17/2022   Pneumococcal Conjugate-13 07/04/2018   Pneumococcal-Unspecified 03/16/2014   Tdap 07/04/2018, 03/28/2019   Zoster Recombinant(Shingrix) 07/04/2018, 03/10/2019, 03/28/2019   Zoster, Live 04/16/2013    Past Medical History:  Diagnosis Date   Abnormal thyroid function test 10/14/2019   Anal fissure    Arthritis    Arthritis 01/15/2015   Benign neoplasm of ascending colon    Dental abscess 06/02/2022   Diabetes (HCC) 02/19/2015   Diverticulosis 12/07/2016   Sigmoid Colon   Diverticulosis of sigmoid colon 12/08/2016   Diverticulosis of sigmoid colon 12/08/2016   Elevated blood sugar 01/15/2015   GERD (gastroesophageal reflux disease)    Hemorrhoids    History of blood transfusion    Hyperlipidemia    Hypertension 01/15/2015   Hypertension 01/15/2015   Internal hemorrhoids 04/03/2018   Leg edema 02/04/2019   Neck pain 04/03/2018   Obesity 01/15/2015   Obesity, diabetes, and hypertension syndrome (HCC) 01/14/2020   OSA on CPAP    Personal history of colonic polyps    Polyp of descending colon    Umbilical hernia without obstruction and without gangrene 10/26/2020   UTI (urinary tract infection)     Tobacco History: Social History   Tobacco Use  Smoking Status Never  Smokeless Tobacco Never  Tobacco Comments   Never    Counseling  given: Not Answered Tobacco comments: Never    Outpatient Medications Prior to Visit  Medication Sig Dispense Refill   amLODipine (NORVASC) 10 MG tablet Take 1 tablet (10 mg total) by mouth daily. 90 tablet 3   atorvastatin (LIPITOR) 10 MG tablet Take 1 tablet (10 mg total) by mouth daily at 6 PM. 90 tablet 3   Blood Glucose  Calibration (TRUE METRIX LEVEL 2) Normal SOLN      blood glucose meter kit and supplies Dispense based on patient and insurance preference. Use up to four times daily as directed. (FOR ICD-10 E10.9, E11.9). 1 each 0   Calcium Carb-Cholecalciferol 600-800 MG-UNIT TABS Take 1 tablet by mouth 2 (two) times daily.     carvedilol (COREG) 25 MG tablet Take 1 tablet (25 mg total) by mouth 2 (two) times daily with a meal. 180 tablet 3   chlorhexidine (PERIDEX) 0.12 % solution PLEASE SEE ATTACHED FOR DETAILED DIRECTIONS     docusate sodium (COLACE) 100 MG capsule Take 100 mg by mouth 2 (two) times daily as needed.     hydrALAZINE (APRESOLINE) 10 MG tablet Take 1 tablet (10 mg total) by mouth in the morning and at bedtime. 180 tablet 3   levothyroxine (SYNTHROID) 75 MCG tablet Take 1 tablet (75 mcg total) by mouth daily before breakfast. 30 minutes before food 90 tablet 3   losartan-hydrochlorothiazide (HYZAAR) 100-25 MG tablet Take 1 tablet by mouth daily. In am 90 tablet 3   metFORMIN (GLUCOPHAGE) 500 MG tablet 1 pill daily in the am with food 90 tablet 3   Omega-3 Fatty Acids (FISH OIL) 1000 MG CAPS Take 1,000 mg by mouth 2 (two) times daily.     omeprazole (PRILOSEC) 20 MG capsule Take 1 capsule (20 mg total) by mouth daily. 30 min before food 90 capsule 3   TRUE METRIX BLOOD GLUCOSE TEST test strip      TRUEplus Lancets 33G MISC      No facility-administered medications prior to visit.     Review of Systems:   Constitutional:   No  weight loss, night sweats,  Fevers, chills, fatigue, or  lassitude.  HEENT:   No headaches,  Difficulty swallowing,  Tooth/dental problems, or  Sore throat,                No sneezing, itching, ear ache, nasal congestion, post nasal drip,   CV:  No chest pain,  Orthopnea, PND, swelling in lower extremities, anasarca, dizziness, palpitations, syncope.   GI  No heartburn, indigestion, abdominal pain, nausea, vomiting, diarrhea, change in bowel habits, loss of  appetite, bloody stools.   Resp: No shortness of breath with exertion or at rest.  No excess mucus, no productive cough,  No non-productive cough,  No coughing up of blood.  No change in color of mucus.  No wheezing.  No chest wall deformity  Skin: no rash or lesions.  GU: no dysuria, change in color of urine, no urgency or frequency.  No flank pain, no hematuria   MS:  No joint pain or swelling.  No decreased range of motion.  No back pain.    Physical Exam  BP 126/66 (BP Location: Left Arm, Patient Position: Sitting, Cuff Size: Normal)   Pulse 76   Temp 98 F (36.7 C) (Temporal)   Ht 5\' 4"  (1.626 m)   Wt 233 lb 3.2 oz (105.8 kg)   SpO2 97%   BMI 40.03 kg/m   GEN: A/Ox3; pleasant , NAD, well nourished  HEENT:  [place]/AT,   NOSE-clear, THROAT-clear, no lesions, no postnasal drip or exudate noted. Class 3 MP airway   NECK:  Supple w/ fair ROM; no JVD; normal carotid impulses w/o bruits; no thyromegaly or nodules palpated; no lymphadenopathy.    RESP  Clear  P & A; w/o, wheezes/ rales/ or rhonchi. no accessory muscle use, no dullness to percussion  CARD:  RRR, no m/r/g, no peripheral edema, pulses intact, no cyanosis or clubbing.  GI:   Soft & nt; nml bowel sounds; no organomegaly or masses detected.   Musco: Warm bil, no deformities or joint swelling noted.   Neuro: alert, no focal deficits noted.    Skin: Warm, no lesions or rashes    Lab Results:   BNP No results found for: "BNP"  ProBNP No results found for: "PROBNP"  Imaging: No results found.  Administration History     None           No data to display          No results found for: "NITRICOXIDE"      Assessment & Plan:   OSA on CPAP Excellent control and compliance on nocturnal CPAP.  No changes in settings  Plan  Patient Instructions  Keep up good work  Work on healthy weight loss Do not drive if sleepy  Follow up in 1 year with Dr. Belia Heman and As needed      Morbid obesity  with BMI of 40.0-44.9, adult (HCC) Healthy weight loss      Rubye Oaks, NP 03/29/2023

## 2023-04-18 NOTE — Telephone Encounter (Signed)
Error

## 2023-04-30 ENCOUNTER — Ambulatory Visit (INDEPENDENT_AMBULATORY_CARE_PROVIDER_SITE_OTHER): Payer: Medicare HMO | Admitting: Family

## 2023-04-30 ENCOUNTER — Telehealth: Payer: Self-pay

## 2023-04-30 ENCOUNTER — Encounter: Payer: Self-pay | Admitting: Family

## 2023-04-30 VITALS — BP 136/70 | HR 68 | Temp 97.5°F | Ht 64.0 in | Wt 229.8 lb

## 2023-04-30 DIAGNOSIS — R1032 Left lower quadrant pain: Secondary | ICD-10-CM | POA: Insufficient documentation

## 2023-04-30 LAB — URINALYSIS, ROUTINE W REFLEX MICROSCOPIC
Bilirubin Urine: NEGATIVE
Hgb urine dipstick: NEGATIVE
Ketones, ur: NEGATIVE
Leukocytes,Ua: NEGATIVE
Nitrite: NEGATIVE
RBC / HPF: NONE SEEN (ref 0–?)
Specific Gravity, Urine: <=1.005 — AB (ref 1.000–1.030)
Total Protein, Urine: NEGATIVE
Urine Glucose: NEGATIVE
Urobilinogen, UA: 0.2 (ref 0.0–1.0)
pH: 6 (ref 5.0–8.0)

## 2023-04-30 NOTE — Assessment & Plan Note (Signed)
History of bilateral hip replacement.  She is walking with a limp and I suspect this is aggravating symptoms as well.  Question if underlying arthritic changes present.  Pending x-ray left hip, lumbar spine.  Advised to start Tylenol arthritis 2 tablets in the morning and 2 in the evening.  Encouraged use of lidocaine patch.  Referral to physical therapy

## 2023-04-30 NOTE — Patient Instructions (Addendum)
We will schedule your x-rays   as discussed, let's start by scheduling Tylenol Arthritis which is a 650mg  tablet .   You may take 2 tablets every ( scheduled) morning and every afternoon for the next couple of weeks; thenmay reduce dose and take as needed.  Please purchase over-the-counter Salonpas pain patch.  May also may purchase a generic version.  The active ingredient is lidocaine.   Please ensure you are either using heat or ice for 20 minutes 2 or 3 times per day  I have placed a referral to physical therapy  Let us know if you dont hear back within a week in regards to an appointment being scheduled.    Nice to meet you!

## 2023-04-30 NOTE — Progress Notes (Signed)
Assessment & Plan:  Left groin pain Assessment & Plan: History of bilateral hip replacement.  She is walking with a limp and I suspect this is aggravating symptoms as well.  Question if underlying arthritic changes present.  Pending x-ray left hip, lumbar spine.  Advised to start Tylenol arthritis 2 tablets in the morning and 2 in the evening.  Encouraged use of lidocaine patch.  Referral to physical therapy  Orders: -     DG Lumbar Spine Complete; Future -     DG HIP UNILAT W OR W/O PELVIS 2-3 VIEWS LEFT; Future -     Ambulatory referral to Physical Therapy -     Urinalysis, Routine w reflex microscopic     Return precautions given.   Risks, benefits, and alternatives of the medications and treatment plan prescribed today were discussed, and patient expressed understanding.   Education regarding symptom management and diagnosis given to patient on AVS either electronically or printed.  Return if symptoms worsen or fail to improve.  Rennie Plowman, FNP  Subjective:    Patient ID: Taylor Shaw, female    DOB: 06-17-1940, 82 y.o.   MRN: [id number]  CC: Taylor Shaw is a 82 y.o. female who presents today for an acute visit.    HPI: Complains of left groin pain x 1 week, gradual onset.   Describes a 'stab ' and 'sore'  Today is better. Pain is worse with walking or sitting on left hip. She is able to sleep on left side.    She walks with a cane due to imbalance.   No numbness in legs or in the groin.   Denies dysuria, F, vomiting, leg swelling, urinary frequency  Last BM last night.   She is passing gas.   She takes magnesium for constipation as needed.   She notes that she has left hip pain every since hip replacement.   History of diabetes, OSA, GERD, hypertension. No h/o CKD  Right hip x-ray 03/2015 with multilevel facet arthropathy and mild degenerative disc disease of lumbar spine  Status post bilateral total hip arthroplasties   Lab Results   Component Value Date   HGBA1C 6.0 11/16/2022    Allergies: Patient has no known allergies. Current Outpatient Medications on File Prior to Visit  Medication Sig Dispense Refill   amLODipine (NORVASC) 10 MG tablet Take 1 tablet (10 mg total) by mouth daily. 90 tablet 3   atorvastatin (LIPITOR) 10 MG tablet Take 1 tablet (10 mg total) by mouth daily at 6 PM. 90 tablet 3   Blood Glucose Calibration (TRUE METRIX LEVEL 2) Normal SOLN      blood glucose meter kit and supplies Dispense based on patient and insurance preference. Use up to four times daily as directed. (FOR ICD-10 E10.9, E11.9). 1 each 0   Calcium Carb-Cholecalciferol 600-800 MG-UNIT TABS Take 1 tablet by mouth 2 (two) times daily.     carvedilol (COREG) 25 MG tablet Take 1 tablet (25 mg total) by mouth 2 (two) times daily with a meal. 180 tablet 3   chlorhexidine (PERIDEX) 0.12 % solution PLEASE SEE ATTACHED FOR DETAILED DIRECTIONS     docusate sodium (COLACE) 100 MG capsule Take 100 mg by mouth 2 (two) times daily as needed.     hydrALAZINE (APRESOLINE) 10 MG tablet Take 1 tablet (10 mg total) by mouth in the morning and at bedtime. 180 tablet 3   levothyroxine (SYNTHROID) 75 MCG tablet Take 1 tablet (75 mcg total) by mouth  daily before breakfast. 30 minutes before food 90 tablet 3   losartan-hydrochlorothiazide (HYZAAR) 100-25 MG tablet Take 1 tablet by mouth daily. In am 90 tablet 3   metFORMIN (GLUCOPHAGE) 500 MG tablet 1 pill daily in the am with food 90 tablet 3   Omega-3 Fatty Acids (FISH OIL) 1000 MG CAPS Take 1,000 mg by mouth 2 (two) times daily.     omeprazole (PRILOSEC) 20 MG capsule Take 1 capsule (20 mg total) by mouth daily. 30 min before food 90 capsule 3   TRUE METRIX BLOOD GLUCOSE TEST test strip      TRUEplus Lancets 33G MISC      No current facility-administered medications on file prior to visit.    Review of Systems  Constitutional:  Negative for chills and fever.  Respiratory:  Negative for cough.    Cardiovascular:  Negative for chest pain and palpitations.  Gastrointestinal:  Positive for constipation (occassional). Negative for abdominal pain, nausea and vomiting.  Genitourinary:  Negative for dysuria and frequency.  Musculoskeletal:  Positive for arthralgias, back pain and gait problem.      Objective:    BP 136/70   Pulse 68   Temp (!) 97.5 F (36.4 C) (Oral)   Ht 5\' 4"  (1.626 m)   Wt 229 lb 12.8 oz (104.2 kg)   SpO2 98%   BMI 39.45 kg/m   BP Readings from Last 3 Encounters:  04/30/23 136/70  03/29/23 126/66  11/30/22 136/76   Wt Readings from Last 3 Encounters:  04/30/23 229 lb 12.8 oz (104.2 kg)  03/29/23 233 lb 3.2 oz (105.8 kg)  11/23/22 231 lb (104.8 kg)    Physical Exam Vitals reviewed.  Constitutional:      Appearance: She is well-developed.  Eyes:     Conjunctiva/sclera: Conjunctivae normal.  Cardiovascular:     Rate and Rhythm: Normal rate and regular rhythm.     Pulses: Normal pulses.     Heart sounds: Normal heart sounds.  Pulmonary:     Effort: Pulmonary effort is normal.     Breath sounds: Normal breath sounds. No wheezing, rhonchi or rales.  Musculoskeletal:     Lumbar back: No swelling, edema, spasms, tenderness or bony tenderness. Normal range of motion. Negative right straight leg raise test and negative left straight leg raise test.       Back:     Comments: Full range of motion with flexion, tension, lateral side bends. No bony tenderness. Area of pain marked on diagram, unable to reproduce pain.  No pain, numbness, tingling elicited with single leg raise bilaterally.   Left Hip: Limp present. Full ROM with flexion and hip rotation in flexion.    No pain of lateral hip with  (flexion-abduction-external rotation) test.   No pain with deep palpation of greater trochanter.      Skin:    General: Skin is warm and dry.  Neurological:     Mental Status: She is alert.     Sensory: No sensory deficit.     Deep Tendon Reflexes:      Reflex Scores:      Patellar reflexes are 2+ on the right side and 2+ on the left side.    Comments: Sensation and strength intact bilateral lower extremities.  Psychiatric:        Speech: Speech normal.        Behavior: Behavior normal.        Thought Content: Thought content normal.

## 2023-04-30 NOTE — Telephone Encounter (Signed)
Patient states she would like to have some clarification regarding where she needs to go for her x-ray.

## 2023-05-01 ENCOUNTER — Ambulatory Visit: Payer: Medicare HMO

## 2023-05-01 ENCOUNTER — Other Ambulatory Visit: Payer: Medicare HMO

## 2023-05-01 DIAGNOSIS — M25552 Pain in left hip: Secondary | ICD-10-CM | POA: Diagnosis not present

## 2023-05-01 DIAGNOSIS — M47816 Spondylosis without myelopathy or radiculopathy, lumbar region: Secondary | ICD-10-CM | POA: Diagnosis not present

## 2023-05-01 DIAGNOSIS — R1032 Left lower quadrant pain: Secondary | ICD-10-CM

## 2023-05-01 DIAGNOSIS — M4316 Spondylolisthesis, lumbar region: Secondary | ICD-10-CM | POA: Diagnosis not present

## 2023-05-01 DIAGNOSIS — Z96643 Presence of artificial hip joint, bilateral: Secondary | ICD-10-CM | POA: Diagnosis not present

## 2023-05-01 NOTE — Telephone Encounter (Signed)
X-rays done today.

## 2023-05-02 ENCOUNTER — Other Ambulatory Visit: Payer: Medicare HMO

## 2023-05-08 ENCOUNTER — Telehealth: Payer: Self-pay | Admitting: Family Medicine

## 2023-05-08 NOTE — Telephone Encounter (Signed)
Pt would like to be called regarding her labs. Pt stated to call her on her home phone

## 2023-05-09 NOTE — Telephone Encounter (Signed)
noted 

## 2023-05-26 DIAGNOSIS — G4733 Obstructive sleep apnea (adult) (pediatric): Secondary | ICD-10-CM | POA: Diagnosis not present

## 2023-05-26 DIAGNOSIS — I10 Essential (primary) hypertension: Secondary | ICD-10-CM | POA: Diagnosis not present

## 2023-05-28 ENCOUNTER — Encounter: Payer: Self-pay | Admitting: Family Medicine

## 2023-05-28 ENCOUNTER — Ambulatory Visit: Payer: Medicare HMO | Admitting: Family Medicine

## 2023-05-28 VITALS — BP 122/78 | HR 71 | Temp 98.0°F | Resp 18 | Ht 64.0 in | Wt 231.5 lb

## 2023-05-28 DIAGNOSIS — E1169 Type 2 diabetes mellitus with other specified complication: Secondary | ICD-10-CM | POA: Diagnosis not present

## 2023-05-28 DIAGNOSIS — Z7984 Long term (current) use of oral hypoglycemic drugs: Secondary | ICD-10-CM

## 2023-05-28 DIAGNOSIS — E785 Hyperlipidemia, unspecified: Secondary | ICD-10-CM | POA: Diagnosis not present

## 2023-05-28 DIAGNOSIS — I152 Hypertension secondary to endocrine disorders: Secondary | ICD-10-CM

## 2023-05-28 DIAGNOSIS — Z23 Encounter for immunization: Secondary | ICD-10-CM | POA: Diagnosis not present

## 2023-05-28 DIAGNOSIS — E1159 Type 2 diabetes mellitus with other circulatory complications: Secondary | ICD-10-CM | POA: Diagnosis not present

## 2023-05-28 DIAGNOSIS — E118 Type 2 diabetes mellitus with unspecified complications: Secondary | ICD-10-CM

## 2023-05-28 DIAGNOSIS — M5430 Sciatica, unspecified side: Secondary | ICD-10-CM

## 2023-05-28 DIAGNOSIS — K219 Gastro-esophageal reflux disease without esophagitis: Secondary | ICD-10-CM | POA: Diagnosis not present

## 2023-05-28 DIAGNOSIS — R202 Paresthesia of skin: Secondary | ICD-10-CM

## 2023-05-28 LAB — CBC WITH DIFFERENTIAL/PLATELET
Basophils Absolute: 0.1 10*3/uL (ref 0.0–0.1)
Basophils Relative: 1.2 % (ref 0.0–3.0)
Eosinophils Absolute: 0.1 10*3/uL (ref 0.0–0.7)
Eosinophils Relative: 2 % (ref 0.0–5.0)
HCT: 36.1 % (ref 36.0–46.0)
Hemoglobin: 11.7 g/dL — ABNORMAL LOW (ref 12.0–15.0)
Lymphocytes Relative: 36.1 % (ref 12.0–46.0)
Lymphs Abs: 2.5 10*3/uL (ref 0.7–4.0)
MCHC: 32.5 g/dL (ref 30.0–36.0)
MCV: 83.2 fL (ref 78.0–100.0)
Monocytes Absolute: 0.5 10*3/uL (ref 0.1–1.0)
Monocytes Relative: 7.3 % (ref 3.0–12.0)
Neutro Abs: 3.7 10*3/uL (ref 1.4–7.7)
Neutrophils Relative %: 53.4 % (ref 43.0–77.0)
Platelets: 219 10*3/uL (ref 150.0–400.0)
RBC: 4.33 Mil/uL (ref 3.87–5.11)
RDW: 14.6 % (ref 11.5–15.5)
WBC: 7 10*3/uL (ref 4.0–10.5)

## 2023-05-28 LAB — COMPREHENSIVE METABOLIC PANEL
ALT: 7 U/L (ref 0–35)
AST: 13 U/L (ref 0–37)
Albumin: 3.9 g/dL (ref 3.5–5.2)
Alkaline Phosphatase: 62 U/L (ref 39–117)
BUN: 14 mg/dL (ref 6–23)
CO2: 32 meq/L (ref 19–32)
Calcium: 9.4 mg/dL (ref 8.4–10.5)
Chloride: 101 meq/L (ref 96–112)
Creatinine, Ser: 0.83 mg/dL (ref 0.40–1.20)
GFR: 66.21 mL/min (ref >60.00)
Glucose, Bld: 97 mg/dL (ref 70–99)
Potassium: 3.6 meq/L (ref 3.5–5.1)
Sodium: 141 meq/L (ref 135–145)
Total Bilirubin: 1 mg/dL (ref 0.2–1.2)
Total Protein: 7 g/dL (ref 6.0–8.3)

## 2023-05-28 LAB — POCT GLYCOSYLATED HEMOGLOBIN (HGB A1C): Hemoglobin A1C: 6.1 % — AB (ref 4.0–5.6)

## 2023-05-28 LAB — MICROALBUMIN / CREATININE URINE RATIO
Creatinine,U: 22.8 mg/dL
Microalb Creat Ratio: 4.2 mg/g (ref 0.0–30.0)
Microalb, Ur: 1 mg/dL (ref 0.0–1.9)

## 2023-05-28 LAB — VITAMIN B12: Vitamin B-12: 696 pg/mL (ref 211–911)

## 2023-05-28 MED ORDER — CARVEDILOL 25 MG PO TABS: 25.0000 mg | ORAL_TABLET | Freq: Two times a day (BID) | ORAL | 3 refills | Status: DC

## 2023-05-28 MED ORDER — ATORVASTATIN CALCIUM 10 MG PO TABS: 10.0000 mg | ORAL_TABLET | Freq: Every day | ORAL | 3 refills | Status: DC

## 2023-05-28 MED ORDER — LOSARTAN POTASSIUM-HCTZ 100-25 MG PO TABS: 1.0000 | ORAL_TABLET | Freq: Every day | ORAL | 3 refills | Status: DC

## 2023-05-28 MED ORDER — HYDRALAZINE HCL 10 MG PO TABS: 10.0000 mg | ORAL_TABLET | Freq: Two times a day (BID) | ORAL | 3 refills | Status: DC

## 2023-05-28 MED ORDER — METFORMIN HCL 500 MG PO TABS
ORAL_TABLET | ORAL | 3 refills | Status: DC
Start: 2023-05-28 — End: 2023-11-12

## 2023-05-28 MED ORDER — AMLODIPINE BESYLATE 10 MG PO TABS: 10.0000 mg | ORAL_TABLET | Freq: Every day | ORAL | 3 refills | Status: DC

## 2023-05-28 MED ORDER — OMEPRAZOLE 20 MG PO CPDR: 20.0000 mg | DELAYED_RELEASE_CAPSULE | Freq: Every day | ORAL | 3 refills | Status: DC

## 2023-05-28 NOTE — Patient Instructions (Signed)
It was a pleasure meeting you today. Thank you for allowing me to take part in your health care.  Our goals for today as we discussed include:  We will get some labs today.  If they are abnormal or we need to do something about them, I will call you.  If they are normal, I will send you a message on MyChart (if it is active) or a letter in the mail.  If you don't hear from [place] in 2 weeks, please call the office at the number below.   Flu vaccine administered today  Refills sent for requested medications   This is a list of the screening recommended for you and due dates:  Health Maintenance  Topic Date Due   Medicare Annual Wellness Visit  05/09/2022   COVID-19 Vaccine (5 - 2024-25 season) 02/04/2023   Yearly kidney health urinalysis for diabetes  05/25/2023   Flu Shot  09/03/2023*   Mammogram  09/12/2023   Eye exam for diabetics  10/27/2023   Yearly kidney function blood test for diabetes  11/16/2023   Hemoglobin A1C  11/26/2023   Complete foot exam   03/08/2024   DTaP/Tdap/Td vaccine (3 - Td or Tdap) 03/27/2029   Pneumonia Vaccine  Completed   DEXA scan (bone density measurement)  Completed   Zoster (Shingles) Vaccine  Completed   HPV Vaccine  Aged Out  *Topic was postponed. The date shown is not the original due date.     If you have any questions or concerns, please do not hesitate to call the office at [phone].  I look forward to our next visit and until then take care and stay safe.  Regards,   Dana Allan, MD   [facility]

## 2023-05-28 NOTE — Progress Notes (Unsigned)
SUBJECTIVE:   Chief Complaint  Patient presents with   Medical Management of Chronic Issues    6 month follow    HPI Presents to clinic for follow up chronic disease management  Discussed the use of AI scribe software for clinical note transcription with the patient, who gave verbal consent to proceed.  History of Present Illness The patient, with a history of hip replacement, presented for a six-month follow-up. She reported a recent episode of sciatica, which was managed with physical therapy and has since improved. The patient noted that cold weather seemed to exacerbate the symptoms. She also reported numbness and stiffness in the left hand, particularly at night, which has been an intermittent issue for several years. The numbness sometimes extends the length of the arm. The patient denied any associated pain, weakness, or similar symptoms in the right hand.  The patient also mentioned a significant amount of [place]ilting project, which she believed may have contributed to her sciatica flare. She has since modified her walking route to minimize discomfort.  The patient's blood pressure has been well-controlled, and she denied any chest pain, shortness of breath, vision problems, or leg swelling. She has been managing her diabetes well, with a recent HbA1c of 6.1.     PERTINENT PMH / PSH: As above  OBJECTIVE:  BP 122/78   Pulse 71   Temp 98 F (36.7 C)   Resp 18   Ht 5\' 4"  (1.626 m)   Wt 231 lb 8 oz (105 kg)   SpO2 95%   BMI 39.74 kg/m    Physical Exam Vitals reviewed.  Constitutional:      General: She is not in acute distress.    Appearance: Normal appearance. She is normal weight. She is not ill-appearing, toxic-appearing or diaphoretic.  Eyes:     General:        Right eye: No discharge.        Left eye: No discharge.     Conjunctiva/sclera: Conjunctivae normal.  Cardiovascular:     Rate and Rhythm: Normal rate and regular rhythm.      Heart sounds: Normal heart sounds.  Pulmonary:     Effort: Pulmonary effort is normal.     Breath sounds: Normal breath sounds.  Abdominal:     General: Bowel sounds are normal.  Musculoskeletal:        General: Normal range of motion.  Skin:    General: Skin is warm and dry.  Neurological:     General: No focal deficit present.     Mental Status: She is alert and oriented to person, place, and time. Mental status is at baseline.  Psychiatric:        Mood and Affect: Mood normal.        Behavior: Behavior normal.        Thought Content: Thought content normal.        Judgment: Judgment normal.        05/28/2023   11:12 AM 04/30/2023   10:28 AM 11/23/2022   10:23 AM 05/24/2022   11:13 AM 02/14/2022    9:33 AM  Depression screen PHQ 2/9  Decreased Interest 0 0 0 0 0  Down, Depressed, Hopeless 0 0 0 0 0  PHQ - 2 Score 0 0 0 0 0  Altered sleeping 0   0   Tired, decreased energy 0   0   Change in appetite 0   0  Feeling bad or failure about yourself  0   0   Trouble concentrating 0   0   Moving slowly or fidgety/restless 0   0   Suicidal thoughts 0   0   PHQ-9 Score 0   0   Difficult doing work/chores Not difficult at all   Not difficult at all       05/28/2023   11:12 AM 05/24/2022   11:14 AM 01/14/2020    9:14 AM 10/14/2019   10:31 AM  GAD 7 : Generalized Anxiety Score  Nervous, Anxious, on Edge 0 0 0 0  Control/stop worrying 0 0 0 0  Worry too much - different things 0 0 0 0  Trouble relaxing 0 0 0 0  Restless 0 0 0 0  Easily annoyed or irritable 0 0 0 0  Afraid - awful might happen 0 0 0 0  Total GAD 7 Score 0 0 0 0  Anxiety Difficulty Not difficult at all Not difficult at all Not difficult at all Not difficult at all    ASSESSMENT/PLAN:  Type 2 diabetes with complication [facility]) Assessment & Plan: Chronic.  Well-controlled. Refill metformin 500 mg daily Check A1c Vitamin B12 level elevated Continue statin therapy Continue ARB therapy Follow-up in 6  months. Eye and Foot exam up to date  Orders: -     metFORMIN HCl; 1 pill daily in the am with food  Dispense: 90 tablet; Refill: 3 -     Microalbumin / creatinine urine ratio -     POCT glycosylated hemoglobin (Hb A1C) -     Vitamin B12 -     CBC with Differential/Platelet  Hyperlipidemia associated with type 2 diabetes mellitus (HCC) Assessment & Plan: Chronic.  Stable.  On statin therapy and tolerating well.  No myalgias. Refill Lipitor 10 mg daily.  Orders: -     Atorvastatin Calcium; Take 1 tablet (10 mg total) by mouth daily at 6 PM.  Dispense: 90 tablet; Refill: 3  Gastroesophageal reflux disease without esophagitis Assessment & Plan: Managed with PPI -Refill Omeprazole  Orders: -     Omeprazole; Take 1 capsule (20 mg total) by mouth daily. 30 min before food  Dispense: 90 capsule; Refill: 3  Hypertension associated with diabetes (HCC) Assessment & Plan: Well controlled, no symptoms of end-organ damage. -Continue current antihypertensive regimen.  Orders: -     amLODIPine Besylate; Take 1 tablet (10 mg total) by mouth daily.  Dispense: 90 tablet; Refill: 3 -     Carvedilol; Take 1 tablet (25 mg total) by mouth 2 (two) times daily with a meal.  Dispense: 180 tablet; Refill: 3 -     hydrALAZINE HCl; Take 1 tablet (10 mg total) by mouth in the morning and at bedtime.  Dispense: 180 tablet; Refill: 3 -     Losartan Potassium-HCTZ; Take 1 tablet by mouth daily. In am  Dispense: 90 tablet; Refill: 3 -     Comprehensive metabolic panel  Need for influenza vaccination -     Flu Vaccine Trivalent High Dose (Fluad)  Paresthesia of hand Assessment & Plan: Intermittent numbness in the left hand, predominantly in the three fingers. No associated pain, weakness, or tingling. No impact on daily activities. -Trial of wrist brace at night. -If symptoms worsen, consider referral to neurology.   Sciatica, unspecified laterality Assessment & Plan: Recent episode of sciatica  improved with physical therapy and rest. No current pain or functional limitations. -Continue current management strategies including physical therapy  exercises at home.     PDMP reviewed  Return in about 6 months (around 11/26/2023) for PCP, HTN, DM, HLD.  Dana Allan, MD

## 2023-05-31 ENCOUNTER — Encounter: Payer: Self-pay | Admitting: Family Medicine

## 2023-05-31 DIAGNOSIS — M5430 Sciatica, unspecified side: Secondary | ICD-10-CM | POA: Insufficient documentation

## 2023-05-31 DIAGNOSIS — R202 Paresthesia of skin: Secondary | ICD-10-CM | POA: Insufficient documentation

## 2023-05-31 NOTE — Assessment & Plan Note (Signed)
Well controlled, no symptoms of end-organ damage. -Continue current antihypertensive regimen.

## 2023-05-31 NOTE — Assessment & Plan Note (Signed)
Recent episode of sciatica improved with physical therapy and rest. No current pain or functional limitations. -Continue current management strategies including physical therapy exercises at home.

## 2023-05-31 NOTE — Assessment & Plan Note (Signed)
Intermittent numbness in the left hand, predominantly in the three fingers. No associated pain, weakness, or tingling. No impact on daily activities. -Trial of wrist brace at night. -If symptoms worsen, consider referral to neurology.

## 2023-05-31 NOTE — Assessment & Plan Note (Signed)
Managed with PPI -Refill Omeprazole

## 2023-05-31 NOTE — Assessment & Plan Note (Signed)
Chronic.  Stable.  On statin therapy and tolerating well.  No myalgias. Refill Lipitor 10 mg daily.

## 2023-05-31 NOTE — Assessment & Plan Note (Signed)
Chronic.  Well-controlled. Refill metformin 500 mg daily Check A1c Vitamin B12 level elevated Continue statin therapy Continue ARB therapy Follow-up in 6 months. Eye and Foot exam up to date

## 2023-06-11 ENCOUNTER — Ambulatory Visit: Payer: Medicare HMO | Admitting: Podiatry

## 2023-06-11 ENCOUNTER — Encounter: Payer: Self-pay | Admitting: Podiatry

## 2023-06-11 ENCOUNTER — Ambulatory Visit (INDEPENDENT_AMBULATORY_CARE_PROVIDER_SITE_OTHER): Payer: Medicare HMO | Admitting: Podiatry

## 2023-06-11 DIAGNOSIS — M79674 Pain in right toe(s): Secondary | ICD-10-CM

## 2023-06-11 DIAGNOSIS — B351 Tinea unguium: Secondary | ICD-10-CM | POA: Diagnosis not present

## 2023-06-11 DIAGNOSIS — E1142 Type 2 diabetes mellitus with diabetic polyneuropathy: Secondary | ICD-10-CM

## 2023-06-11 DIAGNOSIS — L84 Corns and callosities: Secondary | ICD-10-CM

## 2023-06-11 DIAGNOSIS — Z91198 Patient's noncompliance with other medical treatment and regimen for other reason: Secondary | ICD-10-CM

## 2023-06-11 DIAGNOSIS — M79675 Pain in left toe(s): Secondary | ICD-10-CM | POA: Diagnosis not present

## 2023-06-14 DIAGNOSIS — Z008 Encounter for other general examination: Secondary | ICD-10-CM | POA: Diagnosis not present

## 2023-06-14 NOTE — Progress Notes (Signed)
  Subjective:  Patient ID: Taylor Shaw, female    DOB: 04-Feb-1941,  MRN: [id number]  83 y.o. female presents to clinic with  at risk foot care with history of diabetic neuropathy and callus(es) of both feet and painful thick toenails that are difficult to trim. Painful toenails interfere with ambulation. Aggravating factors include wearing enclosed shoe gear. Pain is relieved with periodic professional debridement. Painful calluses are aggravated when weightbearing with and without shoegear. Pain is relieved with periodic professional debridement.  New problem(s): None   PCP is Hope Merle, MD.  No Known Allergies  Review of Systems: Negative except as noted in the HPI.   Objective:  ELLENE Shaw is a pleasant 83 y.o. female in NAD.SABRA AAO x 3.  Vascular Examination: Vascular status intact b/l with palpable pedal pulses. CFT immediate b/l. No edema. No pain with calf compression b/l. Skin temperature gradient WNL b/l. Pedal hair present.  Neurological Examination: Sensation grossly intact b/l with 10 gram monofilament. Vibratory sensation intact b/l.   Dermatological Examination: Pedal skin with normal turgor, texture and tone b/l. Toenails 1-5 b/l thick, discolored, elongated with subungual debris and pain on dorsal palpation. Hyperkeratotic lesion(s) submet head 3 b/l.  No erythema, no edema, no drainage, no fluctuance.  Musculoskeletal Examination: Muscle strength 5/5 to b/l LE. No pain, crepitus or joint limitation noted with ROM bilateral LE. No gross bony deformities bilaterally. Utilizes cane for ambulation assistance.  Radiographs: None  Last A1c:      Latest Ref Rng & Units 05/28/2023   11:27 AM 11/16/2022    7:50 AM  Hemoglobin A1C  Hemoglobin-A1c 4.0 - 5.6 % 6.1  6.0      Assessment:   1. Pain due to onychomycosis of toenails of both feet   2. Callus   3. Diabetic peripheral neuropathy associated with type 2 diabetes mellitus (HCC)     Plan:  -Patient  was evaluated today. All questions/concerns addressed on today's visit. -Continue foot and shoe inspections daily. Monitor blood glucose per PCP/Endocrinologist's recommendations. -Patient to continue soft, supportive shoe gear daily. -Mycotic toenails 1-5 bilaterally were debrided in length and girth with sterile nail nippers and dremel without incident. -Callus(es) submet head 3 b/l pared utilizing sterile scalpel blade without complication or incident. Total number debrided =2. -Patient/POA to call should there be question/concern in the interim.  Return in about 3 months (around 09/09/2023).  Delon LITTIE Merlin, DPM      [facility] LOCATION: [address], [place] [address]                   Office [phone]   [facility] LOCATION: [address] [place], [place] [address] Office [phone]

## 2023-06-14 NOTE — Progress Notes (Signed)
 Rescheduled to 11:15 am slot.

## 2023-08-28 ENCOUNTER — Other Ambulatory Visit: Payer: Self-pay | Admitting: Family Medicine

## 2023-08-28 DIAGNOSIS — Z1231 Encounter for screening mammogram for malignant neoplasm of breast: Secondary | ICD-10-CM

## 2023-08-28 DIAGNOSIS — R928 Other abnormal and inconclusive findings on diagnostic imaging of breast: Secondary | ICD-10-CM

## 2023-09-10 ENCOUNTER — Ambulatory Visit: Payer: Medicare HMO | Admitting: Podiatry

## 2023-09-10 ENCOUNTER — Encounter: Payer: Self-pay | Admitting: Podiatry

## 2023-09-10 DIAGNOSIS — M79675 Pain in left toe(s): Secondary | ICD-10-CM | POA: Diagnosis not present

## 2023-09-10 DIAGNOSIS — B351 Tinea unguium: Secondary | ICD-10-CM | POA: Diagnosis not present

## 2023-09-10 DIAGNOSIS — M79674 Pain in right toe(s): Secondary | ICD-10-CM | POA: Diagnosis not present

## 2023-09-10 DIAGNOSIS — E119 Type 2 diabetes mellitus without complications: Secondary | ICD-10-CM

## 2023-09-10 DIAGNOSIS — L84 Corns and callosities: Secondary | ICD-10-CM | POA: Diagnosis not present

## 2023-09-10 NOTE — Progress Notes (Unsigned)
  Subjective:  Patient ID: Taylor Shaw, female    DOB: 19-Nov-1940,  MRN: [id number]  83 y.o. female presents preventative diabetic foot care and callus(es) b/l feet and painful thick toenails that are difficult to trim. Painful toenails interfere with ambulation. Aggravating factors include wearing enclosed shoe gear. Pain is relieved with periodic professional debridement. Painful calluses are aggravated when weightbearing with and without shoegear. Pain is relieved with periodic professional debridement.  Chief Complaint  Patient presents with   Diabetes    "Exam and cut my toenails."  Dr. Dana Allan - 05/28/2023; Alc - 6.1   New problem(s): None   PCP is Dana Allan, MD , and last visit was May 28, 2023.  No Known Allergies  Review of Systems: Negative except as noted in the HPI.   Objective:  Taylor Shaw is a pleasant 83 y.o. female obese in NAD. AAO x 3.  Vascular Examination: Vascular status intact b/l with palpable pedal pulses. CFT immediate b/l. Pedal hair present. No edema. No pain with calf compression b/l. Skin temperature gradient WNL b/l. No varicosities noted. No cyanosis or clubbing noted.  Neurological Examination: Sensation grossly intact b/l with 10 gram monofilament. Vibratory sensation intact b/l.  Dermatological Examination: Pedal skin with normal turgor, texture and tone b/l. Toenails 1-5 b/l thick, discolored, elongated with subungual debris and pain on dorsal palpation. Hyperkeratotic lesion(s) submet head 3 b/l.  No erythema, no edema, no drainage, no fluctuance.  Musculoskeletal Examination: Muscle strength 5/5 to b/l LE.  No pain, crepitus noted b/l. No gross pedal deformities. Patient ambulates independently without assistive aids.   Radiographs: None  Last A1c:      Latest Ref Rng & Units 05/28/2023   11:27 AM 11/16/2022    7:50 AM  Hemoglobin A1C  Hemoglobin-A1c 4.0 - 5.6 % 6.1  6.0     Assessment:   1. Pain due to  onychomycosis of toenails of both feet   2. Callus   3. Type 2 diabetes mellitus without complication, without long-term current use of insulin (HCC)    Plan:  Consent given for treatment. Patient examined.All patient's and/or POA's questions/concerns addressed on today's visit. Toenails 1-5 debrided in length and girth without incident. Callus(es) submet head 3 left foot and submet head 3 right foot pared with sharp debridement without incident. Continue foot and shoe inspections daily. Monitor blood glucose per PCP/Endocrinologist's recommendations.Continue soft, supportive shoe gear daily. Report any pedal injuries to medical professional. Call office if there are any questions/concerns. -Patient/POA to call should there be question/concern in the interim.  Return in about 3 months (around 12/10/2023).  Freddie Breech, DPM      [employer] LOCATION: [address], [place] [address]                   Office [phone]   [facility] LOCATION: [address] [place], [place] [address] Office [phone]

## 2023-09-12 IMAGING — MG MM DIGITAL SCREENING BILAT W/ TOMO AND CAD
6 of 10 series · 6 of 30 positions shown · non-contrast
Comparison: Previous exam(s).

CLINICAL DATA: Screening.

EXAM:
DIGITAL SCREENING BILATERAL MAMMOGRAM WITH TOMOSYNTHESIS AND CAD
TECHNIQUE: Bilateral screening digital craniocaudal and mediolateral oblique
mammograms were obtained. Bilateral screening digital breast
tomosynthesis was performed. The images were evaluated with
computer-aided detection.

[L CC synth-2D]
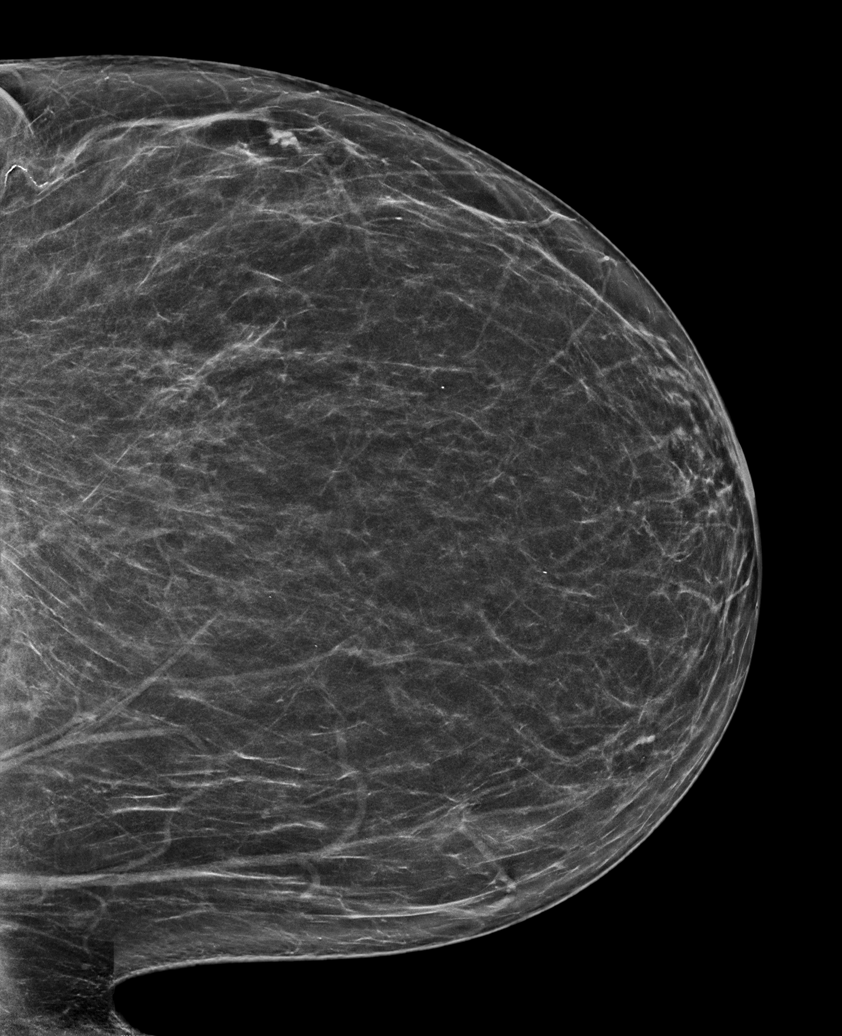

[R CC synth-2D]
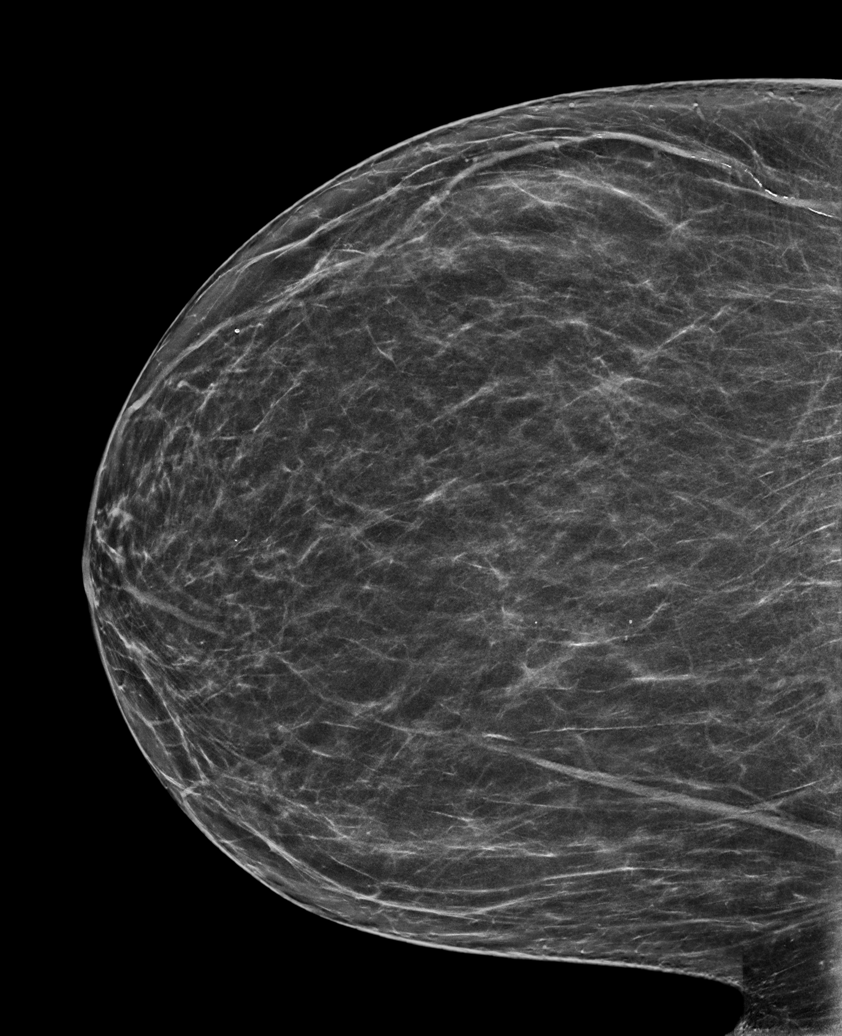

[R MLO synth-2D (1 of 2)]
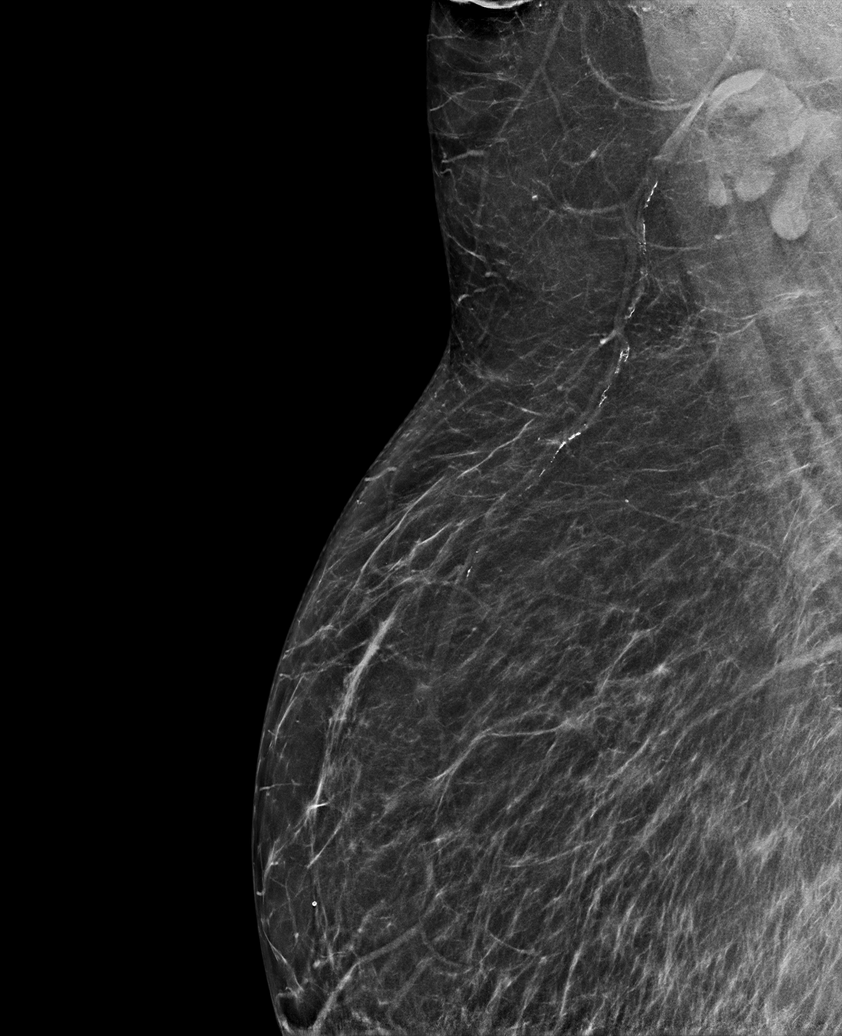

[R MLO synth-2D (2 of 2)]
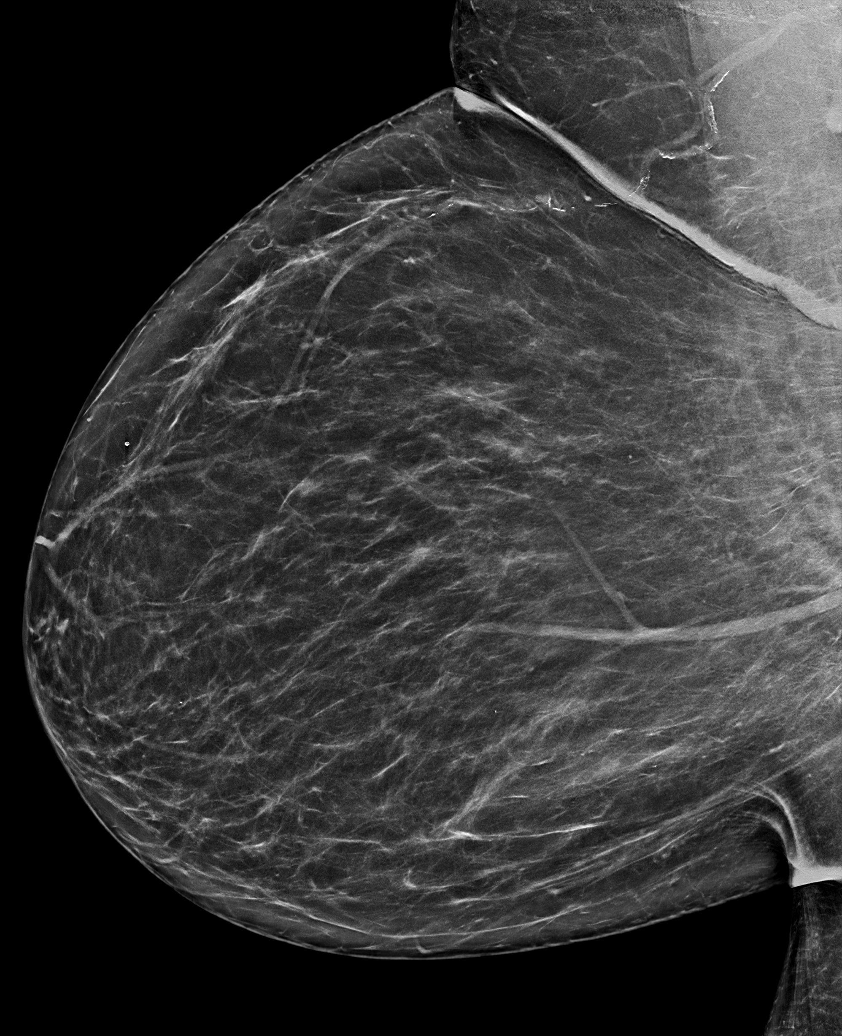

[L MLO synth-2D]
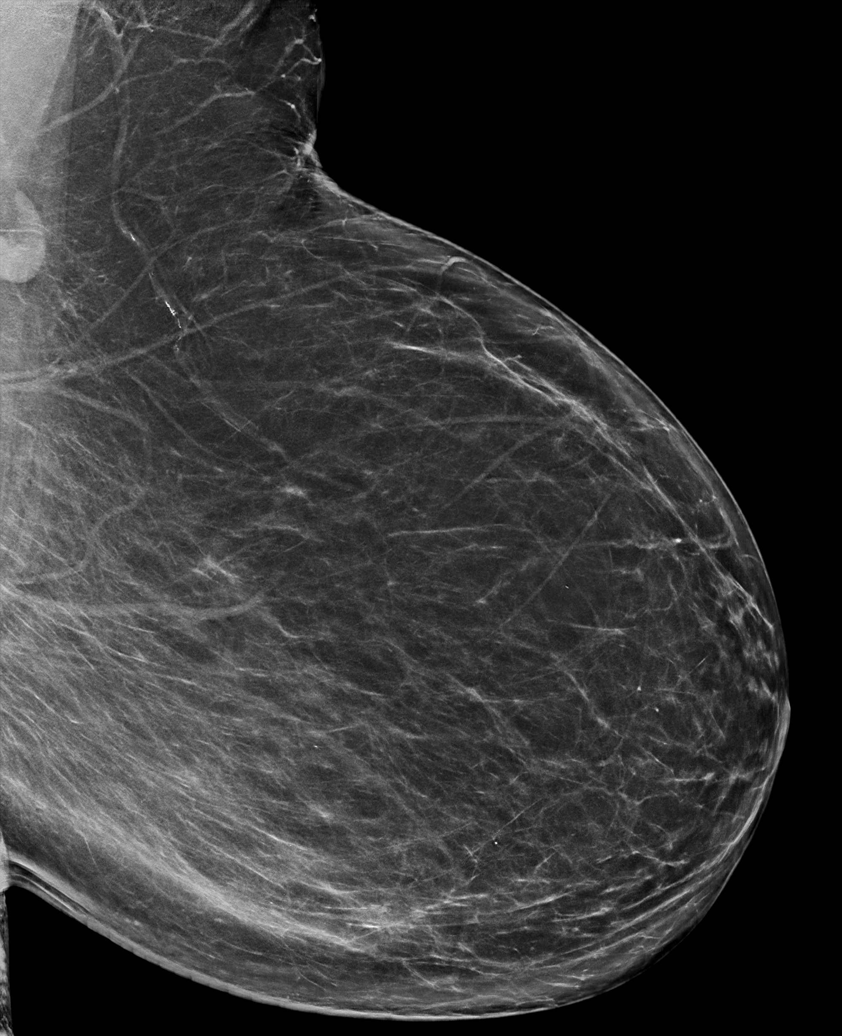

[R CC tomo · tomo slice 35/69.0]
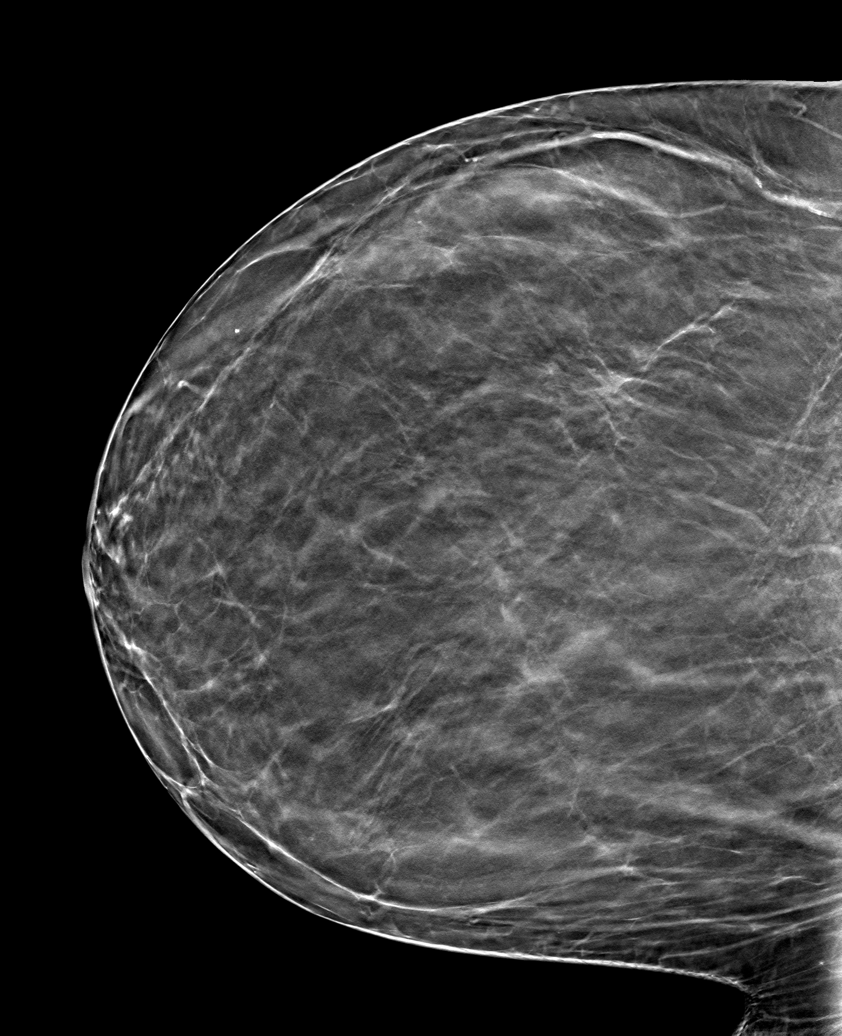

[6 of 30 positions shown; findings below may reference images not displayed]

ACR Breast Density Category b: There are scattered areas of
fibroglandular density.
FINDINGS: There are no findings suspicious for malignancy.
IMPRESSION: No mammographic evidence of malignancy. A result letter of this
screening mammogram will be mailed directly to the patient.

RECOMMENDATION:
Screening mammogram in one year. (Code:51-O-LD2)

BI-RADS CATEGORY  1: Negative.

## 2023-09-13 ENCOUNTER — Ambulatory Visit
Admission: RE | Admit: 2023-09-13 | Discharge: 2023-09-13 | Disposition: A | Discharge status: Home / Self Care | Source: Ambulatory Visit | Attending: Family Medicine | Admitting: Family Medicine

## 2023-09-13 DIAGNOSIS — Z1231 Encounter for screening mammogram for malignant neoplasm of breast: Secondary | ICD-10-CM | POA: Insufficient documentation

## 2023-09-14 ENCOUNTER — Encounter: Payer: Self-pay | Admitting: Podiatry

## 2023-09-17 ENCOUNTER — Other Ambulatory Visit: Payer: Self-pay | Admitting: Family Medicine

## 2023-09-17 DIAGNOSIS — R928 Other abnormal and inconclusive findings on diagnostic imaging of breast: Secondary | ICD-10-CM

## 2023-09-18 ENCOUNTER — Telehealth: Payer: Self-pay

## 2023-09-18 NOTE — Telephone Encounter (Signed)
 Called and spoke to the pt daughter who is on the [place], and advised her that the orders are in and the pt can call and schedule her appointment at [facility].

## 2023-09-18 NOTE — Telephone Encounter (Signed)
 Copied from CRM [id number]. Topic: Appointments - Appointment Cancel/Reschedule >> Sep 18, 2023  9:24 AM Howard Macho wrote: Patient/patient representative is calling to cancel or reschedule an appointment. Refer to attachments for appointment information.   Patient called stating she called [facility] and they stated they will have to have the doctor signature to set up the mammogram appointment. Patient did no know if they found something on the right side or they may have seen something when they did the breast exam last week

## 2023-09-20 ENCOUNTER — Ambulatory Visit
Admission: RE | Admit: 2023-09-20 | Discharge: 2023-09-20 | Disposition: A | Discharge status: Home / Self Care | Source: Ambulatory Visit | Attending: Family Medicine | Admitting: Family Medicine

## 2023-09-20 DIAGNOSIS — R928 Other abnormal and inconclusive findings on diagnostic imaging of breast: Secondary | ICD-10-CM | POA: Insufficient documentation

## 2023-09-20 DIAGNOSIS — R92321 Mammographic fibroglandular density, right breast: Secondary | ICD-10-CM | POA: Diagnosis not present

## 2023-10-31 ENCOUNTER — Encounter: Payer: Self-pay | Admitting: Family Medicine

## 2023-11-02 ENCOUNTER — Other Ambulatory Visit: Payer: Self-pay | Admitting: Family Medicine

## 2023-11-02 DIAGNOSIS — E039 Hypothyroidism, unspecified: Secondary | ICD-10-CM

## 2023-11-12 NOTE — Patient Instructions (Signed)
 It was a pleasure meeting you today. Thank you for allowing me to take part in your health care.  Our goals for today as we discussed include:  Refills sent for requested medications  We will get some labs today.  If they are abnormal or we need to do something about them, I will call you.  If they are normal, I will send you a message on MyChart (if it is active) or a letter in the mail.  If you don't hear from us  in 2 weeks, please call the office at the number below.    This is a list of the screening recommended for you and due dates:  Health Maintenance  Topic Date Due   Medicare Annual Wellness Visit  05/09/2022   COVID-19 Vaccine (5 - 2024-25 season) 02/04/2023   Eye exam for diabetics  10/27/2023   Hemoglobin A1C  11/26/2023   Flu Shot  01/04/2024   Complete foot exam   03/08/2024   Yearly kidney function blood test for diabetes  05/27/2024   Yearly kidney health urinalysis for diabetes  05/27/2024   Mammogram  09/12/2024   DTaP/Tdap/Td vaccine (3 - Td or Tdap) 03/27/2029   Pneumonia Vaccine  Completed   DEXA scan (bone density measurement)  Completed   Zoster (Shingles) Vaccine  Completed   HPV Vaccine  Aged Out   Meningitis B Vaccine  Aged Out      If you have any questions or concerns, please do not hesitate to call the office at [phone].  I look forward to our next visit and until then take care and stay safe.  Regards,   Valli Gaw, MD   [facility]

## 2023-11-13 ENCOUNTER — Ambulatory Visit: Payer: Self-pay | Admitting: Family Medicine

## 2023-11-13 ENCOUNTER — Encounter: Payer: Self-pay | Admitting: Family Medicine

## 2023-11-13 ENCOUNTER — Ambulatory Visit (INDEPENDENT_AMBULATORY_CARE_PROVIDER_SITE_OTHER): Admitting: Family Medicine

## 2023-11-13 VITALS — BP 122/70 | HR 53 | Temp 97.8°F | Resp 20 | Ht 64.0 in | Wt 229.0 lb

## 2023-11-13 DIAGNOSIS — E118 Type 2 diabetes mellitus with unspecified complications: Secondary | ICD-10-CM

## 2023-11-13 DIAGNOSIS — E039 Hypothyroidism, unspecified: Secondary | ICD-10-CM

## 2023-11-13 DIAGNOSIS — E1169 Type 2 diabetes mellitus with other specified complication: Secondary | ICD-10-CM | POA: Diagnosis not present

## 2023-11-13 DIAGNOSIS — Z6841 Body Mass Index (BMI) 40.0 and over, adult: Secondary | ICD-10-CM

## 2023-11-13 DIAGNOSIS — Z1231 Encounter for screening mammogram for malignant neoplasm of breast: Secondary | ICD-10-CM

## 2023-11-13 DIAGNOSIS — E1159 Type 2 diabetes mellitus with other circulatory complications: Secondary | ICD-10-CM

## 2023-11-13 DIAGNOSIS — I152 Hypertension secondary to endocrine disorders: Secondary | ICD-10-CM

## 2023-11-13 DIAGNOSIS — K219 Gastro-esophageal reflux disease without esophagitis: Secondary | ICD-10-CM

## 2023-11-13 DIAGNOSIS — E538 Deficiency of other specified B group vitamins: Secondary | ICD-10-CM | POA: Diagnosis not present

## 2023-11-13 DIAGNOSIS — E785 Hyperlipidemia, unspecified: Secondary | ICD-10-CM

## 2023-11-13 DIAGNOSIS — Z7984 Long term (current) use of oral hypoglycemic drugs: Secondary | ICD-10-CM

## 2023-11-13 LAB — POCT GLYCOSYLATED HEMOGLOBIN (HGB A1C): Hemoglobin A1C: 5.9 % — AB (ref 4.0–5.6)

## 2023-11-13 MED ORDER — HYDRALAZINE HCL 10 MG PO TABS
10.0000 mg | ORAL_TABLET | Freq: Two times a day (BID) | ORAL | 3 refills | Status: AC
Start: 2023-11-13 — End: ?

## 2023-11-13 MED ORDER — CARVEDILOL 25 MG PO TABS: 25.0000 mg | ORAL_TABLET | Freq: Two times a day (BID) | ORAL | 3 refills | Status: AC

## 2023-11-13 MED ORDER — METFORMIN HCL 500 MG PO TABS: ORAL_TABLET | ORAL | 3 refills | Status: AC

## 2023-11-13 MED ORDER — OMEPRAZOLE 20 MG PO CPDR: 20.0000 mg | DELAYED_RELEASE_CAPSULE | Freq: Every day | ORAL | 3 refills | Status: AC

## 2023-11-13 MED ORDER — AMLODIPINE BESYLATE 10 MG PO TABS: 10.0000 mg | ORAL_TABLET | Freq: Every day | ORAL | 3 refills | Status: AC

## 2023-11-13 MED ORDER — LOSARTAN POTASSIUM-HCTZ 100-25 MG PO TABS
1.0000 | ORAL_TABLET | Freq: Every day | ORAL | 3 refills | Status: AC
Start: 2023-11-13 — End: ?

## 2023-11-13 MED ORDER — ATORVASTATIN CALCIUM 10 MG PO TABS: 10.0000 mg | ORAL_TABLET | Freq: Every day | ORAL | 3 refills | Status: AC

## 2023-11-13 NOTE — Assessment & Plan Note (Signed)
 Well controlled, no symptoms of end-organ damage. Does not check BP at home. - Refill Amlodipine  10 mg daily - Refill Carvedilol  25 mg BID - Refill Hydralazine  10 mg BID - Refill Hyzaa5 100-25 mg daily -

## 2023-11-13 NOTE — Progress Notes (Signed)
 SUBJECTIVE:   Chief Complaint  Patient presents with   Diabetes    6 month follow up   HPI Presents for follow up chronic disease management  Discussed the use of AI scribe software for clinical note transcription with the patient, who gave verbal consent to proceed.  History of Present Illness Taylor Shaw is an 83 year old female with hypothyroidism who presents for follow-up regarding her thyroid  medication management.  She has not been taking her levothyroxine  for approximately six months due to a notice from the pharmacy to discontinue the medication and a subsequent shortage. She was concerned about potential interactions with another medication, requiring a 30-minute to one-hour gap between doses. During this period, she experienced no chest pain, shortness of breath, heart palpitations, or fatigue.  She is currently on metformin  500 mg daily for diabetes management and reports no issues tolerating the medication. She has experienced some weight loss, now weighing 229 pounds, which she attributes to a decreased appetite and smaller portion sizes. She does not actively try to lose weight but notes changes in her eating habits.  Her social activities include quilting and walking regularly. She also has a small dog that keeps her active, requiring walks multiple times a day. She mentions concern for her mother, who has worsening dementia.     PERTINENT PMH / PSH: As above  OBJECTIVE:  BP 122/70   Pulse (!) 53   Temp 97.8 F (36.6 C)   Resp 20   Ht 5\' 4"  (1.626 m)   Wt 229 lb (103.9 kg)   SpO2 96%   BMI 39.31 kg/m    Physical Exam Vitals reviewed.  Constitutional:      General: She is not in acute distress.    Appearance: Normal appearance. She is obese. She is not ill-appearing, toxic-appearing or diaphoretic.  Eyes:     General:        Right eye: No discharge.        Left eye: No discharge.     Conjunctiva/sclera: Conjunctivae normal.  Cardiovascular:      Rate and Rhythm: Normal rate and regular rhythm.     Heart sounds: Normal heart sounds.  Pulmonary:     Effort: Pulmonary effort is normal.     Breath sounds: Normal breath sounds.  Abdominal:     General: Bowel sounds are normal.  Musculoskeletal:        General: Normal range of motion.  Skin:    General: Skin is warm and dry.  Neurological:     General: No focal deficit present.     Mental Status: She is alert and oriented to person, place, and time. Mental status is at baseline.  Psychiatric:        Mood and Affect: Mood normal.        Behavior: Behavior normal.        Thought Content: Thought content normal.        Judgment: Judgment normal.           11/13/2023   11:17 AM 05/28/2023   11:12 AM 04/30/2023   10:28 AM 11/23/2022   10:23 AM 05/24/2022   11:13 AM  Depression screen PHQ 2/9  Decreased Interest 0 0 0 0 0  Down, Depressed, Hopeless 0 0 0 0 0  PHQ - 2 Score 0 0 0 0 0  Altered sleeping 0 0   0  Tired, decreased energy 0 0   0  Change in appetite 0 0  0  Feeling bad or failure about yourself  0 0   0  Trouble concentrating 0 0   0  Moving slowly or fidgety/restless 0 0   0  Suicidal thoughts 0 0   0  PHQ-9 Score 0 0   0  Difficult doing work/chores Not difficult at all Not difficult at all   Not difficult at all      11/13/2023   11:18 AM 05/28/2023   11:12 AM 05/24/2022   11:14 AM 01/14/2020    9:14 AM  GAD 7 : Generalized Anxiety Score  Nervous, Anxious, on Edge 0 0 0 0  Control/stop worrying 0 0 0 0  Worry too much - different things 0 0 0 0  Trouble relaxing 0 0 0 0  Restless 0 0 0 0  Easily annoyed or irritable 0 0 0 0  Afraid - awful might happen 0 0 0 0  Total GAD 7 Score 0 0 0 0  Anxiety Difficulty Not difficult at all Not difficult at all Not difficult at all Not difficult at all    ASSESSMENT/PLAN:  Hypertension associated with diabetes [facility]) Assessment & Plan: Well controlled, no symptoms of end-organ damage. Does not check BP at  home. - Refill Amlodipine  10 mg daily - Refill Carvedilol  25 mg BID - Refill Hydralazine  10 mg BID - Refill Hyzaa5 100-25 mg daily -  Orders: -     Comprehensive metabolic panel with GFR -     amLODIPine  Besylate; Take 1 tablet (10 mg total) by mouth daily.  Dispense: 90 tablet; Refill: 3 -     hydrALAZINE  HCl; Take 1 tablet (10 mg total) by mouth in the morning and at bedtime.  Dispense: 180 tablet; Refill: 3 -     Carvedilol ; Take 1 tablet (25 mg total) by mouth 2 (two) times daily with a meal.  Dispense: 180 tablet; Refill: 3 -     Losartan  Potassium-HCTZ; Take 1 tablet by mouth daily. In am  Dispense: 90 tablet; Refill: 3  Type 2 diabetes with complication [facility]) Assessment & Plan: Chronic.  Well-controlled. A1c 5.9 today Refill metformin  500 mg daily Continue statin therapy Continue ARB therapy Eye and Foot exam up to date  Orders: -     POCT glycosylated hemoglobin (Hb A1C) -     metFORMIN  HCl; 1 pill daily in the am with food  Dispense: 90 tablet; Refill: 3  Vitamin B 12 deficiency -     Vitamin B12  Gastroesophageal reflux disease without esophagitis -     Omeprazole ; Take 1 capsule (20 mg total) by mouth daily. 30 min before food  Dispense: 90 capsule; Refill: 3  Hyperlipidemia associated with type 2 diabetes mellitus (HCC) Assessment & Plan: Chronic.  Stable.  On statin therapy and tolerating well.  No myalgias. Refill Lipitor 10 mg daily. -Check lipids  Orders: -     Atorvastatin  Calcium ; Take 1 tablet (10 mg total) by mouth daily at 6 PM.  Dispense: 90 tablet; Refill: 3 -     Lipid panel  Hypothyroidism, unspecified type Assessment & Plan: Levothyroxine  discontinued for six months due to pharmacy issue and interaction concerns. No significant symptom exacerbation noted. - Order thyroid  function tests for levothyroxine  dosage adjustment. - Restart levothyroxine  based on lab results. - Educated on taking levothyroxine  on an empty stomach, wait 30 minutes  before eating or other medications  Orders: -     TSH  Morbid obesity with BMI of 40.0-44.9, adult [facility]) Assessment &  Plan: BMI remains elevated.   Engages in walking and quilting. - Encourage continued physical activity. - Monitor weight and appetite changes.     PDMP reviewed  Return in about 6 months (around 05/14/2024) for PCP.  Valli Gaw, MD

## 2023-11-13 NOTE — Assessment & Plan Note (Addendum)
 BMI remains elevated.   Engages in walking and quilting. - Encourage continued physical activity. - Monitor weight and appetite changes.

## 2023-11-13 NOTE — Assessment & Plan Note (Signed)
 Levothyroxine  discontinued for six months due to pharmacy issue and interaction concerns. No significant symptom exacerbation noted. - Order thyroid  function tests for levothyroxine  dosage adjustment. - Restart levothyroxine  based on lab results. - Educated on taking levothyroxine  on an empty stomach, wait 30 minutes before eating or other medications

## 2023-11-13 NOTE — Assessment & Plan Note (Signed)
 Chronic.  Stable.  On statin therapy and tolerating well.  No myalgias. Refill Lipitor 10 mg daily. -Check lipids

## 2023-11-13 NOTE — Assessment & Plan Note (Signed)
 Chronic.  Well-controlled. A1c 5.9 today Refill metformin  500 mg daily Continue statin therapy Continue ARB therapy Eye and Foot exam up to date

## 2023-11-14 LAB — LIPID PANEL
Chol/HDL Ratio: 2.3 ratio (ref 0.0–4.4)
Cholesterol, Total: 143 mg/dL (ref 100–199)
HDL: 62 mg/dL (ref >39)
LDL Chol Calc (NIH): 69 mg/dL (ref 0–99)
Triglycerides: 59 mg/dL (ref 0–149)
VLDL Cholesterol Cal: 12 mg/dL (ref 5–40)

## 2023-11-14 LAB — TSH: TSH: 3.42 u[IU]/mL (ref 0.450–4.500)

## 2023-11-14 LAB — COMPREHENSIVE METABOLIC PANEL WITH GFR
ALT: 6 IU/L (ref 0–32)
AST: 14 IU/L (ref 0–40)
Albumin: 3.9 g/dL (ref 3.7–4.7)
Alkaline Phosphatase: 65 IU/L (ref 44–121)
BUN/Creatinine Ratio: 18 (ref 12–28)
BUN: 14 mg/dL (ref 8–27)
Bilirubin Total: 0.6 mg/dL (ref 0.0–1.2)
CO2: 25 mmol/L (ref 20–29)
Calcium: 9.1 mg/dL (ref 8.7–10.3)
Chloride: 102 mmol/L (ref 96–106)
Creatinine, Ser: 0.79 mg/dL (ref 0.57–1.00)
Globulin, Total: 2.4 g/dL (ref 1.5–4.5)
Glucose: 94 mg/dL (ref 70–99)
Potassium: 3.8 mmol/L (ref 3.5–5.2)
Sodium: 142 mmol/L (ref 134–144)
Total Protein: 6.3 g/dL (ref 6.0–8.5)
eGFR: 75 mL/min/{1.73_m2} (ref >59)

## 2023-11-14 LAB — VITAMIN B12: Vitamin B-12: 934 pg/mL (ref 232–1245)

## 2023-11-16 NOTE — Telephone Encounter (Signed)
 Copied from CRM [id number]. Topic: General - Call Back - No Documentation >> Nov 16, 2023  1:15 PM Adrionna Y wrote: Reason for CRM: Patient is calling because she missed a call from cma kelly Not showing any information about the call  She can be reached on her home number

## 2023-11-22 ENCOUNTER — Telehealth: Payer: Self-pay | Admitting: Family Medicine

## 2023-11-22 NOTE — Telephone Encounter (Signed)
 Noted

## 2023-11-22 NOTE — Telephone Encounter (Signed)
 er

## 2023-11-23 ENCOUNTER — Ambulatory Visit: Admitting: Family Medicine

## 2023-11-26 ENCOUNTER — Ambulatory Visit: Payer: Medicare HMO | Admitting: Family Medicine

## 2023-12-05 ENCOUNTER — Ambulatory Visit (INDEPENDENT_AMBULATORY_CARE_PROVIDER_SITE_OTHER): Admitting: *Deleted

## 2023-12-05 VITALS — Ht 64.0 in | Wt 229.0 lb

## 2023-12-05 DIAGNOSIS — Z0000 Encounter for general adult medical examination without abnormal findings: Secondary | ICD-10-CM | POA: Diagnosis not present

## 2023-12-05 NOTE — Patient Instructions (Signed)
 Taylor Shaw , Thank you for taking time out of your busy schedule to complete your Annual Wellness Visit with me. I enjoyed our conversation and look forward to speaking with you again next year. I, as well as your care team,  appreciate your ongoing commitment to your health goals. Please review the following plan we discussed and let me know if I can assist you in the future. Your Game plan/ To Do List    Referrals: If you haven't heard from the office you've been referred to, please reach out to them at the phone provided.  Remember to update your covid vaccine and keep your eye appointment that is scheduled. Follow up Visits: Next Medicare AWV with our clinical staff: 12/10/24 @ 10:50   Have you seen your provider in the last 6 months (3 months if uncontrolled diabetes)? Yes Next Office Visit with your provider: 01/25/24  Clinician Recommendations:  Aim for 30 minutes of exercise or brisk walking, 6-8 glasses of water , and 5 servings of fruits and vegetables each day.       This is a list of the screening recommended for you and due dates:  Health Maintenance  Topic Date Due   Yearly kidney health urinalysis for diabetes  05/03/2022   COVID-19 Vaccine (5 - 2024-25 season) 02/04/2023   Eye exam for diabetics  10/27/2023   Flu Shot  01/04/2024   Complete foot exam   03/08/2024   Hemoglobin A1C  05/14/2024   Mammogram  09/12/2024   Yearly kidney function blood test for diabetes  11/12/2024   Medicare Annual Wellness Visit  12/04/2024   DTaP/Tdap/Td vaccine (3 - Td or Tdap) 03/27/2029   Pneumococcal Vaccine for age over 54  Completed   DEXA scan (bone density measurement)  Completed   Zoster (Shingles) Vaccine  Completed   Hepatitis B Vaccine  Aged Out   HPV Vaccine  Aged Out   Meningitis B Vaccine  Aged Out    Advanced directives: (Declined) Advance directive discussed with you today. Even though you declined this today, please call our office should you change your mind, and we can  give you the proper paperwork for you to fill out. Advance Care Planning is important because it:  [x]  Makes sure you receive the medical care that is consistent with your values, goals, and preferences  [x]  It provides guidance to your family and loved ones and reduces their decisional burden about whether or not they are making the right decisions based on your wishes.

## 2023-12-05 NOTE — Progress Notes (Signed)
 Subjective:   GARDENIA WITTER is a 83 y.o. who presents for a Medicare Wellness preventive visit.  As a reminder, Annual Wellness Visits don't include a physical exam, and some assessments may be limited, especially if this visit is performed virtually. We may recommend an in-person follow-up visit with your provider if needed.  Visit Complete: Virtual I connected with  Zula Hovsepian Foree on 12/05/23 by a audio enabled telemedicine application and verified that I am speaking with the correct person using two identifiers.  Patient Location: Home  Provider Location: Home Office  I discussed the limitations of evaluation and management by telemedicine. The patient expressed understanding and agreed to proceed.  Vital Signs: Because this visit was a virtual/telehealth visit, some criteria may be missing or patient reported. Any vitals not documented were not able to be obtained and vitals that have been documented are patient reported.  VideoDeclined- This patient declined Librarian, academic. Therefore the visit was completed with audio only.  Persons Participating in Visit: Patient.  AWV Questionnaire: No: Patient Medicare AWV questionnaire was not completed prior to this visit.  Cardiac Risk Factors include: advanced age (>28men, >25 women);diabetes mellitus;obesity (BMI >30kg/m2);dyslipidemia;hypertension     Objective:    Today's Vitals   12/05/23 0933  Weight: 229 lb (103.9 kg)  Height: 5' 4 (1.626 m)   Body mass index is 39.31 kg/m.     12/05/2023    9:50 AM 03/21/2022   10:28 AM 05/09/2021   11:33 AM 07/05/2020    6:33 AM 05/31/2020    6:49 AM 05/06/2020   11:15 AM 05/06/2019   11:23 AM  Advanced Directives  Does Patient Have a Medical Advance Directive? No No Yes Yes Yes Yes Yes  Type of Surveyor, minerals;Living will Healthcare Power of [employer] of [place];Living will Healthcare Power of  [place];Living will  Does patient want to make changes to medical advance directive?   No - Patient declined No - Guardian declined Yes (MAU/Ambulatory/Procedural Areas - Information given) No - Patient declined No - Patient declined  Copy of Healthcare Power of Attorney in Chart?   No - copy requested No - copy requested  No - copy requested No - copy requested  Would patient like information on creating a medical advance directive? No - Patient declined          Current Medications (verified) Outpatient Encounter Medications as of 12/05/2023  Medication Sig   amLODipine  (NORVASC ) 10 MG tablet Take 1 tablet (10 mg total) by mouth daily.   atorvastatin  (LIPITOR) 10 MG tablet Take 1 tablet (10 mg total) by mouth daily at 6 PM.   Calcium  Carb-Cholecalciferol 600-800 MG-UNIT TABS Take 1 tablet by mouth 2 (two) times daily.   carvedilol  (COREG ) 25 MG tablet Take 1 tablet (25 mg total) by mouth 2 (two) times daily with a meal.   docusate sodium  (COLACE) 100 MG capsule Take 100 mg by mouth 2 (two) times daily.   hydrALAZINE  (APRESOLINE ) 10 MG tablet Take 1 tablet (10 mg total) by mouth in the morning and at bedtime.   levothyroxine  (SYNTHROID ) 75 MCG tablet TAKE 1 TABLET (75 MCG TOTAL) BY MOUTH DAILY BEFORE BREAKFAST. 30 MINUTES BEFORE FOOD   losartan -hydrochlorothiazide  (HYZAAR) 100-25 MG tablet Take 1 tablet by mouth daily. In am   metFORMIN  (GLUCOPHAGE ) 500 MG tablet 1 pill daily in the am with food   Omega-3 Fatty Acids (FISH OIL) 1000 MG CAPS Take  1,000 mg by mouth 2 (two) times daily.   omeprazole  (PRILOSEC) 20 MG capsule Take 1 capsule (20 mg total) by mouth daily. 30 min before food   No facility-administered encounter medications on file as of 12/05/2023.    Allergies (verified) Patient has no known allergies.   History: Past Medical History:  Diagnosis Date   Abnormal thyroid  function test 10/14/2019   Anal fissure    Arthritis    Arthritis 01/15/2015   Benign neoplasm of ascending  colon    Dental abscess 06/02/2022   Diabetes (HCC) 02/19/2015   Diverticulosis 12/07/2016   Sigmoid Colon   Diverticulosis of sigmoid colon 12/08/2016   Diverticulosis of sigmoid colon 12/08/2016   Elevated blood sugar 01/15/2015   GERD (gastroesophageal reflux disease)    Hemorrhoids    History of blood transfusion    Hyperlipidemia    Hypertension 01/15/2015   Hypertension 01/15/2015   Internal hemorrhoids 04/03/2018   Leg edema 02/04/2019   Neck pain 04/03/2018   Obesity 01/15/2015   Obesity, diabetes, and hypertension syndrome (HCC) 01/14/2020   OSA on CPAP    Personal history of colonic polyps    Polyp of descending colon    Umbilical hernia without obstruction and without gangrene 10/26/2020   UTI (urinary tract infection)    Past Surgical History:  Procedure Laterality Date   BOTOX  INJECTION N/A 03/27/2018   Procedure: BOTOX  INJECTION;  Surgeon: Jordis Laneta FALCON, MD;  Location: [facility];  Service: General;  Laterality: N/A;   BREAST BIOPSY Right 09/26/2022   rt br stereo coil clip path pending   BREAST BIOPSY Right 09/26/2022   MM RT BREAST BX W LOC DEV 1ST LESION IMAGE BX SPEC STEREO GUIDE 09/26/2022 [facility]   CATARACT EXTRACTION W/PHACO Left 05/31/2020   Procedure: CATARACT EXTRACTION PHACO AND INTRAOCULAR LENS PLACEMENT (IOC) LEFT;  Surgeon: Myrna Adine Anes, MD;  Location: [facility];  Service: Ophthalmology;  Laterality: Left;  4.16 0:40.4   CATARACT EXTRACTION W/PHACO Right 07/05/2020   Procedure: CATARACT EXTRACTION PHACO AND INTRAOCULAR LENS PLACEMENT (IOC) RIGHT DIABETIC 3.20 00:26.3;  Surgeon: Myrna Adine Anes, MD;  Location: [facility];  Service: Ophthalmology;  Laterality: Right;  Diabetic - oral meds   COLONOSCOPY     COLONOSCOPY WITH PROPOFOL  N/A 12/07/2016   Procedure: COLONOSCOPY WITH PROPOFOL ;  Surgeon: Jinny Carmine, MD;  Location: [facility];  Service: Endoscopy;  Laterality: N/A;  Diabetic - oral meds    COLONOSCOPY WITH PROPOFOL  N/A 03/21/2022   Procedure: COLONOSCOPY WITH PROPOFOL ;  Surgeon: Jinny Carmine, MD;  Location: [facility];  Service: Endoscopy;  Laterality: N/A;   EVALUATION UNDER ANESTHESIA WITH ANAL FISSUROTOMY N/A 03/27/2018   Procedure: EXAM UNDER ANESTHESIA, CHEMICAL SPHINCTEROTOMY;  Surgeon: Jordis Laneta FALCON, MD;  Location: [facility];  Service: General;  Laterality: N/A;   HEMORRHOID SURGERY N/A 06/13/2018   Procedure: HEMORRHOIDECTOMY;  Surgeon: Jordis Laneta FALCON, MD;  Location: [facility];  Service: General;  Laterality: N/A;   JOINT REPLACEMENT     b/l hips in [place] 2004 and 2005 Dr. Tess    POLYPECTOMY  12/07/2016   Procedure: POLYPECTOMY;  Surgeon: Jinny Carmine, MD;  Location: [facility];  Service: Endoscopy;;   RECTAL EXAM UNDER ANESTHESIA N/A 06/13/2018   Procedure: RECTAL EXAM UNDER ANESTHESIA;  Surgeon: Jordis Laneta FALCON, MD;  Location: [facility];  Service: General;  Laterality: N/A;   TOTAL HIP ARTHROPLASTY Bilateral    TUBAL LIGATION     Family History  Problem Relation Age  of Onset   Dementia Mother    Hypertension Mother    Arthritis Mother    Alcohol abuse Father    Cirrhosis Father    Cancer Daughter        breast   COPD Brother    Breast cancer Sister 35   Cancer Sister        ? type    Dementia Sister    Cancer Brother        ?type   Cancer Brother        cancer ?type   Cancer Daughter        cancer ?type died 85    Alzheimer's disease Sister    Social History   Socioeconomic History   Marital status: Divorced    Spouse name: Not on file   Number of children: Not on file   Years of education: Not on file   Highest education level: Not on file  Occupational History   Occupation: Warehouse manager covers    Comment: retired  Tobacco Use   Smoking status: Never   Smokeless tobacco: Never   Tobacco comments:    Never   Vaping Use   Vaping status: Never Used  Substance and Sexual Activity   Alcohol use:  No    Alcohol/week: 0.0 standard drinks of alcohol   Drug use: No   Sexual activity: Not Currently  Other Topics Concern   Not on file  Social History Narrative   Lives with daughter Verdon Cowden to go to church    Moved from [place] 3-4 years ago    No guns, wears seat belt    Never smoker    Social Drivers of Corporate investment banker Strain: Low Risk  (12/05/2023)   Overall Financial Resource Strain (CARDIA)    Difficulty of Paying Living Expenses: Not hard at all  Food Insecurity: No Food Insecurity (12/05/2023)   Hunger Vital Sign    Worried About Running Out of Food in the Last Year: Never true    Ran Out of Food in the Last Year: Never true  Transportation Needs: No Transportation Needs (12/05/2023)   PRAPARE - Administrator, Civil Service (Medical): No    Lack of Transportation (Non-Medical): No  Physical Activity: Inactive (12/05/2023)   Exercise Vital Sign    Days of Exercise per Week: 0 days    Minutes of Exercise per Session: 0 min  Stress: No Stress Concern Present (12/05/2023)   [employer] of Occupational Health - Occupational Stress Questionnaire    Feeling of Stress: Not at all  Social Connections: Moderately Integrated (12/05/2023)   Social Connection and Isolation Panel    Frequency of Communication with Friends and Family: More than three times a week    Frequency of Social Gatherings with Friends and Family: More than three times a week    Attends Religious Services: More than 4 times per year    Active Member of [employer] or Organizations: Yes    Attends Engineer, structural: More than 4 times per year    Marital Status: Divorced    Tobacco Counseling Counseling given: Not Answered Tobacco comments: Never     Clinical Intake:  Pre-visit preparation completed: Yes  Pain : No/denies pain     BMI - recorded: 39.31 Nutritional Status: BMI > 30  Obese Nutritional Risks: None Diabetes: Yes CBG done?: No  Lab Results   Component Value Date   HGBA1C  5.9 (A) 11/13/2023   HGBA1C 6.1 (A) 05/28/2023   HGBA1C 6.0 11/16/2022     How often do you need to have someone help you when you read instructions, pamphlets, or other written materials from your doctor or pharmacy?: 1 - Never  Interpreter Needed?: No  Information entered by :: R. Diron Haddon LPN   Activities of Daily Living     12/05/2023    9:35 AM  In your present state of health, do you have any difficulty performing the following activities:  Hearing? 0  Vision? 0  Comment glasses  Difficulty concentrating or making decisions? 1  Comment at times  Walking or climbing stairs? 1  Comment stairs  Dressing or bathing? 0  Doing errands, shopping? 0  Preparing Food and eating ? N  Using the Toilet? N  In the past six months, have you accidently leaked urine? Y  Do you have problems with loss of bowel control? N  Managing your Medications? N  Managing your Finances? N  Housekeeping or managing your Housekeeping? N    Patient Care Team: Darron Deatrice LABOR, MD as PCP - Cardiology (Cardiology)  I have updated your Care Teams any recent Medical Services you may have received from other providers in the past year.     Assessment:   This is a routine wellness examination for Ladajah.  Hearing/Vision screen Hearing Screening - Comments:: No issues Vision Screening - Comments:: glasses   Goals Addressed             This Visit's Progress    Patient Stated       Wants to stay active and travel        Depression Screen     12/05/2023    9:45 AM 11/13/2023   11:17 AM 05/28/2023   11:12 AM 04/30/2023   10:28 AM 11/23/2022   10:23 AM 05/24/2022   11:13 AM 02/14/2022    9:33 AM  PHQ 2/9 Scores  PHQ - 2 Score 0 0 0 0 0 0 0  PHQ- 9 Score 0 0 0   0     Fall Risk     12/05/2023    9:39 AM 11/13/2023   11:17 AM 05/28/2023   11:12 AM 04/30/2023   10:28 AM 11/23/2022   10:23 AM  Fall Risk   Falls in the past year? 0 0 0 0 0  Number falls in  past yr: 0  0 0 0  Injury with Fall? 0 0 0 0 0  Risk for fall due to : No Fall Risks No Fall Risks No Fall Risks No Fall Risks No Fall Risks  Follow up Falls evaluation completed;Falls prevention discussed Falls evaluation completed;Education provided Falls evaluation completed Falls evaluation completed Falls evaluation completed    MEDICARE RISK AT HOME:  Medicare Risk at Home Any stairs in or around the home?: No If so, are there any without handrails?: No Home free of loose throw rugs in walkways, pet beds, electrical cords, etc?: Yes Adequate lighting in your home to reduce risk of falls?: Yes Life alert?: No Use of a cane, walker or w/c?: Yes Grab bars in the bathroom?: No Shower chair or bench in shower?: Yes Elevated toilet seat or a handicapped toilet?: No  TIMED UP AND GO:  Was the test performed?  No  Cognitive Function: 6CIT completed        12/05/2023    9:50 AM 05/09/2021   12:00 PM 05/06/2020   11:29 AM 05/06/2019  11:14 AM  6CIT Screen  What Year? 0 points 0 points 0 points 0 points  What month? 0 points 0 points 0 points 0 points  What time? 0 points  0 points 0 points  Count back from 20 0 points 0 points 0 points 0 points  Months in reverse 2 points 0 points  0 points  Repeat phrase 0 points 0 points  2 points  Total Score 2 points   2 points    Immunizations Immunization History  Administered Date(s) Administered   Fluad Quad(high Dose 65+) 02/04/2019, 05/27/2020, 05/03/2021, 02/14/2022   Fluad Trivalent(High Dose 65+) 05/28/2023   Influenza, High Dose Seasonal PF 02/19/2015, 04/03/2018   Influenza-Unspecified 03/16/2014, 02/27/2017   PFIZER(Purple Top)SARS-COV-2 Vaccination 07/09/2019, 07/30/2019   PNEUMOCOCCAL CONJUGATE-20 05/24/2022   Pfizer Covid-19 Vaccine Bivalent Booster 51yrs & up 01/07/2021   Pfizer(Comirnaty)Fall Seasonal Vaccine 12 years and older 03/17/2022   Pneumococcal Conjugate-13 07/04/2018   Pneumococcal-Unspecified 03/16/2014    Tdap 07/04/2018, 03/28/2019   Zoster Recombinant(Shingrix) 07/04/2018, 03/10/2019, 03/28/2019   Zoster, Live 04/16/2013    Screening Tests Health Maintenance  Topic Date Due   Diabetic kidney evaluation - Urine ACR  05/03/2022   Medicare Annual Wellness (AWV)  05/09/2022   COVID-19 Vaccine (5 - 2024-25 season) 02/04/2023   OPHTHALMOLOGY EXAM  10/27/2023   INFLUENZA VACCINE  01/04/2024   FOOT EXAM  03/08/2024   HEMOGLOBIN A1C  05/14/2024   MAMMOGRAM  09/12/2024   Diabetic kidney evaluation - eGFR measurement  11/12/2024   DTaP/Tdap/Td (3 - Td or Tdap) 03/27/2029   Pneumococcal Vaccine: 50+ Years  Completed   DEXA SCAN  Completed   Zoster Vaccines- Shingrix  Completed   Hepatitis B Vaccines  Aged Out   HPV VACCINES  Aged Out   Meningococcal B Vaccine  Aged Out    Health Maintenance  Health Maintenance Due  Topic Date Due   Diabetic kidney evaluation - Urine ACR  05/03/2022   Medicare Annual Wellness (AWV)  05/09/2022   COVID-19 Vaccine (5 - 2024-25 season) 02/04/2023   OPHTHALMOLOGY EXAM  10/27/2023   Health Maintenance Items Addressed: Discussed the need to update covid vaccine.   Additional Screening:  Vision Screening: Recommended annual ophthalmology exams for early detection of glaucoma and other disorders of the eye. Over due [facility] has an appointment 01/2024 Would you like a referral to an eye doctor? No    Dental Screening: Recommended annual dental exams for proper oral hygiene  Community Resource Referral / Chronic Care Management: CRR required this visit?  No   CCM required this visit?  No   Plan:    I have personally reviewed and noted the following in the patient's chart:   Medical and social history Use of alcohol, tobacco or illicit drugs  Current medications and supplements including opioid prescriptions. Patient is not currently taking opioid prescriptions. Functional ability and status Nutritional status Physical activity Advanced  directives List of other physicians Hospitalizations, surgeries, and ER visits in previous 12 months Vitals Screenings to include cognitive, depression, and falls Referrals and appointments  In addition, I have reviewed and discussed with patient certain preventive protocols, quality metrics, and best practice recommendations. A written personalized care plan for preventive services as well as general preventive health recommendations were provided to patient.   Angeline Fredericks, LPN   07/12/7972   After Visit Summary: (Pick Up) Due to this being a telephonic visit, with patients personalized plan was offered to patient and patient has requested to Pick  up at office.  Notes: Nothing significant to report at this time.

## 2023-12-14 ENCOUNTER — Ambulatory Visit: Admitting: Podiatry

## 2023-12-14 ENCOUNTER — Encounter: Payer: Self-pay | Admitting: Podiatry

## 2023-12-14 VITALS — Ht 64.0 in | Wt 229.0 lb

## 2023-12-14 DIAGNOSIS — M79675 Pain in left toe(s): Secondary | ICD-10-CM | POA: Diagnosis not present

## 2023-12-14 DIAGNOSIS — E1142 Type 2 diabetes mellitus with diabetic polyneuropathy: Secondary | ICD-10-CM | POA: Diagnosis not present

## 2023-12-14 DIAGNOSIS — B351 Tinea unguium: Secondary | ICD-10-CM | POA: Diagnosis not present

## 2023-12-14 DIAGNOSIS — M79674 Pain in right toe(s): Secondary | ICD-10-CM

## 2023-12-18 ENCOUNTER — Encounter: Payer: Self-pay | Admitting: Podiatry

## 2023-12-18 NOTE — Progress Notes (Signed)
  Subjective:  Patient ID: Taylor Shaw, female    DOB: 06-24-1940,  MRN: [id number]  Taylor Shaw presents to clinic today for at risk foot care with history of diabetic neuropathy and painful mycotic toenails x 10 which interfere with daily activities. Pain is relieved with periodic professional debridement.  Chief Complaint  Patient presents with   Nail Problem    Pt is here for [facility] unsure of last A1C PCP is Dr Hope and LOV was in June.   New problem(s): None.   PCP is No primary care provider on file.Taylor Shaw  No Known Allergies  Review of Systems: Negative except as noted in the HPI.  Objective: No changes noted in today's physical examination. There were no vitals filed for this visit. Taylor Shaw is a pleasant 83 y.o. female obese in NAD. AAO x 3.  Vascular Examination: Capillary refill time immediate b/l. Palpable pedal pulses. Pedal hair present b/l. No pain with calf compression b/l. Skin temperature gradient WNL b/l. No cyanosis or clubbing b/l. No ischemia or gangrene noted b/l. No edema noted b/l LE.  Neurological Examination: Sensation grossly intact b/l with 10 gram monofilament. Vibratory sensation intact b/l.   Dermatological Examination: Pedal skin with normal turgor, texture and tone b/l.  No open wounds. No interdigital macerations.   Toenails 1-5 b/l thick, discolored, elongated with subungual debris and pain on dorsal palpation.   Minimal hyperkeratos(is/es) noted submet head 3 b/l.  Musculoskeletal Examination: Muscle strength 5/5 to all lower extremity muscle groups bilaterally. No pain, crepitus or joint limitation noted with ROM b/l LE. No gross bony pedal deformities b/l. Patient ambulates independently without assistive aids.  Radiographs: None  Last A1c:      Latest Ref Rng & Units 11/13/2023   11:18 AM 05/28/2023   11:27 AM  Hemoglobin A1C  Hemoglobin-A1c 4.0 - 5.6 % 5.9  6.1    Assessment/Plan: 1. Pain due to onychomycosis of  toenails of both feet   2. Diabetic peripheral neuropathy associated with type 2 diabetes mellitus (HCC)   -Patient was evaluated today. All questions/concerns addressed on today's visit. -Patient to continue soft, supportive shoe gear daily. -Mycotic toenails 1-5 bilaterally were debrided in length and girth with sterile nail nippers without incident. -As a courtesy, callus(es) submet head 3 b/l gently filed without complication or incident. Total number pared=2. -Patient/POA to call should there be question/concern in the interim.  Return in about 3 months (around 03/15/2024).  Taylor Shaw, DPM      [place] LOCATION: [address], [place] [address]                   Office [phone]   [facility] LOCATION: [address] [place], [place] [address] Office [phone]

## 2024-01-14 ENCOUNTER — Ambulatory Visit: Admitting: Internal Medicine

## 2024-01-18 DIAGNOSIS — H26492 Other secondary cataract, left eye: Secondary | ICD-10-CM | POA: Diagnosis not present

## 2024-01-18 DIAGNOSIS — Z961 Presence of intraocular lens: Secondary | ICD-10-CM | POA: Diagnosis not present

## 2024-01-18 DIAGNOSIS — E113293 Type 2 diabetes mellitus with mild nonproliferative diabetic retinopathy without macular edema, bilateral: Secondary | ICD-10-CM | POA: Diagnosis not present

## 2024-01-18 LAB — HM DIABETES EYE EXAM

## 2024-01-25 ENCOUNTER — Encounter: Admitting: Nurse Practitioner

## 2024-02-21 DIAGNOSIS — H26492 Other secondary cataract, left eye: Secondary | ICD-10-CM | POA: Diagnosis not present

## 2024-03-04 NOTE — Progress Notes (Signed)
 Taylor Shaw                                          MRN: [id number]   03/04/2024   The VBCI Quality Team Specialist reviewed this patient medical record for the purposes of chart review for care gap closure. The following were reviewed: chart review for care gap closure-kidney health evaluation for diabetes:eGFR  and uACR.    VBCI Quality Team

## 2024-03-13 DIAGNOSIS — H26492 Other secondary cataract, left eye: Secondary | ICD-10-CM | POA: Diagnosis not present

## 2024-03-17 ENCOUNTER — Encounter: Payer: Self-pay | Admitting: Podiatry

## 2024-03-17 ENCOUNTER — Ambulatory Visit: Admitting: Podiatry

## 2024-03-17 DIAGNOSIS — E119 Type 2 diabetes mellitus without complications: Secondary | ICD-10-CM

## 2024-03-17 DIAGNOSIS — Z9989 Dependence on other enabling machines and devices: Secondary | ICD-10-CM

## 2024-03-17 DIAGNOSIS — E1142 Type 2 diabetes mellitus with diabetic polyneuropathy: Secondary | ICD-10-CM

## 2024-03-17 DIAGNOSIS — M2041 Other hammer toe(s) (acquired), right foot: Secondary | ICD-10-CM | POA: Diagnosis not present

## 2024-03-17 DIAGNOSIS — B351 Tinea unguium: Secondary | ICD-10-CM

## 2024-03-17 DIAGNOSIS — Z0189 Encounter for other specified special examinations: Secondary | ICD-10-CM | POA: Diagnosis not present

## 2024-03-17 DIAGNOSIS — M2042 Other hammer toe(s) (acquired), left foot: Secondary | ICD-10-CM

## 2024-03-17 DIAGNOSIS — M79675 Pain in left toe(s): Secondary | ICD-10-CM | POA: Diagnosis not present

## 2024-03-17 DIAGNOSIS — M79674 Pain in right toe(s): Secondary | ICD-10-CM

## 2024-03-17 DIAGNOSIS — L84 Corns and callosities: Secondary | ICD-10-CM | POA: Diagnosis not present

## 2024-03-17 NOTE — Progress Notes (Signed)
  Subjective:  Patient ID: Taylor Shaw, female    DOB: Apr 05, 1941,  MRN: [id number]  Taylor Shaw presents to clinic today for for annual diabetic foot examination, at risk foot care with history of diabetic neuropathy, and callus(es) of both feet. Aggravating factors include weightbearing with and without shoe gear. Pain is relieved with periodic professional debridement. She states she drove herself to today's appointment. Chief Complaint  Patient presents with   Toe Pain    RFC.  She had a visit with  Dr. Hope at [facility] in  June 2025. She is uncertain of her A1c. Per the record it is 5.9   New problem(s): None.   PCP is No primary care provider on file.SABRA  No Known Allergies  Review of Systems: Negative except as noted in the HPI.  Objective: No changes noted in today's physical examination. There were no vitals filed for this visit. Taylor Shaw is a pleasant 83 y.o. female in NAD. AAO x 3.   Diabetic foot exam was performed with the following findings:   Intact posterior tibialis and dorsalis pedis pulses Vascular Examination: Capillary refill time immediate b/l. Vascular status intact b/l with palpable pedal pulses. Pedal hair present b/l. No pain with calf compression b/l. Skin temperature gradient WNL b/l. No cyanosis or clubbing b/l. No ischemia or gangrene noted b/l. No edema noted b/l LE.  Neurological Examination: Sensation grossly intact b/l with 10 gram monofilament. Vibratory sensation intact b/l. Pt has subjective symptoms of neuropathy.  Dermatological Examination: Pedal skin with normal turgor, texture and tone b/l.  No open wounds. No interdigital macerations.   Toenails 1-5 b/l thick, discolored, elongated with subungual debris and pain on dorsal palpation.   Hyperkeratotic lesion(s) dorsal PIPJ of R 5th toe and submet head 2 right foot.  No erythema, no edema, no drainage, no fluctuance.  Musculoskeletal Examination: Muscle strength 5/5 to all  lower extremity muscle groups bilaterally. No pain, crepitus or joint limitation noted with ROM b/l LE. Hammertoe(s) bilateral 5th toes.. Patient ambulates with cane assistance.  Radiographs: None    Assessment/Plan: 1. Pain due to onychomycosis of toenails of both feet   2. Corns and callosities   3. Acquired hammertoes of both feet   4. Ambulates with cane   5. Diabetic peripheral neuropathy associated with type 2 diabetes mellitus (HCC)   6. Encounter for diabetic foot exam (HCC)   Diabetic foot examination performed today.  All patient's and/or POA's questions/concerns addressed on today's visit. Toenails 1-5 debrided in length and girth without incident. Corn(s) and Callus(es) dorsal PIPJ of R 5th toe and submet head 2 right foot pared with sharp debridement without incident. Continue daily foot inspections and monitor blood glucose per PCP/Endocrinologist's recommendations. Continue soft, supportive shoe gear daily. Report any pedal injuries to medical professional. Call office if there are any questions/concerns. Return in about 3 months (around 06/17/2024).  Taylor Shaw, DPM      [place] LOCATION: [address], [place] [address]                   Office [phone]   [facility] LOCATION: [address] [place], [place] [address] Office [phone]

## 2024-03-27 ENCOUNTER — Ambulatory Visit: Admitting: Nurse Practitioner

## 2024-03-27 ENCOUNTER — Encounter: Payer: Self-pay | Admitting: Nurse Practitioner

## 2024-03-27 VITALS — BP 142/78 | HR 59 | Ht 64.0 in | Wt 228.0 lb

## 2024-03-27 DIAGNOSIS — G4733 Obstructive sleep apnea (adult) (pediatric): Secondary | ICD-10-CM | POA: Diagnosis not present

## 2024-03-27 DIAGNOSIS — E669 Obesity, unspecified: Secondary | ICD-10-CM

## 2024-03-27 NOTE — Patient Instructions (Addendum)
 Continue to use CPAP every night, minimum of 4-6 hours a night.  Change equipment as directed. Wash your tubing with warm soap and water  daily, hang to dry. Wash humidifier portion weekly. Use bottled, distilled water  and change daily Be aware of reduced alertness and do not drive or operate heavy machinery if experiencing this or drowsiness.  Exercise encouraged, as tolerated. Healthy weight management discussed.  Avoid or decrease alcohol consumption and medications that make you more sleepy, if possible. Notify if persistent daytime sleepiness occurs even with consistent use of PAP therapy.  Change CPAP supplies... Every month Mask cushions and/or nasal pillows CPAP machine filters Every 3 months Mask frame (not including the headgear) CPAP tubing Every 6 months Mask headgear Chin strap (if applicable) Humidifier water  tub  Try mask liner for your hybrid full face CPAP mask. You can get these online at places like [employer]  Follow up in one year with Taylor Judah Chevere,NP, or sooner, if needed

## 2024-03-27 NOTE — Assessment & Plan Note (Signed)
 OSA on CPAP. Excellent compliance and control. Receives benefit from use. Aware of proper care/use of device. Advised to trial mask liners for skin protection. Otherwise, no concerns or complaints. Aware of risks of untreated OSA. Encouraged to continue utilizing nightly. Safe driving practices reviewed. Healthy weight loss encouraged.  Patient Instructions  Continue to use CPAP every night, minimum of 4-6 hours a night.  Change equipment as directed. Wash your tubing with warm soap and water  daily, hang to dry. Wash humidifier portion weekly. Use bottled, distilled water  and change daily Be aware of reduced alertness and do not drive or operate heavy machinery if experiencing this or drowsiness.  Exercise encouraged, as tolerated. Healthy weight management discussed.  Avoid or decrease alcohol consumption and medications that make you more sleepy, if possible. Notify if persistent daytime sleepiness occurs even with consistent use of PAP therapy.  Change CPAP supplies... Every month Mask cushions and/or nasal pillows CPAP machine filters Every 3 months Mask frame (not including the headgear) CPAP tubing Every 6 months Mask headgear Chin strap (if applicable) Humidifier water  tub  Try mask liner for your hybrid full face CPAP mask. You can get these online at places like [employer]  Follow up in one year with Katie Mackynzie Woolford,NP, or sooner, if needed

## 2024-03-27 NOTE — Progress Notes (Signed)
 @Patient  ID: Taylor Shaw, female    DOB: 23-Feb-1941, 83 y.o.   MRN: [id number]  Chief Complaint  Patient presents with   Shortness of Breath    Referring provider: No ref. provider found  HPI: 83 year old female, never smoker followed for OSA on CPAP. Last seen 03/2023. Past medical history significant for HTN, GERD, DM, hypothyroid, HLD.  TEST/EVENTS:  12/2010 PSG: sever OSA with AHI 39 12/29/2019 HST: AHI 12/h, SpO2 low 68%  03/29/2023: OV with Parrett,NP. Remains on CPAP at bedtime. 100% compliant. Benefits from CPAP with decreased daytime sleepiness.   03/27/2024: Today - follow up Discussed the use of AI scribe software for clinical note transcription with the patient, who gave verbal consent to proceed.  History of Present Illness Taylor Shaw is an 83 year old female with obstructive sleep apnea who presents for CPAP follow-up.  She has been using CPAP therapy consistently for many years and finds it effective in managing her obstructive sleep apnea with minimal issues. She sleeps well with it. She denies any excessive daytime sleepiness. No drowsy driving.   She does occasionally experience skin irritation and redness on her cheeks, sometimes leading to peeling. To mitigate this, she applies Vaseline before using the mask.  She has a [place] that keeps her active, requiring her attention due to its young and energetic nature.  02/24/2024-03/24/2024: CPAP 16 cmH2O 30/30 days; 100% >4 hr; average use 9 hr Leaks 95th 8.7 AHI 0.3    No Known Allergies  Immunization History  Administered Date(s) Administered   Fluad Quad(high Dose 65+) 02/04/2019, 05/27/2020, 05/03/2021, 02/14/2022   Fluad Trivalent(High Dose 65+) 05/28/2023   INFLUENZA, HIGH DOSE SEASONAL PF 02/19/2015, 04/03/2018   Influenza-Unspecified 03/16/2014, 02/27/2017   PFIZER(Purple Top)SARS-COV-2 Vaccination 07/09/2019, 07/30/2019   PNEUMOCOCCAL CONJUGATE-20 05/24/2022   Pfizer Covid-19  Vaccine Bivalent Booster 71yrs & up 01/07/2021   Pfizer(Comirnaty)Fall Seasonal Vaccine 12 years and older 03/17/2022   Pneumococcal Conjugate-13 07/04/2018   Pneumococcal-Unspecified 03/16/2014   Tdap 07/04/2018, 03/28/2019   Zoster Recombinant(Shingrix) 07/04/2018, 03/10/2019, 03/28/2019   Zoster, Live 04/16/2013    Past Medical History:  Diagnosis Date   Abnormal thyroid  function test 10/14/2019   Anal fissure    Arthritis    Arthritis 01/15/2015   Benign neoplasm of ascending colon    Dental abscess 06/02/2022   Diabetes (HCC) 02/19/2015   Diverticulosis 12/07/2016   Sigmoid Colon   Diverticulosis of sigmoid colon 12/08/2016   Diverticulosis of sigmoid colon 12/08/2016   Elevated blood sugar 01/15/2015   GERD (gastroesophageal reflux disease)    Hemorrhoids    History of blood transfusion    Hyperlipidemia    Hypertension 01/15/2015   Hypertension 01/15/2015   Internal hemorrhoids 04/03/2018   Leg edema 02/04/2019   Neck pain 04/03/2018   Obesity 01/15/2015   Obesity, diabetes, and hypertension syndrome (HCC) 01/14/2020   OSA on CPAP    Personal history of colonic polyps    Polyp of descending colon    Umbilical hernia without obstruction and without gangrene 10/26/2020   UTI (urinary tract infection)     Tobacco History: Social History   Tobacco Use  Smoking Status Never  Smokeless Tobacco Never  Tobacco Comments   Never    Counseling given: Not Answered Tobacco comments: Never    Outpatient Medications Prior to Visit  Medication Sig Dispense Refill   amLODipine  (NORVASC ) 10 MG tablet Take 1 tablet (10 mg total) by mouth daily. 90 tablet 3   atorvastatin  (LIPITOR)  10 MG tablet Take 1 tablet (10 mg total) by mouth daily at 6 PM. 90 tablet 3   Calcium  Carb-Cholecalciferol 600-800 MG-UNIT TABS Take 1 tablet by mouth 2 (two) times daily.     carvedilol  (COREG ) 25 MG tablet Take 1 tablet (25 mg total) by mouth 2 (two) times daily with a meal. 180 tablet 3    docusate sodium  (COLACE) 100 MG capsule Take 100 mg by mouth 2 (two) times daily.     hydrALAZINE  (APRESOLINE ) 10 MG tablet Take 1 tablet (10 mg total) by mouth in the morning and at bedtime. 180 tablet 3   levothyroxine  (SYNTHROID ) 75 MCG tablet TAKE 1 TABLET (75 MCG TOTAL) BY MOUTH DAILY BEFORE BREAKFAST. 30 MINUTES BEFORE FOOD 90 tablet 0   losartan -hydrochlorothiazide  (HYZAAR) 100-25 MG tablet Take 1 tablet by mouth daily. In am 90 tablet 3   metFORMIN  (GLUCOPHAGE ) 500 MG tablet 1 pill daily in the am with food 90 tablet 3   Omega-3 Fatty Acids (FISH OIL) 1000 MG CAPS Take 1,000 mg by mouth 2 (two) times daily.     omeprazole  (PRILOSEC) 20 MG capsule Take 1 capsule (20 mg total) by mouth daily. 30 min before food 90 capsule 3   No facility-administered medications prior to visit.     Review of Systems: as above    Physical Exam:  BP (!) 142/78   Pulse (!) 59   Ht 5' 4 (1.626 m)   Wt 228 lb (103.4 kg)   SpO2 97%   BMI 39.14 kg/m   GEN: Pleasant, interactive, well-appearing; obese; in no acute distress HEENT:  Normocephalic and atraumatic. PERRLA. Sclera white. Nasal turbinates pink, moist and patent bilaterally. No rhinorrhea present. Oropharynx pink and moist, without exudate or edema. No lesions, ulcerations, or postnasal drip. Mallampati III NECK:  Supple w/ fair ROM.No lymphadenopathy.   CV: RRR, no m/r/g PULMONARY:  Unlabored, regular breathing. Clear bilaterally A&P w/o wheezes/rales/rhonchi. No accessory muscle use.  GI: BS present and normoactive. Soft, non-tender to palpation.  Neuro: A/Ox3. No focal deficits noted.   Skin: Warm, no lesions or rashe Psych: Normal affect and behavior. Judgement and thought content appropriate.     Lab Results:  CBC    Component Value Date/Time   WBC 7.0 05/28/2023 1158   RBC 4.33 05/28/2023 1158   HGB 11.7 (L) 05/28/2023 1158   HCT 36.1 05/28/2023 1158   PLT 219.0 05/28/2023 1158   MCV 83.2 05/28/2023 1158   MCH 25.4 (L)  03/25/2018 1353   MCHC 32.5 05/28/2023 1158   RDW 14.6 05/28/2023 1158   LYMPHSABS 2.5 05/28/2023 1158   MONOABS 0.5 05/28/2023 1158   EOSABS 0.1 05/28/2023 1158   BASOSABS 0.1 05/28/2023 1158    BMET    Component Value Date/Time   NA 142 11/13/2023 1143   K 3.8 11/13/2023 1143   CL 102 11/13/2023 1143   CO2 25 11/13/2023 1143   GLUCOSE 94 11/13/2023 1143   GLUCOSE 97 05/28/2023 1158   BUN 14 11/13/2023 1143   CREATININE 0.79 11/13/2023 1143   CALCIUM  9.1 11/13/2023 1143   GFRNONAA >60 03/25/2018 1353   GFRAA >60 03/25/2018 1353    BNP No results found for: BNP   Imaging:  No results found.  Administration History     None           No data to display          No results found for: NITRICOXIDE      Assessment &  Plan:   OSA on CPAP OSA on CPAP. Excellent compliance and control. Receives benefit from use. Aware of proper care/use of device. Advised to trial mask liners for skin protection. Otherwise, no concerns or complaints. Aware of risks of untreated OSA. Encouraged to continue utilizing nightly. Safe driving practices reviewed. Healthy weight loss encouraged.  Patient Instructions  Continue to use CPAP every night, minimum of 4-6 hours a night.  Change equipment as directed. Wash your tubing with warm soap and water  daily, hang to dry. Wash humidifier portion weekly. Use bottled, distilled water  and change daily Be aware of reduced alertness and do not drive or operate heavy machinery if experiencing this or drowsiness.  Exercise encouraged, as tolerated. Healthy weight management discussed.  Avoid or decrease alcohol consumption and medications that make you more sleepy, if possible. Notify if persistent daytime sleepiness occurs even with consistent use of PAP therapy.  Change CPAP supplies... Every month Mask cushions and/or nasal pillows CPAP machine filters Every 3 months Mask frame (not including the headgear) CPAP tubing Every 6  months Mask headgear Chin strap (if applicable) Humidifier water  tub  Try mask liner for your hybrid full face CPAP mask. You can get these online at places like [employer]  Follow up in one year with Izetta Brynnleigh Mcelwee,NP, or sooner, if needed    Advised if symptoms do not improve or worsen, to please contact office for sooner follow up or seek emergency care.   I spent 25 minutes of dedicated to the care of this patient on the date of this encounter to include pre-visit review of records, face-to-face time with the patient discussing conditions above, post visit ordering of testing, clinical documentation with the electronic health record, making appropriate referrals as documented, and communicating necessary findings to members of the patients care team.  Comer LULLA Rouleau, NP 03/27/2024  Pt aware and understands NP's role.

## 2024-05-09 ENCOUNTER — Ambulatory Visit: Admitting: Family

## 2024-05-09 ENCOUNTER — Encounter: Payer: Self-pay | Admitting: Family

## 2024-05-09 VITALS — BP 130/70 | HR 65 | Temp 98.4°F | Ht 64.0 in | Wt 228.8 lb

## 2024-05-09 DIAGNOSIS — I152 Hypertension secondary to endocrine disorders: Secondary | ICD-10-CM | POA: Diagnosis not present

## 2024-05-09 DIAGNOSIS — E1159 Type 2 diabetes mellitus with other circulatory complications: Secondary | ICD-10-CM | POA: Diagnosis not present

## 2024-05-09 DIAGNOSIS — R222 Localized swelling, mass and lump, trunk: Secondary | ICD-10-CM

## 2024-05-09 DIAGNOSIS — E118 Type 2 diabetes mellitus with unspecified complications: Secondary | ICD-10-CM

## 2024-05-09 DIAGNOSIS — M545 Low back pain, unspecified: Secondary | ICD-10-CM | POA: Insufficient documentation

## 2024-05-09 DIAGNOSIS — Z7984 Long term (current) use of oral hypoglycemic drugs: Secondary | ICD-10-CM | POA: Diagnosis not present

## 2024-05-09 LAB — MICROALBUMIN / CREATININE URINE RATIO
Creatinine,U: 41.9 mg/dL
Microalb Creat Ratio: 55 mg/g — ABNORMAL HIGH (ref 0.0–30.0)
Microalb, Ur: 2.3 mg/dL — ABNORMAL HIGH (ref 0.0–1.9)

## 2024-05-09 LAB — BASIC METABOLIC PANEL WITH GFR
BUN: 17 mg/dL (ref 6–23)
CO2: 33 meq/L — ABNORMAL HIGH (ref 19–32)
Calcium: 8.7 mg/dL (ref 8.4–10.5)
Chloride: 101 meq/L (ref 96–112)
Creatinine, Ser: 0.77 mg/dL (ref 0.40–1.20)
GFR: 71.96 mL/min (ref >60.00)
Glucose, Bld: 98 mg/dL (ref 70–99)
Potassium: 3.6 meq/L (ref 3.5–5.1)
Sodium: 142 meq/L (ref 135–145)

## 2024-05-09 LAB — HEMOGLOBIN A1C: Hgb A1c MFr Bld: 5.9 % (ref 4.6–6.5)

## 2024-05-09 NOTE — Progress Notes (Signed)
 Assessment & Plan:  Right-sided low back pain without sciatica, unspecified chronicity Assessment & Plan: Acute on chronic.  Suspect degenerative disc disease contributory.  Reviewed previous images from November 2024 including XR left pelvis, lumbar spine.  No radicular symptoms at this time.  Discussed risk of falls, lower extremity weakness.  Pain has improved with Tylenol  arthritis.  Advised she may continue Tylenol  arthritis and not to exceed 4000 mg in 24 hours.  Referral to physical therapy.  We jointly agreed to defer repeat imaging at this time unless pain worsens or persist.  Orders: -     Ambulatory referral to Physical Therapy  Mass of skin of abdomen -     US  Abdomen Limited; Future  Hypertension associated with diabetes [facility]) Assessment & Plan: Chronic, excellent control. Continue Losartan  hydrochlorothiazide  100-25 mg daily, carvedilol  25 mg twice daily, amlodipine  10 mg daily, hydralazine  10 mg twice daily.  Orders: -     Basic metabolic panel with GFR  Type 2 diabetes with complication (HCC) Assessment & Plan: Previously well-controlled.  Continue metformin  500 mg once daily.  Pending A1c  Orders: -     Hemoglobin A1c -     Microalbumin / creatinine urine ratio     Return precautions given.   Risks, benefits, and alternatives of the medications and treatment plan prescribed today were discussed, and patient expressed understanding.   Education regarding symptom management and diagnosis given to patient on AVS either electronically or printed.  No follow-ups on file.  Rollene Northern, FNP  Subjective:    Patient ID: Taylor Shaw, female    DOB: 21-Mar-1941, 83 y.o.   MRN: [id number]  CC: Taylor Shaw is a 83 y.o. female who presents today for follow up.   HPI: HPI Discussed the use of AI scribe software for clinical note transcription with the patient, who gave verbal consent to proceed.  History of Present Illness   Taylor Shaw is an 83  year old female who presents with back pain radiating to the groin.  She experiences pain in the middle part of her back that wraps around to the groin area. Improved. The pain began after a visit to [place]  where she engaged in a lot of walking. Pain is exacerbated when attempting to lift her right leg or sit down. She was placed in a wheelchair to assist with mobility during her visit to [place] .   Denies associated numbness or radiation down the leg, knee pain. She experiences groin pain on both sides but notes that it does not extend down the leg.   Her past medical history includes bilateral hip replacements. She manages the pain with arthritis strength Tylenol , taking it as needed, usually up to two tablets twice a day, but only when she feels pain.  She reports urinary urgency.  She also mentions a small knot near her belly button that appeared a year ago.  It is stable in size and not causing pain.    Denies leg swelling, dysuria, hematuria, numbness, urinary or fecal incontinence, fever, chills, rash, groin pain, trauma, falls.   Pain worse when picking up right leg.  She uses her upper body to lift right leg.   Using the cane No ckd  04/2023  DG lumbar Lumbar spine: Visualization of the lumbar spine limited due to underpenetration. Minimal anterolisthesis of L3 on L4 and L4 on L5. Vertebral body heights are maintained. Mild disc height loss and endplate degenerative changes at T12-L1 and L1-L2. Moderate to  severe facet degenerative changes seen throughout, greatest at L4-L5. Pelvic left  No acute abnormality of the lumbar spine or left hip. 2. Moderate to severe degenerative changes of the lumbar spine, predominately involving the facets. 3. Uncomplicated left total hip prosthesis.   Allergies: Patient has no known allergies. Current Outpatient Medications on File Prior to Visit  Medication Sig Dispense Refill   amLODipine  (NORVASC ) 10 MG tablet Take 1 tablet (10 mg total)  by mouth daily. 90 tablet 3   atorvastatin  (LIPITOR) 10 MG tablet Take 1 tablet (10 mg total) by mouth daily at 6 PM. 90 tablet 3   Calcium  Carb-Cholecalciferol 600-800 MG-UNIT TABS Take 1 tablet by mouth 2 (two) times daily.     carvedilol  (COREG ) 25 MG tablet Take 1 tablet (25 mg total) by mouth 2 (two) times daily with a meal. 180 tablet 3   docusate sodium  (COLACE) 100 MG capsule Take 100 mg by mouth 2 (two) times daily.     hydrALAZINE  (APRESOLINE ) 10 MG tablet Take 1 tablet (10 mg total) by mouth in the morning and at bedtime. 180 tablet 3   levothyroxine  (SYNTHROID ) 75 MCG tablet TAKE 1 TABLET (75 MCG TOTAL) BY MOUTH DAILY BEFORE BREAKFAST. 30 MINUTES BEFORE FOOD 90 tablet 0   losartan -hydrochlorothiazide  (HYZAAR) 100-25 MG tablet Take 1 tablet by mouth daily. In am 90 tablet 3   metFORMIN  (GLUCOPHAGE ) 500 MG tablet 1 pill daily in the am with food 90 tablet 3   Omega-3 Fatty Acids (FISH OIL) 1000 MG CAPS Take 1,000 mg by mouth 2 (two) times daily.     omeprazole  (PRILOSEC) 20 MG capsule Take 1 capsule (20 mg total) by mouth daily. 30 min before food 90 capsule 3   No current facility-administered medications on file prior to visit.    Review of Systems  Constitutional:  Negative for chills and fever.  Respiratory:  Negative for cough and shortness of breath.   Cardiovascular:  Negative for chest pain, palpitations and leg swelling.  Gastrointestinal:  Negative for constipation, nausea and vomiting.  Genitourinary:  Negative for difficulty urinating, dysuria and hematuria.  Musculoskeletal:  Positive for back pain.  Neurological:  Negative for numbness.      Objective:    BP 130/70   Pulse 65   Temp 98.4 F (36.9 C) (Oral)   Ht 5' 4 (1.626 m)   Wt 228 lb 12.8 oz (103.8 kg)   SpO2 95%   BMI 39.27 kg/m  BP Readings from Last 3 Encounters:  05/09/24 130/70  03/27/24 (!) 142/78  11/13/23 122/70   Wt Readings from Last 3 Encounters:  05/09/24 228 lb 12.8 oz (103.8 kg)   03/27/24 228 lb (103.4 kg)  12/14/23 229 lb (103.9 kg)   Lab Results  Component Value Date   HGBA1C 5.9 (A) 11/13/2023     Physical Exam Vitals reviewed.  Constitutional:      Appearance: Normal appearance. She is well-developed.  Eyes:     Conjunctiva/sclera: Conjunctivae normal.  Cardiovascular:     Rate and Rhythm: Normal rate and regular rhythm.     Pulses: Normal pulses.     Heart sounds: Normal heart sounds.  Pulmonary:     Effort: Pulmonary effort is normal.     Breath sounds: Normal breath sounds. No wheezing, rhonchi or rales.  Abdominal:     General: Bowel sounds are normal. There is no distension.     Palpations: Abdomen is soft. Abdomen is not rigid. There is no fluid wave  or mass.     Tenderness: There is no abdominal tenderness. There is no guarding or rebound.      Comments: Soft nontender 2- 3cm cyst proximal to umbilicus  Musculoskeletal:       Arms:     Lumbar back: No swelling, edema, spasms, tenderness or bony tenderness. Normal range of motion.     Comments: Not exquisite right lower back tenderness.  No rash.  No bony step-off  full range of motion with flexion, tension, lateral side bends. No bony tenderness. No pain, numbness, tingling elicited with single leg raise bilaterally.  Unable to perform FABER test due to RLE weakness; she has to passively lift right and left leg 4/5 BLE  Sensation intact  Skin:    General: Skin is warm and dry.  Neurological:     Mental Status: She is alert.     Sensory: No sensory deficit.     Deep Tendon Reflexes:     Reflex Scores:      Patellar reflexes are 2+ on the right side and 2+ on the left side. Psychiatric:        Speech: Speech normal.        Behavior: Behavior normal.        Thought Content: Thought content normal.

## 2024-05-09 NOTE — Assessment & Plan Note (Signed)
 Chronic, excellent control. Continue Losartan  hydrochlorothiazide  100-25 mg daily, carvedilol  25 mg twice daily, amlodipine  10 mg daily, hydralazine  10 mg twice daily.

## 2024-05-09 NOTE — Assessment & Plan Note (Signed)
 Previously well-controlled.  Continue metformin  500 mg once daily.  Pending A1c

## 2024-05-09 NOTE — Assessment & Plan Note (Signed)
 Acute on chronic.  Suspect degenerative disc disease contributory.  Reviewed previous images from November 2024 including XR left pelvis, lumbar spine.  No radicular symptoms at this time.  Discussed risk of falls, lower extremity weakness.  Pain has improved with Tylenol  arthritis.  Advised she may continue Tylenol  arthritis and not to exceed 4000 mg in 24 hours.  Referral to physical therapy.  We jointly agreed to defer repeat imaging at this time unless pain worsens or persist.

## 2024-05-09 NOTE — Patient Instructions (Addendum)
 I have ordered ultrasound of your abdomen to evaluate for mass  Referral back to physical therapy  Let us  know if you dont hear back within 2 weeks in regards to an appointment being scheduled.   So that you are aware, if you are [facility] MyChart user , please pay attention to your MyChart messages as you may receive a MyChart message with a phone number to call and schedule this test/appointment own your own from our referral coordinator. This is a new process so I do not want you to miss this message.  If you are not a MyChart user, you will receive a phone call.    Please start using heat and/or ice alternating, 20 minutes twice per day  You may continue Tylenol  arthritis.  As discussed, let's start by scheduling Tylenol  Arthritis which is a 650mg  tablet .   You may take 1-2 tablets every 8 hours ( scheduled) with maximum of 6 tablets per day. Most adults can safely take 4 pills total per day of Tylenol  Arthritis 650mg  tablet. Do not exceed 6 tablets a day of Tylenol  Arthritis 650mg  tablet   For example , you could take two tablets in the morning ( 8am) and then two tablets again at 4pm.   Maximum daily dose of acetaminophen  4 g per day from all sources.  If you are taking another medication which includes acetaminophen  (Tylenol ) which may be in cough and cold preparations or pain medication such as Percocet, you will need to factor that into your total daily dose to be safe.  Please let me know if any questions  A great article regarding how to safely take and dose tylenol  found below.  Title : 'Acetaminophen  safety: Be cautious but not afraid'  https://www.health.[employer].edu/pain/acetaminophen -safety-be-cautious-but-not-afraid

## 2024-05-13 ENCOUNTER — Ambulatory Visit: Payer: Self-pay | Admitting: Family

## 2024-05-14 ENCOUNTER — Ambulatory Visit: Admitting: Nurse Practitioner

## 2024-05-14 ENCOUNTER — Other Ambulatory Visit: Payer: Self-pay | Admitting: Family

## 2024-05-16 ENCOUNTER — Ambulatory Visit
Admission: RE | Admit: 2024-05-16 | Discharge: 2024-05-16 | Disposition: A | Source: Ambulatory Visit | Attending: Family

## 2024-05-16 DIAGNOSIS — R1905 Periumbilic swelling, mass or lump: Secondary | ICD-10-CM | POA: Diagnosis not present

## 2024-05-16 DIAGNOSIS — R222 Localized swelling, mass and lump, trunk: Secondary | ICD-10-CM

## 2024-05-20 ENCOUNTER — Other Ambulatory Visit: Payer: Self-pay | Admitting: Family

## 2024-05-20 DIAGNOSIS — R809 Proteinuria, unspecified: Secondary | ICD-10-CM

## 2024-06-09 ENCOUNTER — Other Ambulatory Visit: Payer: Self-pay | Admitting: Family

## 2024-06-09 DIAGNOSIS — K439 Ventral hernia without obstruction or gangrene: Secondary | ICD-10-CM

## 2024-06-25 ENCOUNTER — Telehealth: Payer: Self-pay | Admitting: Surgery

## 2024-06-25 ENCOUNTER — Encounter: Payer: Self-pay | Admitting: Surgery

## 2024-06-25 ENCOUNTER — Ambulatory Visit: Admitting: Surgery

## 2024-06-25 VITALS — BP 155/83 | HR 55 | Ht 64.0 in | Wt 222.0 lb

## 2024-06-25 DIAGNOSIS — K436 Other and unspecified ventral hernia with obstruction, without gangrene: Secondary | ICD-10-CM

## 2024-06-25 NOTE — Telephone Encounter (Signed)
 Patient has been advised of Pre-Admission date/time, and Surgery date at [facility].  Surgery Date: 07/08/24 Preadmission Testing Date: 07/04/24 (phone 8a-1p)  Patient informed of the scheduling process and surgery information given at time of office visit.   Patient has been made aware to call [phone], between 1-3:00pm the day before surgery, to find out what time to arrive for surgery.

## 2024-06-25 NOTE — Patient Instructions (Signed)
 You have requested to have a Ventral Hernia Repair. This will be done by Dr Dana Duncan at [facility]. Please see your (BLUE) Pre-care sheet for more information. Our surgery scheduler will call you to look at surgery dates and to go over surgery information.   If you are on any injectable weight loss medication, you will need to stop taking your GLP-1 injectable (weight loss) medications 8 days before your surgery to avoid any complications with anesthesia.   You will need to arrange to be out of work for approximately 1-2 weeks and then you may return with a lifting restriction for 4 more weeks. If you have FMLA or Disability paperwork that needs to be filled out, please have your company fax your paperwork to [phone] or you may drop this by either office. This paperwork will be filled out within 3 days after your surgery has been completed.     Ventral Hernia A ventral hernia (also called an incisional hernia) is a hernia that occurs at the site of a previous surgical cut (incision) in the abdomen. The abdominal wall spans from your lower chest down to your pelvis. If the abdominal wall is weakened from a surgical incision, a hernia can occur. A hernia is a bulge of bowel or muscle tissue pushing out on the weakened part of the abdominal wall. Ventral hernias can get bigger from straining or lifting. Obese and older people are at higher risk for a ventral hernia. People who develop infections after surgery or require repeat incisions at the same site on the abdomen are also at increased risk. CAUSES  A ventral hernia occurs because of weakness in the abdominal wall at an incision site.  SYMPTOMS  Common symptoms include: A visible bulge or lump on the abdominal wall. Pain or tenderness around the lump. Increased discomfort if you cough or make a sudden movement. If the hernia has blocked part of the intestine, a serious complication can occur (incarcerated or strangulated hernia). This can become a  problem that requires emergency surgery because the blood flow to the blocked intestine may be cut off. Symptoms may include: Feeling sick to your stomach (nauseous). Throwing up (vomiting). Stomach swelling (distention) or bloating. Fever. Rapid heartbeat. DIAGNOSIS  Your health care provider will take a medical history and perform a physical exam. Various tests may be ordered, such as: Blood tests. Urine tests. Ultrasonography. X-rays. Computed tomography (CT). TREATMENT  Watchful waiting may be all that is needed for a smaller hernia that does not cause symptoms. Your health care provider may recommend the use of a supportive belt (truss) that helps to keep the abdominal wall intact. For larger hernias or those that cause pain, surgery to repair the hernia is usually recommended. If a hernia becomes strangulated, emergency surgery needs to be done right away. HOME CARE INSTRUCTIONS Avoid putting pressure or strain on the abdominal area. Avoid heavy lifting. Use good body positioning for physical tasks. Ask your health care provider about proper body positioning. Use a supportive belt as directed by your health care provider. Maintain a healthy weight. Eat foods that are high in fiber, such as whole grains, fruits, and vegetables. Fiber helps prevent difficult bowel movements (constipation). Drink enough fluids to keep your urine clear or pale yellow. Follow up with your health care provider as directed. SEEK MEDICAL CARE IF:  Your hernia seems to be getting larger or more painful. SEEK IMMEDIATE MEDICAL CARE IF:  You have abdominal pain that is sudden and sharp. Your pain  becomes severe. You have repeated vomiting. You are sweating a lot. You notice a rapid heartbeat. You develop a fever. MAKE SURE YOU:  Understand these instructions. Will watch your condition. Will get help right away if you are not doing well or get worse.     Open Ventral Hernia Repair Open ventral  hernia repair is a surgery to fix a ventral hernia. A ventral hernia,  is a bulge of body tissue or intestines that pushes through the front part of the abdomen. This can happen if the connective tissue covering the muscles over the abdomen has a weak spot or is torn because of a surgical cut (incision) from a previous surgery. A ventral hernia repair is often done soon after diagnosis to stop the hernia from getting bigger, becoming uncomfortable, or becoming an emergency. This surgery usually takes about 2 hours, but the time can vary greatly.  LET [facility] CARE PROVIDER KNOW ABOUT: Any allergies you have. All medicines you are taking, including steroids, vitamins, herbs, eye drops, creams, and over-the-counter medicines. Previous problems you or members of your family have had with the use of anesthetics. Any blood disorders you have. Previous surgeries you have had. Medical conditions you have.  RISKS AND COMPLICATIONS  Generally, Open ventral hernia repair is a safe procedure. However, as with any surgical procedure, problems can occur. Possible problems include: Bleeding. Trouble passing urine or having a bowel movement after the surgery. Infection. Pneumonia. Blood clots. Pain in the area of the hernia. A bulge in the area of the hernia that may be caused by a collection of fluid. Injury to intestines or other structures in the abdomen. Return of the hernia after surgery.  BEFORE THE PROCEDURE  You may need to have blood tests, urine tests, a chest X-ray, or an electrocardiogram done before the day of the surgery. Ask your health care provider about changing or stopping your regular medicines. This is especially important if you are taking diabetes medicines or blood thinners. You may need to wash with a special type of germ-killing soap. Do not eat or drink anything after midnight the night before the procedure or as directed by your health care provider. Make plans to have  someone drive you home after the procedure.  PROCEDURE  Small monitors will be put on your body. They are used to check your heart, blood pressure, and oxygen  level. An IV access tube will be put into a vein in your hand or arm. Fluids and medicine will flow directly into your body through the IV tube. You will be given medicine that makes you go to sleep (general anesthetic). Your abdomen will be cleaned with a special soap to kill any germs on your skin. Once you are asleep, a moderate - large size incision will be made in your abdomen. The size of incision depends on how large your hernia is. Your surgeon puts the tissue or intestines that formed the hernia back in place. A screen-like patch (mesh) is used to close the hernia. This helps make the area stronger. Stitches, tacks, or staples are used to keep the mesh in place. Medicine and a bandage (dressing) or skin glue will be put over the incision.  AFTER THE PROCEDURE  You will stay in a recovery area until the anesthetic wears off. Your blood pressure and pulse will be checked often. You may be able to go home the same day or may need to stay in the hospital for 1-2 days after surgery. Your  surgeon will decide when you can go home depending upon your recovery. You may feel some pain. You will be given medicine for pain. You will be urged to do breathing exercises that involve taking deep breaths. This helps prevent a lung infection after a surgery. You may have to wear compression stockings while you are in the hospital. These stockings help keep blood clots from forming in your legs.   This information is not intended to replace advice given to you by your health care provider. Make sure you discuss any questions you have with your health care provider.   Document Released: 05/08/2012 Document Revised: 05/27/2013 Document Reviewed: 05/08/2012 Elsevier Interactive Patient Education [employer]

## 2024-06-26 ENCOUNTER — Ambulatory Visit: Admitting: Podiatry

## 2024-06-26 DIAGNOSIS — Z91199 Patient's noncompliance with other medical treatment and regimen due to unspecified reason: Secondary | ICD-10-CM

## 2024-06-26 NOTE — Progress Notes (Signed)
 1. No-show for appointment

## 2024-06-27 NOTE — H&P (View-Only) (Signed)
 Outpatient Surgical Follow Up    Taylor Shaw is an 84 y.o. female.   Chief Complaint  Patient presents with   New Patient (Initial Visit)    Hernia    HPI: Taylor Shaw is an 84 year old female seen in consultation at the request of Ms. Arnett FNP.  She does have a history of hypertension diabetes.  She states that she has this hernia for a while but few weeks ago it started having symptoms.  She reports that she did have an episode where she had moderate pain and this lasted for few hours and then subsided.  She states that the pain is intermittent sharp and worsening with Valsalva.  She did have prior history of tubal ligation.  I also saw that we were able to do a chemical sphincterotomy more than 5 to 6 years ago.  She comes accompanied by her daughter.  She denies any fevers any chills she is tolerating p.o.  No vomiting.  She did have a CT scan that I personally reviewed showing a chronically incarcerated ventral hernia around 3 to 4 cm.  No other acute intra-abdominal abnormalities. She does pretty much everything.  Her mind is pristine. Her diabetes is well-controlled and her BMP is normal.  Past Medical History:  Diagnosis Date   Abnormal thyroid  function test 10/14/2019   Anal fissure    Arthritis    Arthritis 01/15/2015   Benign neoplasm of ascending colon    Dental abscess 06/02/2022   Diabetes (HCC) 02/19/2015   Diverticulosis 12/07/2016   Sigmoid Colon   Diverticulosis of sigmoid colon 12/08/2016   Diverticulosis of sigmoid colon 12/08/2016   Elevated blood sugar 01/15/2015   GERD (gastroesophageal reflux disease)    Hemorrhoids    History of blood transfusion    Hyperlipidemia    Hypertension 01/15/2015   Hypertension 01/15/2015   Internal hemorrhoids 04/03/2018   Leg edema 02/04/2019   Neck pain 04/03/2018   Obesity 01/15/2015   Obesity, diabetes, and hypertension syndrome (HCC) 01/14/2020   OSA on CPAP    Personal history of colonic polyps    Polyp of  descending colon    Umbilical hernia without obstruction and without gangrene 10/26/2020   UTI (urinary tract infection)     Past Surgical History:  Procedure Laterality Date   BOTOX  INJECTION N/A 03/27/2018   Procedure: BOTOX  INJECTION;  Surgeon: Jordis Laneta FALCON, MD;  Location: [facility];  Service: General;  Laterality: N/A;   BREAST BIOPSY Right 09/26/2022   rt br stereo coil clip path pending   BREAST BIOPSY Right 09/26/2022   MM RT BREAST BX W LOC DEV 1ST LESION IMAGE BX SPEC STEREO GUIDE 09/26/2022 [facility]   CATARACT EXTRACTION W/PHACO Left 05/31/2020   Procedure: CATARACT EXTRACTION PHACO AND INTRAOCULAR LENS PLACEMENT (IOC) LEFT;  Surgeon: Myrna Adine Anes, MD;  Location: [facility];  Service: Ophthalmology;  Laterality: Left;  4.16 0:40.4   CATARACT EXTRACTION W/PHACO Right 07/05/2020   Procedure: CATARACT EXTRACTION PHACO AND INTRAOCULAR LENS PLACEMENT (IOC) RIGHT DIABETIC 3.20 00:26.3;  Surgeon: Myrna Adine Anes, MD;  Location: [facility];  Service: Ophthalmology;  Laterality: Right;  Diabetic - oral meds   COLONOSCOPY     COLONOSCOPY WITH PROPOFOL  N/A 12/07/2016   Procedure: COLONOSCOPY WITH PROPOFOL ;  Surgeon: Jinny Carmine, MD;  Location: [facility];  Service: Endoscopy;  Laterality: N/A;  Diabetic - oral meds   COLONOSCOPY WITH PROPOFOL  N/A 03/21/2022   Procedure: COLONOSCOPY WITH PROPOFOL ;  Surgeon: Jinny Carmine, MD;  Location: [facility];  Service: Endoscopy;  Laterality: N/A;   EVALUATION UNDER ANESTHESIA WITH ANAL FISSUROTOMY N/A 03/27/2018   Procedure: EXAM UNDER ANESTHESIA, CHEMICAL SPHINCTEROTOMY;  Surgeon: Jordis Laneta FALCON, MD;  Location: [facility];  Service: General;  Laterality: N/A;   HEMORRHOID SURGERY N/A 06/13/2018   Procedure: HEMORRHOIDECTOMY;  Surgeon: Jordis Laneta FALCON, MD;  Location: [facility];  Service: General;  Laterality: N/A;   JOINT REPLACEMENT     b/l hips in [place] 2004 and 2005 Dr. Tess    POLYPECTOMY   12/07/2016   Procedure: POLYPECTOMY;  Surgeon: Jinny Carmine, MD;  Location: [facility];  Service: Endoscopy;;   RECTAL EXAM UNDER ANESTHESIA N/A 06/13/2018   Procedure: RECTAL EXAM UNDER ANESTHESIA;  Surgeon: Jordis Laneta FALCON, MD;  Location: [facility];  Service: General;  Laterality: N/A;   TOTAL HIP ARTHROPLASTY Bilateral    TUBAL LIGATION      Family History  Problem Relation Age of Onset   Dementia Mother    Hypertension Mother    Arthritis Mother    Alcohol abuse Father    Cirrhosis Father    Cancer Daughter        breast   COPD Brother    Breast cancer Sister 6   Cancer Sister        ? type    Dementia Sister    Cancer Brother        ?type   Cancer Brother        cancer ?type   Cancer Daughter        cancer ?type died 30    Alzheimer's disease Sister     Social History:  reports that she has never smoked. She has never been exposed to tobacco smoke. She has never used smokeless tobacco. She reports that she does not drink alcohol and does not use drugs.  Allergies: Allergies[1]  Medications reviewed.    ROS Full ROS performed and is otherwise negative other than what is stated in HPI   BP (!) 155/83   Pulse (!) 55   Ht 5' 4 (1.626 m)   Wt 222 lb (100.7 kg)   SpO2 98%   BMI 38.11 kg/m   Physical Exam Vitals and nursing note reviewed. Exam conducted with a chaperone present.  Constitutional:      Appearance: Normal appearance. She is obese. She is not ill-appearing.  Cardiovascular:     Rate and Rhythm: Normal rate and regular rhythm.  Pulmonary:     Effort: Pulmonary effort is normal. No respiratory distress.     Breath sounds: Normal breath sounds. No stridor. No wheezing or rhonchi.  Abdominal:     General: Abdomen is flat. There is no distension.     Palpations: Abdomen is soft. There is no mass.     Tenderness: There is no guarding or rebound.     Hernia: A hernia is present.     Comments: Ventral hernia 4 cms mildly tender to  palpation, chronically incarcerated, no rebound.  Musculoskeletal:        General: No deformity. Normal range of motion.     Cervical back: Normal range of motion and neck supple. No rigidity or tenderness.  Skin:    General: Skin is warm and dry.     Capillary Refill: Capillary refill takes less than 2 seconds.     Coloration: Skin is not jaundiced.  Neurological:     General: No focal deficit present.     Mental Status: She  is alert and oriented to person, place, and time.     Sensory: No sensory deficit.  Psychiatric:        Mood and Affect: Mood normal.        Behavior: Behavior normal.        Thought Content: Thought content normal.        Judgment: Judgment normal.        Assessment/Plan: 84 year old female with a symptomatic chronically incarcerated ventral hernia.  Discussed with the patient in detail about her disease process.  She does have intermittent symptoms.  Given the sizable defect I do think that repair is indicated.  I had an extensive discussion with the patient and also her granddaughter regarding her disease process.  I do think that we will be able to perform this robotically.  I am not sure if we have to lock story of waiting for her optimization of the BMI.  I do think that the harm of waiting will be greater than a potential risk of perioperative repair.  Procedure discussed with them in detail.  The risk the benefits and the possible complications including but not limited to: Bleeding, infection, bowel injuries, recurrences.  They do understand and wish to proceed. I personally spent a total of 60 minutes in the care of the patient today including performing a medically appropriate exam/evaluation, counseling and educating, placing orders, referring and communicating with other health care professionals, documenting clinical information in the EHR, independently interpreting and reviewing images studies and coordinating care.   Laneta Luna, MD FACS General  Surgeon    [1] No Known Allergies

## 2024-06-27 NOTE — Progress Notes (Signed)
 Outpatient Surgical Follow Up    Taylor Shaw is an 84 y.o. female.   Chief Complaint  Patient presents with   New Patient (Initial Visit)    Hernia    HPI: Taylor Shaw is an 84 year old female seen in consultation at the request of Ms. Arnett FNP.  She does have a history of hypertension diabetes.  She states that she has this hernia for a while but few weeks ago it started having symptoms.  She reports that she did have an episode where she had moderate pain and this lasted for few hours and then subsided.  She states that the pain is intermittent sharp and worsening with Valsalva.  She did have prior history of tubal ligation.  I also saw that we were able to do a chemical sphincterotomy more than 5 to 6 years ago.  She comes accompanied by her daughter.  She denies any fevers any chills she is tolerating p.o.  No vomiting.  She did have a CT scan that I personally reviewed showing a chronically incarcerated ventral hernia around 3 to 4 cm.  No other acute intra-abdominal abnormalities. She does pretty much everything.  Her mind is pristine. Her diabetes is well-controlled and her BMP is normal.  Past Medical History:  Diagnosis Date   Abnormal thyroid  function test 10/14/2019   Anal fissure    Arthritis    Arthritis 01/15/2015   Benign neoplasm of ascending colon    Dental abscess 06/02/2022   Diabetes (HCC) 02/19/2015   Diverticulosis 12/07/2016   Sigmoid Colon   Diverticulosis of sigmoid colon 12/08/2016   Diverticulosis of sigmoid colon 12/08/2016   Elevated blood sugar 01/15/2015   GERD (gastroesophageal reflux disease)    Hemorrhoids    History of blood transfusion    Hyperlipidemia    Hypertension 01/15/2015   Hypertension 01/15/2015   Internal hemorrhoids 04/03/2018   Leg edema 02/04/2019   Neck pain 04/03/2018   Obesity 01/15/2015   Obesity, diabetes, and hypertension syndrome (HCC) 01/14/2020   OSA on CPAP    Personal history of colonic polyps    Polyp of  descending colon    Umbilical hernia without obstruction and without gangrene 10/26/2020   UTI (urinary tract infection)     Past Surgical History:  Procedure Laterality Date   BOTOX  INJECTION N/A 03/27/2018   Procedure: BOTOX  INJECTION;  Surgeon: Jordis Laneta FALCON, MD;  Location: [facility];  Service: General;  Laterality: N/A;   BREAST BIOPSY Right 09/26/2022   rt br stereo coil clip path pending   BREAST BIOPSY Right 09/26/2022   MM RT BREAST BX W LOC DEV 1ST LESION IMAGE BX SPEC STEREO GUIDE 09/26/2022 [facility]   CATARACT EXTRACTION W/PHACO Left 05/31/2020   Procedure: CATARACT EXTRACTION PHACO AND INTRAOCULAR LENS PLACEMENT (IOC) LEFT;  Surgeon: Myrna Adine Anes, MD;  Location: [facility];  Service: Ophthalmology;  Laterality: Left;  4.16 0:40.4   CATARACT EXTRACTION W/PHACO Right 07/05/2020   Procedure: CATARACT EXTRACTION PHACO AND INTRAOCULAR LENS PLACEMENT (IOC) RIGHT DIABETIC 3.20 00:26.3;  Surgeon: Myrna Adine Anes, MD;  Location: [facility];  Service: Ophthalmology;  Laterality: Right;  Diabetic - oral meds   COLONOSCOPY     COLONOSCOPY WITH PROPOFOL  N/A 12/07/2016   Procedure: COLONOSCOPY WITH PROPOFOL ;  Surgeon: Jinny Carmine, MD;  Location: [facility];  Service: Endoscopy;  Laterality: N/A;  Diabetic - oral meds   COLONOSCOPY WITH PROPOFOL  N/A 03/21/2022   Procedure: COLONOSCOPY WITH PROPOFOL ;  Surgeon: Jinny Carmine, MD;  Location: [facility];  Service: Endoscopy;  Laterality: N/A;   EVALUATION UNDER ANESTHESIA WITH ANAL FISSUROTOMY N/A 03/27/2018   Procedure: EXAM UNDER ANESTHESIA, CHEMICAL SPHINCTEROTOMY;  Surgeon: Jordis Laneta FALCON, MD;  Location: [facility];  Service: General;  Laterality: N/A;   HEMORRHOID SURGERY N/A 06/13/2018   Procedure: HEMORRHOIDECTOMY;  Surgeon: Jordis Laneta FALCON, MD;  Location: [facility];  Service: General;  Laterality: N/A;   JOINT REPLACEMENT     b/l hips in [place] 2004 and 2005 Dr. Tess    POLYPECTOMY   12/07/2016   Procedure: POLYPECTOMY;  Surgeon: Jinny Carmine, MD;  Location: [facility];  Service: Endoscopy;;   RECTAL EXAM UNDER ANESTHESIA N/A 06/13/2018   Procedure: RECTAL EXAM UNDER ANESTHESIA;  Surgeon: Jordis Laneta FALCON, MD;  Location: [facility];  Service: General;  Laterality: N/A;   TOTAL HIP ARTHROPLASTY Bilateral    TUBAL LIGATION      Family History  Problem Relation Age of Onset   Dementia Mother    Hypertension Mother    Arthritis Mother    Alcohol abuse Father    Cirrhosis Father    Cancer Daughter        breast   COPD Brother    Breast cancer Sister 6   Cancer Sister        ? type    Dementia Sister    Cancer Brother        ?type   Cancer Brother        cancer ?type   Cancer Daughter        cancer ?type died 30    Alzheimer's disease Sister     Social History:  reports that she has never smoked. She has never been exposed to tobacco smoke. She has never used smokeless tobacco. She reports that she does not drink alcohol and does not use drugs.  Allergies: Allergies[1]  Medications reviewed.    ROS Full ROS performed and is otherwise negative other than what is stated in HPI   BP (!) 155/83   Pulse (!) 55   Ht 5' 4 (1.626 m)   Wt 222 lb (100.7 kg)   SpO2 98%   BMI 38.11 kg/m   Physical Exam Vitals and nursing note reviewed. Exam conducted with a chaperone present.  Constitutional:      Appearance: Normal appearance. She is obese. She is not ill-appearing.  Cardiovascular:     Rate and Rhythm: Normal rate and regular rhythm.  Pulmonary:     Effort: Pulmonary effort is normal. No respiratory distress.     Breath sounds: Normal breath sounds. No stridor. No wheezing or rhonchi.  Abdominal:     General: Abdomen is flat. There is no distension.     Palpations: Abdomen is soft. There is no mass.     Tenderness: There is no guarding or rebound.     Hernia: A hernia is present.     Comments: Ventral hernia 4 cms mildly tender to  palpation, chronically incarcerated, no rebound.  Musculoskeletal:        General: No deformity. Normal range of motion.     Cervical back: Normal range of motion and neck supple. No rigidity or tenderness.  Skin:    General: Skin is warm and dry.     Capillary Refill: Capillary refill takes less than 2 seconds.     Coloration: Skin is not jaundiced.  Neurological:     General: No focal deficit present.     Mental Status: She  is alert and oriented to person, place, and time.     Sensory: No sensory deficit.  Psychiatric:        Mood and Affect: Mood normal.        Behavior: Behavior normal.        Thought Content: Thought content normal.        Judgment: Judgment normal.        Assessment/Plan: 84 year old female with a symptomatic chronically incarcerated ventral hernia.  Discussed with the patient in detail about her disease process.  She does have intermittent symptoms.  Given the sizable defect I do think that repair is indicated.  I had an extensive discussion with the patient and also her granddaughter regarding her disease process.  I do think that we will be able to perform this robotically.  I am not sure if we have to lock story of waiting for her optimization of the BMI.  I do think that the harm of waiting will be greater than a potential risk of perioperative repair.  Procedure discussed with them in detail.  The risk the benefits and the possible complications including but not limited to: Bleeding, infection, bowel injuries, recurrences.  They do understand and wish to proceed. I personally spent a total of 60 minutes in the care of the patient today including performing a medically appropriate exam/evaluation, counseling and educating, placing orders, referring and communicating with other health care professionals, documenting clinical information in the EHR, independently interpreting and reviewing images studies and coordinating care.   Laneta Luna, MD FACS General  Surgeon    [1] No Known Allergies

## 2024-07-03 ENCOUNTER — Encounter
Admission: RE | Admit: 2024-07-03 | Discharge: 2024-07-03 | Disposition: A | Discharge status: Home / Self Care | Source: Ambulatory Visit | Attending: Surgery | Admitting: Surgery

## 2024-07-03 ENCOUNTER — Other Ambulatory Visit: Payer: Self-pay

## 2024-07-03 DIAGNOSIS — E118 Type 2 diabetes mellitus with unspecified complications: Secondary | ICD-10-CM

## 2024-07-03 DIAGNOSIS — Z01818 Encounter for other preprocedural examination: Secondary | ICD-10-CM

## 2024-07-03 DIAGNOSIS — Z79899 Other long term (current) drug therapy: Secondary | ICD-10-CM

## 2024-07-03 DIAGNOSIS — I152 Hypertension secondary to endocrine disorders: Secondary | ICD-10-CM

## 2024-07-03 DIAGNOSIS — Z01810 Encounter for preprocedural cardiovascular examination: Secondary | ICD-10-CM

## 2024-07-03 DIAGNOSIS — Z01812 Encounter for preprocedural laboratory examination: Secondary | ICD-10-CM

## 2024-07-03 DIAGNOSIS — K439 Ventral hernia without obstruction or gangrene: Secondary | ICD-10-CM

## 2024-07-03 HISTORY — DX: Ventral hernia without obstruction or gangrene: K43.9

## 2024-07-03 HISTORY — DX: Hypothyroidism, unspecified: E03.9

## 2024-07-03 HISTORY — DX: Type 2 diabetes mellitus without complications: E11.9

## 2024-07-03 MED ORDER — SODIUM CHLORIDE 0.9 % IV SOLN: INTRAVENOUS | Status: DC

## 2024-07-03 MED ORDER — ORAL CARE MOUTH RINSE: 15.0000 mL | Freq: Once | OROMUCOSAL | Status: AC

## 2024-07-03 MED ORDER — CHLORHEXIDINE GLUCONATE CLOTH 2 % EX PADS
6.0000 | MEDICATED_PAD | Freq: Once | CUTANEOUS | Status: AC
  Administered 2024-07-04: 6 via TOPICAL

## 2024-07-03 MED ORDER — CHLORHEXIDINE GLUCONATE 0.12 % MT SOLN
15.0000 mL | Freq: Once | OROMUCOSAL | Status: AC
  Administered 2024-07-04: 15 mL via OROMUCOSAL

## 2024-07-03 MED ORDER — CEFAZOLIN SODIUM-DEXTROSE 2-4 GM/100ML-% IV SOLN
2.0000 g | INTRAVENOUS | Status: AC
  Administered 2024-07-04: 2 g via INTRAVENOUS

## 2024-07-03 MED ORDER — ACETAMINOPHEN 500 MG PO TABS
1000.0000 mg | ORAL_TABLET | ORAL | Status: AC
  Administered 2024-07-04: 1000 mg via ORAL

## 2024-07-03 MED ORDER — CHLORHEXIDINE GLUCONATE CLOTH 2 % EX PADS: 6.0000 | MEDICATED_PAD | Freq: Once | CUTANEOUS | Status: DC

## 2024-07-03 MED ORDER — GABAPENTIN 300 MG PO CAPS
300.0000 mg | ORAL_CAPSULE | ORAL | Status: AC
  Administered 2024-07-04: 300 mg via ORAL

## 2024-07-03 NOTE — Patient Instructions (Signed)
 Your procedure is scheduled on:07-04-24 Friday Report to the Registration Desk on the 1st floor of the [facility].Then proceed to the 2nd floor Surgery Desk To find out your arrival time, please call [phone] between 1PM - 3PM on:07-03-24 Thursday If your arrival time is 6:00 am, do not arrive before that time as the [facility] entrance doors do not open until 6:00 am.  REMEMBER: Instructions that are not followed completely may result in serious medical risk, up to and including death; or upon the discretion of your surgeon and anesthesiologist your surgery may need to be rescheduled.  Do not eat food after midnight the night before surgery.  No gum chewing or hard candies.  You may however, drink Water  up to 2 hours before you are scheduled to arrive for your surgery. Do not drink anything within 2 hours of your scheduled arrival time.  One week prior to surgery: Stop Anti-inflammatories (NSAIDS) such as Advil, Aleve, Ibuprofen, Motrin, Naproxen, Naprosyn and Aspirin based products such as Excedrin, Goody's Powder, BC Powder. Stop ANY OVER THE COUNTER supplements until after surgery.  You may however, continue to take Tylenol  if needed for pain up until the day of surgery.  Continue taking all of your other prescription medications up until the day of surgery.  ON THE DAY OF SURGERY ONLY TAKE THESE MEDICATIONS WITH SIPS OF WATER : -amLODipine  (NORVASC )  -carvedilol  (COREG )  -hydrALAZINE  (APRESOLINE )  -levothyroxine  (SYNTHROID )  -omeprazole  (PRILOSEC)   No Alcohol for 24 hours before or after surgery.  No Smoking including e-cigarettes for 24 hours before surgery.  No chewable tobacco products for at least 6 hours before surgery.  No nicotine patches on the day of surgery.  Do not use any recreational drugs for at least a week (preferably 2 weeks) before your surgery.  Please be advised that the combination of cocaine and anesthesia may have negative outcomes, up to and  including death. If you test positive for cocaine, your surgery will be cancelled.  On the morning of surgery brush your teeth with toothpaste and water , you may rinse your mouth with mouthwash if you wish. Do not swallow any toothpaste or mouthwash.  Do not wear jewelry, make-up, hairpins, clips or nail polish.  For welded (permanent) jewelry: bracelets, anklets, waist bands, etc.  Please have this removed prior to surgery.  If it is not removed, there is a chance that hospital personnel will need to cut it off on the day of surgery.  Do not wear lotions, powders, or perfumes.   Do not shave body hair from the neck down 48 hours before surgery.  Contact lenses, hearing aids and dentures may not be worn into surgery.  Do not bring valuables to the hospital. [facility] is not responsible for any missing/lost belongings or valuables.   Bring your C-PAP to the hospital  Notify your doctor if there is any change in your medical condition (cold, fever, infection).  Wear comfortable clothing (specific to your surgery type) to the hospital.  After surgery, you can help prevent lung complications by doing breathing exercises.  Take deep breaths and cough every 1-2 hours. Your doctor may order a device called an Incentive Spirometer to help you take deep breaths. When coughing or sneezing, hold a pillow firmly against your incision with both hands. This is called splinting. Doing this helps protect your incision. It also decreases belly discomfort.  If you are being admitted to the hospital overnight, leave your suitcase in the car. After surgery  it may be brought to your room.  In case of increased patient census, it may be necessary for you, the patient, to continue your postoperative care in the Same Day Surgery department.  If you are being discharged the day of surgery, you will not be allowed to drive home. You will need a responsible individual to drive you home and stay with you  for 24 hours after surgery.   If you are taking public transportation, you will need to have a responsible individual with you.  Please call the Pre-admissions Testing Dept. at [phone] if you have any questions about these instructions.  Surgery Visitation Policy:  Patients having surgery or a procedure may have two visitors.  Children under the age of 60 must have an adult with them who is not the patient.   Merchandiser, Retail to address health-related social needs:  [email or web address]

## 2024-07-04 ENCOUNTER — Ambulatory Visit
Admission: RE | Admit: 2024-07-04 | Discharge: 2024-07-04 | Disposition: A | Discharge status: Home / Self Care | Attending: Surgery | Admitting: Surgery

## 2024-07-04 ENCOUNTER — Ambulatory Visit: Payer: Self-pay | Admitting: Anesthesiology

## 2024-07-04 ENCOUNTER — Other Ambulatory Visit: Payer: Self-pay

## 2024-07-04 ENCOUNTER — Inpatient Hospital Stay: Admission: RE | Admit: 2024-07-04 | Source: Ambulatory Visit

## 2024-07-04 ENCOUNTER — Ambulatory Visit: Payer: Self-pay | Admitting: Urgent Care

## 2024-07-04 ENCOUNTER — Encounter
Admission: RE | Disposition: A | Discharge status: Home / Self Care | Payer: Self-pay | Source: Home / Self Care | Attending: Surgery

## 2024-07-04 ENCOUNTER — Encounter: Payer: Self-pay | Admitting: Surgery

## 2024-07-04 DIAGNOSIS — I10 Essential (primary) hypertension: Secondary | ICD-10-CM | POA: Insufficient documentation

## 2024-07-04 DIAGNOSIS — I2510 Atherosclerotic heart disease of native coronary artery without angina pectoris: Secondary | ICD-10-CM | POA: Insufficient documentation

## 2024-07-04 DIAGNOSIS — I152 Hypertension secondary to endocrine disorders: Secondary | ICD-10-CM

## 2024-07-04 DIAGNOSIS — K436 Other and unspecified ventral hernia with obstruction, without gangrene: Secondary | ICD-10-CM

## 2024-07-04 DIAGNOSIS — Z01818 Encounter for other preprocedural examination: Secondary | ICD-10-CM

## 2024-07-04 DIAGNOSIS — K219 Gastro-esophageal reflux disease without esophagitis: Secondary | ICD-10-CM | POA: Insufficient documentation

## 2024-07-04 DIAGNOSIS — E119 Type 2 diabetes mellitus without complications: Secondary | ICD-10-CM | POA: Diagnosis not present

## 2024-07-04 DIAGNOSIS — M1990 Unspecified osteoarthritis, unspecified site: Secondary | ICD-10-CM | POA: Insufficient documentation

## 2024-07-04 DIAGNOSIS — Z79899 Other long term (current) drug therapy: Secondary | ICD-10-CM

## 2024-07-04 DIAGNOSIS — Z01812 Encounter for preprocedural laboratory examination: Secondary | ICD-10-CM

## 2024-07-04 DIAGNOSIS — Z6838 Body mass index (BMI) 38.0-38.9, adult: Secondary | ICD-10-CM | POA: Diagnosis not present

## 2024-07-04 DIAGNOSIS — E669 Obesity, unspecified: Secondary | ICD-10-CM | POA: Diagnosis not present

## 2024-07-04 DIAGNOSIS — Z01810 Encounter for preprocedural cardiovascular examination: Secondary | ICD-10-CM

## 2024-07-04 DIAGNOSIS — E118 Type 2 diabetes mellitus with unspecified complications: Secondary | ICD-10-CM

## 2024-07-04 LAB — GLUCOSE, CAPILLARY: Glucose-Capillary: 144 mg/dL — ABNORMAL HIGH (ref 70–99)

## 2024-07-04 LAB — CBC
HCT: 39 % (ref 36.0–46.0)
Hemoglobin: 12.2 g/dL (ref 12.0–15.0)
MCH: 26.2 pg (ref 26.0–34.0)
MCHC: 31.3 g/dL (ref 30.0–36.0)
MCV: 83.7 fL (ref 80.0–100.0)
Platelets: 288 10*3/uL (ref 150–400)
RBC: 4.66 MIL/uL (ref 3.87–5.11)
RDW: 13.5 % (ref 11.5–15.5)
WBC: 7.1 10*3/uL (ref 4.0–10.5)
nRBC: 0 % (ref 0.0–0.2)

## 2024-07-04 LAB — BASIC METABOLIC PANEL WITH GFR
Anion gap: 8 (ref 5–15)
BUN: 17 mg/dL (ref 8–23)
CO2: 29 mmol/L (ref 22–32)
Calcium: 8.8 mg/dL — ABNORMAL LOW (ref 8.9–10.3)
Chloride: 101 mmol/L (ref 98–111)
Creatinine, Ser: 0.93 mg/dL (ref 0.44–1.00)
GFR, Estimated: >60 mL/min
Glucose, Bld: 128 mg/dL — ABNORMAL HIGH (ref 70–99)
Potassium: 3.7 mmol/L (ref 3.5–5.1)
Sodium: 138 mmol/L (ref 135–145)

## 2024-07-04 MED ORDER — ROCURONIUM BROMIDE 10 MG/ML (PF) SYRINGE
PREFILLED_SYRINGE | INTRAVENOUS | Status: AC
  Filled 2024-07-04: qty 10

## 2024-07-04 MED ORDER — KETOROLAC TROMETHAMINE 30 MG/ML IJ SOLN
INTRAMUSCULAR | Status: AC
  Filled 2024-07-04: qty 1

## 2024-07-04 MED ORDER — ACETAMINOPHEN 10 MG/ML IV SOLN: 1000.0000 mg | Freq: Once | INTRAVENOUS | Status: DC | PRN

## 2024-07-04 MED ORDER — PROPOFOL 10 MG/ML IV BOLUS
INTRAVENOUS | Status: DC | PRN
  Administered 2024-07-04: 150 mg via INTRAVENOUS

## 2024-07-04 MED ORDER — OXYCODONE HCL 5 MG/5ML PO SOLN
ORAL | Status: AC
  Filled 2024-07-04: qty 5

## 2024-07-04 MED ORDER — GABAPENTIN 300 MG PO CAPS
ORAL_CAPSULE | ORAL | Status: AC
  Filled 2024-07-04: qty 1

## 2024-07-04 MED ORDER — ACETAMINOPHEN 500 MG PO TABS
ORAL_TABLET | ORAL | Status: AC
  Filled 2024-07-04: qty 2

## 2024-07-04 MED ORDER — SUGAMMADEX SODIUM 200 MG/2ML IV SOLN
INTRAVENOUS | Status: DC | PRN
  Administered 2024-07-04: 200 mg via INTRAVENOUS

## 2024-07-04 MED ORDER — ONDANSETRON HCL 4 MG/2ML IJ SOLN: 4.0000 mg | Freq: Once | INTRAMUSCULAR | Status: DC | PRN

## 2024-07-04 MED ORDER — OXYCODONE HCL 5 MG/5ML PO SOLN
5.0000 mg | Freq: Once | ORAL | Status: AC | PRN
  Administered 2024-07-04: 5 mg via ORAL

## 2024-07-04 MED ORDER — CHLORHEXIDINE GLUCONATE 0.12 % MT SOLN
OROMUCOSAL | Status: AC
  Filled 2024-07-04: qty 15

## 2024-07-04 MED ORDER — CEFAZOLIN SODIUM-DEXTROSE 2-4 GM/100ML-% IV SOLN
INTRAVENOUS | Status: AC
  Filled 2024-07-04: qty 100

## 2024-07-04 MED ORDER — 0.9 % SODIUM CHLORIDE (POUR BTL) OPTIME
TOPICAL | Status: DC | PRN
  Administered 2024-07-04: 500 mL

## 2024-07-04 MED ORDER — BUPIVACAINE-EPINEPHRINE 0.25% -1:200000 IJ SOLN
INTRAMUSCULAR | Status: DC | PRN
  Administered 2024-07-04: 30 mL

## 2024-07-04 MED ORDER — ONDANSETRON HCL 4 MG/2ML IJ SOLN
INTRAMUSCULAR | Status: DC | PRN
  Administered 2024-07-04: 4 mg via INTRAVENOUS

## 2024-07-04 MED ORDER — ONDANSETRON HCL 4 MG/2ML IJ SOLN
INTRAMUSCULAR | Status: AC
  Filled 2024-07-04: qty 2

## 2024-07-04 MED ORDER — BUPIVACAINE-EPINEPHRINE (PF) 0.25% -1:200000 IJ SOLN
INTRAMUSCULAR | Status: AC
  Filled 2024-07-04: qty 30

## 2024-07-04 MED ORDER — EPHEDRINE SULFATE-NACL 50-0.9 MG/10ML-% IV SOSY
PREFILLED_SYRINGE | INTRAVENOUS | Status: DC | PRN
  Administered 2024-07-04: 10 mg via INTRAVENOUS

## 2024-07-04 MED ORDER — LIDOCAINE HCL (PF) 2 % IJ SOLN
INTRAMUSCULAR | Status: AC
  Filled 2024-07-04: qty 5

## 2024-07-04 MED ORDER — LACTATED RINGERS IV SOLN: INTRAVENOUS | Status: DC

## 2024-07-04 MED ORDER — FENTANYL CITRATE (PF) 100 MCG/2ML IJ SOLN
INTRAMUSCULAR | Status: DC | PRN
  Administered 2024-07-04 (×2): 50 ug via INTRAVENOUS

## 2024-07-04 MED ORDER — KETOROLAC TROMETHAMINE 30 MG/ML IJ SOLN
INTRAMUSCULAR | Status: DC | PRN
  Administered 2024-07-04: 15 mg via INTRAVENOUS

## 2024-07-04 MED ORDER — HYDROCODONE-ACETAMINOPHEN 5-325 MG PO TABS: 1.0000 | ORAL_TABLET | ORAL | 0 refills | Status: AC | PRN

## 2024-07-04 MED ORDER — FENTANYL CITRATE (PF) 100 MCG/2ML IJ SOLN
INTRAMUSCULAR | Status: AC
  Filled 2024-07-04: qty 2

## 2024-07-04 MED ORDER — ALBUTEROL SULFATE HFA 108 (90 BASE) MCG/ACT IN AERS
INHALATION_SPRAY | RESPIRATORY_TRACT | Status: AC
  Filled 2024-07-04: qty 6.7

## 2024-07-04 MED ORDER — OXYCODONE HCL 5 MG PO TABS: 5.0000 mg | ORAL_TABLET | Freq: Once | ORAL | Status: AC | PRN

## 2024-07-04 MED ORDER — GLYCOPYRROLATE 0.2 MG/ML IJ SOLN
INTRAMUSCULAR | Status: DC | PRN
  Administered 2024-07-04: .1 mg via INTRAVENOUS

## 2024-07-04 MED ORDER — ROCURONIUM BROMIDE 100 MG/10ML IV SOLN
INTRAVENOUS | Status: DC | PRN
  Administered 2024-07-04: 50 mg via INTRAVENOUS
  Administered 2024-07-04: 10 mg via INTRAVENOUS

## 2024-07-04 MED ORDER — DEXAMETHASONE SOD PHOSPHATE PF 10 MG/ML IJ SOLN
INTRAMUSCULAR | Status: AC
  Filled 2024-07-04: qty 1

## 2024-07-04 MED ORDER — PROPOFOL 10 MG/ML IV BOLUS
INTRAVENOUS | Status: AC
  Filled 2024-07-04: qty 20

## 2024-07-04 MED ORDER — DEXAMETHASONE SOD PHOSPHATE PF 10 MG/ML IJ SOLN
INTRAMUSCULAR | Status: DC | PRN
  Administered 2024-07-04: 10 mg via INTRAVENOUS

## 2024-07-04 MED ORDER — FENTANYL CITRATE (PF) 100 MCG/2ML IJ SOLN
25.0000 ug | INTRAMUSCULAR | Status: DC | PRN
  Administered 2024-07-04 (×2): 25 ug via INTRAVENOUS

## 2024-07-04 MED ORDER — LIDOCAINE HCL (CARDIAC) PF 100 MG/5ML IV SOSY
PREFILLED_SYRINGE | INTRAVENOUS | Status: DC | PRN
  Administered 2024-07-04: 100 mg via INTRAVENOUS

## 2024-07-04 NOTE — Transfer of Care (Signed)
 Immediate Anesthesia Transfer of Care Note  Patient: Taylor Shaw  Procedure(s) Performed: REPAIR, HERNIA, VENTRAL, ROBOT-ASSISTED (Abdomen)  Patient Location: PACU  Anesthesia Type:General  Level of Consciousness: drowsy  Airway & Oxygen Therapy: Patient Spontanous Breathing and Patient connected to face mask oxygen  Post-op Assessment: Report given to RN, Post -op Vital signs reviewed and stable, and Patient moving all extremities X 4  Post vital signs: Reviewed and stable  Last Vitals:  Vitals Value Taken Time  BP 142/63 07/04/24 12:48  Temp    Pulse 63 07/04/24 12:48  Resp 16 07/04/24 12:48  SpO2 100 % 07/04/24 12:48  Vitals shown include unfiled device data.  Last Pain:  Vitals:   07/04/24 0913  TempSrc: Temporal  PainSc: 0-No pain         Complications: No notable events documented.

## 2024-07-04 NOTE — Anesthesia Preprocedure Evaluation (Addendum)
"                                    Anesthesia Evaluation  Patient identified by MRN, date of birth, ID band Patient awake    Reviewed: Allergy & Precautions, NPO status , Patient's Chart, lab work & pertinent test results  History of Anesthesia Complications Negative for: history of anesthetic complications  Airway Mallampati: III   Neck ROM: Full    Dental  (+) Missing   Pulmonary neg pulmonary ROS   Pulmonary exam normal breath sounds clear to auscultation       Cardiovascular hypertension, + CAD  Normal cardiovascular exam Rhythm:Regular Rate:Normal     Neuro/Psych negative neurological ROS     GI/Hepatic ,GERD  ,,  Endo/Other  diabetes, Type 2Hypothyroidism  Obesity   Renal/GU negative Renal ROS     Musculoskeletal  (+) Arthritis ,    Abdominal   Peds  Hematology negative hematology ROS (+)   Anesthesia Other Findings   Reproductive/Obstetrics                              Anesthesia Physical Anesthesia Plan  ASA: 2  Anesthesia Plan: General   Post-op Pain Management:    Induction: Intravenous  PONV Risk Score and Plan: 3 and Ondansetron , Dexamethasone  and Treatment may vary due to age or medical condition  Airway Management Planned: Oral ETT  Additional Equipment:   Intra-op Plan:   Post-operative Plan: Extubation in OR  Informed Consent: I have reviewed the patients History and Physical, chart, labs and discussed the procedure including the risks, benefits and alternatives for the proposed anesthesia with the patient or authorized representative who has indicated his/her understanding and acceptance.     Dental advisory given  Plan Discussed with: CRNA  Anesthesia Plan Comments: (Patient consented for risks of anesthesia including but not limited to:  - adverse reactions to medications - damage to eyes, teeth, lips or other oral mucosa - nerve damage due to positioning  - sore throat or  hoarseness - damage to heart, brain, nerves, lungs, other parts of body or loss of life  Informed patient about role of CRNA in peri- and intra-operative care.  Patient voiced understanding.)         Anesthesia Quick Evaluation  "

## 2024-07-04 NOTE — Op Note (Signed)
 Robotic assisted laparoscopic ventral hernia Repair IPOM using 11.4cms ventralight BARD mesh   Pre-operative Diagnosis:  ventral hernia   Post-operative Diagnosis: same   Surgeon: Laneta Luna, MD FACS   Anesthesia: Gen. with endotracheal tube     Findings: 3 cm incarcerated ( omentum) ventral hernia   Estimated Blood Loss: 5cc         Complications: none             Procedure Details  The patient was seen again in the Holding Room. The benefits, complications, treatment options, and expected outcomes were discussed with the patient. The risks of bleeding, infection, recurrence of symptoms, failure to resolve symptoms, bowel injury, mesh placement, mesh infection, any of which could require further surgery were reviewed with the patient. The likelihood of improving the patient's symptoms with return to their baseline status is good.  The patient and/or family concurred with the proposed plan, giving informed consent.  The patient was taken to Operating Room, identified and the procedure verified.  A Time Out was held and the above information confirmed.   Prior to the induction of general anesthesia, antibiotic prophylaxis was administered. VTE prophylaxis was in place. General endotracheal anesthesia was then administered and tolerated well. After the induction, the abdomen was prepped with Chloraprep and draped in the sterile fashion. The patient was positioned in the supine position.   We used a left upper quadrant incision and Veres needle was inserted at Palmer's point, saline test was appropriate as well as pressures,  pneumoperitoneum obtained.  No hemodynamic compromise.   a total of 3 8 mm ports were placed under direct visualization.  I visualized the hernia with omentum chronically incarcerated.  The defect measured 3 cm.,  At this time I went ahead and inserted the  11.4 cms ventralight mesh.   The robot was brought to the surgical field and docked in the standard fashion.   We made sure that all instrumentation was kept under direct vision at all times and there was no collision between the arms.  I scrubbed out and went to the console. The omentum was reduced to the abdominal cavity by dividing it from the hernia sac using electrocautery and scissors, there was no bowel near the omentum and I felt confident using energy.   Falciform was taken down with electrocautery to allow adequate mesh placement.  I Confirmed and measured the defect again.   Using a 0 stratafix suture we closed the ventral defect primarily incorporating the sac.   Using the stratafix in a chandelier fashion mesh was brought towards the abdominal wall.   The mesh was secured circumferentially to the abdominal wall using 2 stratafix in  a double crown standard fashion.   The mesh layed really nicely against the  abdominal wall. A second look laparoscopy revealed no evidence of intra-abdominal injury.    All the needles and foreign objects were removed under direct visualization.  The instruments were removed and the robot was undocked.  I scrubbed back in, The laparoscopic ports were removed under direct visualization and the pneumoperitoneum was deflated.   Incisions were closed with  4-0 Monocryl  And the fascial sutures approximated in the standard fashion Dermabond was used to coat the skin. Marcaine   was used to inject all the incision sites. Patient tolerated procedure well and there were no immediate complications. Needle and laparotomy counts were correct

## 2024-07-04 NOTE — Anesthesia Procedure Notes (Addendum)
 Procedure Name: Intubation Date/Time: 07/04/2024 10:56 AM  Performed by: Myra Lawless, CRNAPre-anesthesia Checklist: Patient identified, Patient being monitored, Timeout performed, Emergency Drugs available and Suction available Patient Re-evaluated:Patient Re-evaluated prior to induction Oxygen Delivery Method: Circle system utilized Preoxygenation: Pre-oxygenation with 100% oxygen Induction Type: IV induction Ventilation: Mask ventilation without difficulty Laryngoscope Size: Mac and 3 Grade View: Grade I Tube type: Oral Tube size: 7.0 mm Number of attempts: 1 Airway Equipment and Method: Stylet Placement Confirmation: ETT inserted through vocal cords under direct vision, positive ETCO2 and breath sounds checked- equal and bilateral Secured at: 21 cm Tube secured with: Tape Dental Injury: Teeth and Oropharynx as per pre-operative assessment

## 2024-07-04 NOTE — Interval H&P Note (Signed)
 History and Physical Interval Note:  07/04/2024 9:11 AM  Taylor Shaw  has presented today for surgery, with the diagnosis of Incarcerated ventral hernia 3 cm.  The various methods of treatment have been discussed with the patient and family. After consideration of risks, benefits and other options for treatment, the patient has consented to  Procedures: REPAIR, HERNIA, VENTRAL, ROBOT-ASSISTED (N/A) as a surgical intervention.  The patient's history has been reviewed, patient examined, no change in status, stable for surgery.  I have reviewed the patient's chart and labs.  Questions were answered to the patient's satisfaction.     Cosme Jacob F Ariani Seier

## 2024-07-04 NOTE — Interval H&P Note (Signed)
 History and Physical Interval Note:  07/04/2024 9:28 AM  Taylor Shaw  has presented today for surgery, with the diagnosis of Incarcerated ventral hernia 3 cm.  The various methods of treatment have been discussed with the patient and family. After consideration of risks, benefits and other options for treatment, the patient has consented to  Procedures: REPAIR, HERNIA, VENTRAL, ROBOT-ASSISTED (N/A) as a surgical intervention.  The patient's history has been reviewed, patient examined, no change in status, stable for surgery.  I have reviewed the patient's chart and labs.  Questions were answered to the patient's satisfaction.     Aadan Chenier F Orhan Mayorga

## 2024-07-04 NOTE — Discharge Instructions (Addendum)
 Laparoscopic Surgery for Belly Hernias: What to Know After After the procedure, it's common to have pain, discomfort, or soreness. Follow these instructions at home: Medicines Take your medicines only as told. You may need to take steps to help treat or prevent trouble pooping (constipation), such as: Taking medicines to help you poop. Eating foods high in fiber, like beans, whole grains, and fresh fruits and vegetables. Drinking more fluids as told. Ask your health care provider if it's safe to drive or use machines while taking your medicine. Incision care  Take care of the cuts in your belly as told. Make sure you: Wash your hands with soap and water for at least 20 seconds before and after you change your bandage. If you can't use soap and water, use hand sanitizer. Change your bandage. Leave stitches or skin glue alone. Leave tape strips alone unless you're told to take them off. You may trim the edges of the tape strips if they curl up. Check the cuts on your belly every day for signs of infection. Check for: More redness, swelling, or pain. More fluid or blood. Warmth. Pus or a bad smell. Activity Rest as told. Get up to take short walks at least every 2 hours during the day. This helps you breathe better and keeps your blood flowing. Ask for help if you feel weak or unsteady. Do not take baths, swim, or use a hot tub until you're told it's OK. Ask if you can shower. Ask if it's OK for you to lift. If you were given a sedative, do not drive or use machines until you're told it's safe. A sedative can make you sleepy. Ask what things are safe for you to do at home. Ask when you can go back to work or school. General instructions Hold a pillow over your belly when you cough or sneeze. This helps with pain. Wear a binder around your belly as told by your provider. Do not smoke, vape, or use nicotine or tobacco. Wear compression stockings to reduce swelling and help prevent blood  clots in your legs. You may be asked to continue to do deep breathing exercises at home. This will help to prevent a lung infection. Contact a health care provider if: You have any signs of infection. You have pain that gets worse or does not get better with medicine. You throw up or you feel like throwing up. You have a cough. You have not pooped in 3 days. You are not able to pee. You have a fever. Get help right away if: You have very bad pain in your belly. You throw up every time you eat or drink. You have redness, warmth, or pain in your leg. You have chest pain. You have trouble breathing. These symptoms may be an emergency. Call 911 right away. Do not wait to see if the symptoms will go away. Do not drive yourself to the hospital. This information is not intended to replace advice given to you by your health care provider. Make sure you discuss any questions you have with your health care provider. Document Revised: 11/28/2022 Document Reviewed: 11/28/2022 Elsevier Patient Education  2024 [employer].

## 2024-07-07 ENCOUNTER — Encounter: Payer: Self-pay | Admitting: Surgery

## 2024-08-07 ENCOUNTER — Ambulatory Visit

## 2024-12-10 ENCOUNTER — Ambulatory Visit
